# Patient Record
Sex: Female | Born: 1960
Health system: Southern US, Community
[De-identification: ages and names within clinical notes are randomized; demographics above are authoritative.]

## PROBLEM LIST (undated history)

## (undated) DIAGNOSIS — R739 Hyperglycemia, unspecified: Secondary | ICD-10-CM

## (undated) DIAGNOSIS — T7840XA Allergy, unspecified, initial encounter: Secondary | ICD-10-CM

## (undated) DIAGNOSIS — G4730 Sleep apnea, unspecified: Secondary | ICD-10-CM

## (undated) DIAGNOSIS — Z8679 Personal history of other diseases of the circulatory system: Secondary | ICD-10-CM

## (undated) DIAGNOSIS — F419 Anxiety disorder, unspecified: Secondary | ICD-10-CM

## (undated) DIAGNOSIS — I10 Essential (primary) hypertension: Secondary | ICD-10-CM

## (undated) DIAGNOSIS — Z5189 Encounter for other specified aftercare: Secondary | ICD-10-CM

## (undated) DIAGNOSIS — A630 Anogenital (venereal) warts: Secondary | ICD-10-CM

## (undated) DIAGNOSIS — D649 Anemia, unspecified: Secondary | ICD-10-CM

## (undated) HISTORY — DX: Essential (primary) hypertension: I10

## (undated) HISTORY — DX: Hyperglycemia, unspecified: R73.9

## (undated) HISTORY — PX: ABDOMINAL HYSTERECTOMY: SHX81

## (undated) HISTORY — DX: Sleep apnea, unspecified: G47.30

## (undated) HISTORY — DX: Personal history of other diseases of the circulatory system: Z86.79

## (undated) HISTORY — DX: Allergy, unspecified, initial encounter: T78.40XA

## (undated) HISTORY — PX: CARPAL TUNNEL RELEASE: SHX101

## (undated) HISTORY — PX: CHOLECYSTECTOMY: SHX55

## (undated) HISTORY — DX: Anemia, unspecified: D64.9

## (undated) HISTORY — DX: Encounter for other specified aftercare: Z51.89

## (undated) HISTORY — DX: Anogenital (venereal) warts: A63.0

## (undated) HISTORY — PX: COLONOSCOPY: SHX174

## (undated) HISTORY — PX: BREAST EXCISIONAL BIOPSY: SUR124

## (undated) HISTORY — PX: BREAST SURGERY: SHX581

---

## 2001-01-25 ENCOUNTER — Emergency Department (HOSPITAL_COMMUNITY): Admission: EM | Admit: 2001-01-25 | Discharge: 2001-01-25 | Payer: Self-pay | Admitting: Emergency Medicine

## 2002-02-06 ENCOUNTER — Emergency Department (HOSPITAL_COMMUNITY): Admission: EM | Admit: 2002-02-06 | Discharge: 2002-02-06 | Payer: Self-pay | Admitting: Emergency Medicine

## 2006-06-27 ENCOUNTER — Encounter: Payer: Self-pay | Admitting: Internal Medicine

## 2006-06-27 ENCOUNTER — Ambulatory Visit: Payer: Self-pay | Admitting: Internal Medicine

## 2006-07-03 DIAGNOSIS — I10 Essential (primary) hypertension: Secondary | ICD-10-CM | POA: Insufficient documentation

## 2006-07-03 DIAGNOSIS — I1A0 Resistant hypertension: Secondary | ICD-10-CM | POA: Insufficient documentation

## 2006-07-09 ENCOUNTER — Encounter (INDEPENDENT_AMBULATORY_CARE_PROVIDER_SITE_OTHER): Payer: Self-pay | Admitting: *Deleted

## 2006-07-09 LAB — CONVERTED CEMR LAB
Chloride: 104 meq/L (ref 96–112)
MCV: 89.6 fL (ref 78.0–100.0)
Platelets: 344 10*3/uL (ref 150–400)
Potassium: 3.8 meq/L (ref 3.5–5.3)
RDW: 13.8 % (ref 11.5–14.0)
TSH: 1.578 microintl units/mL (ref 0.350–5.50)

## 2006-07-25 ENCOUNTER — Ambulatory Visit: Payer: Self-pay | Admitting: Internal Medicine

## 2006-07-25 LAB — CONVERTED CEMR LAB
Bilirubin Urine: NEGATIVE
Glucose, Urine, Semiquant: NEGATIVE
Nitrite: NEGATIVE
Protein, U semiquant: NEGATIVE
Specific Gravity, Urine: 1.02
Urobilinogen, UA: NEGATIVE
WBC Urine, dipstick: NEGATIVE

## 2006-07-30 ENCOUNTER — Telehealth (INDEPENDENT_AMBULATORY_CARE_PROVIDER_SITE_OTHER): Payer: Self-pay | Admitting: *Deleted

## 2006-08-01 ENCOUNTER — Encounter: Payer: Self-pay | Admitting: Internal Medicine

## 2006-08-01 ENCOUNTER — Encounter (INDEPENDENT_AMBULATORY_CARE_PROVIDER_SITE_OTHER): Payer: Self-pay | Admitting: *Deleted

## 2006-08-08 ENCOUNTER — Ambulatory Visit: Payer: Self-pay | Admitting: Internal Medicine

## 2006-08-20 ENCOUNTER — Ambulatory Visit: Payer: Self-pay | Admitting: Internal Medicine

## 2006-08-22 ENCOUNTER — Encounter (INDEPENDENT_AMBULATORY_CARE_PROVIDER_SITE_OTHER): Payer: Self-pay | Admitting: *Deleted

## 2006-08-22 LAB — CONVERTED CEMR LAB
CO2: 30 meq/L (ref 19–32)
GFR calc Af Amer: 86 mL/min

## 2007-01-09 ENCOUNTER — Encounter: Payer: Self-pay | Admitting: Internal Medicine

## 2007-02-16 ENCOUNTER — Encounter: Payer: Self-pay | Admitting: Internal Medicine

## 2007-03-18 ENCOUNTER — Telehealth (INDEPENDENT_AMBULATORY_CARE_PROVIDER_SITE_OTHER): Payer: Self-pay | Admitting: *Deleted

## 2007-03-18 ENCOUNTER — Encounter (INDEPENDENT_AMBULATORY_CARE_PROVIDER_SITE_OTHER): Payer: Self-pay | Admitting: *Deleted

## 2007-04-15 ENCOUNTER — Ambulatory Visit: Payer: Self-pay | Admitting: Internal Medicine

## 2007-04-15 ENCOUNTER — Encounter: Payer: Self-pay | Admitting: Internal Medicine

## 2007-04-15 ENCOUNTER — Other Ambulatory Visit: Admission: RE | Admit: 2007-04-15 | Discharge: 2007-04-15 | Payer: Self-pay | Admitting: Internal Medicine

## 2007-04-15 DIAGNOSIS — A630 Anogenital (venereal) warts: Secondary | ICD-10-CM

## 2007-04-15 LAB — CONVERTED CEMR LAB
Ketones, urine, test strip: NEGATIVE
Protein, U semiquant: NEGATIVE
pH: 7.5

## 2007-04-16 ENCOUNTER — Encounter: Payer: Self-pay | Admitting: Internal Medicine

## 2007-04-20 ENCOUNTER — Encounter: Payer: Self-pay | Admitting: Internal Medicine

## 2007-04-20 ENCOUNTER — Encounter (INDEPENDENT_AMBULATORY_CARE_PROVIDER_SITE_OTHER): Payer: Self-pay | Admitting: *Deleted

## 2007-04-20 ENCOUNTER — Telehealth (INDEPENDENT_AMBULATORY_CARE_PROVIDER_SITE_OTHER): Payer: Self-pay | Admitting: *Deleted

## 2007-04-22 ENCOUNTER — Encounter (INDEPENDENT_AMBULATORY_CARE_PROVIDER_SITE_OTHER): Payer: Self-pay | Admitting: *Deleted

## 2007-04-28 ENCOUNTER — Encounter: Payer: Self-pay | Admitting: Internal Medicine

## 2007-04-29 ENCOUNTER — Ambulatory Visit: Payer: Self-pay | Admitting: Internal Medicine

## 2007-04-29 ENCOUNTER — Encounter (INDEPENDENT_AMBULATORY_CARE_PROVIDER_SITE_OTHER): Payer: Self-pay | Admitting: *Deleted

## 2007-05-05 ENCOUNTER — Telehealth (INDEPENDENT_AMBULATORY_CARE_PROVIDER_SITE_OTHER): Payer: Self-pay | Admitting: *Deleted

## 2007-05-05 DIAGNOSIS — D649 Anemia, unspecified: Secondary | ICD-10-CM

## 2007-05-05 LAB — CONVERTED CEMR LAB
ALT: 14 units/L (ref 0–35)
AST: 15 units/L (ref 0–37)
Basophils Absolute: 0 10*3/uL (ref 0.0–0.1)
Basophils Relative: 0.3 % (ref 0.0–1.0)
Bilirubin, Direct: 0.1 mg/dL (ref 0.0–0.3)
Creatinine, Ser: 0.7 mg/dL (ref 0.4–1.2)
Eosinophils Absolute: 0.1 10*3/uL (ref 0.0–0.6)
Eosinophils Relative: 1.7 % (ref 0.0–5.0)
GFR calc non Af Amer: 95 mL/min
HDL: 39.4 mg/dL (ref >39.0)
Hemoglobin: 11.1 g/dL — ABNORMAL LOW (ref 12.0–15.0)
MCV: 91.9 fL (ref 78.0–100.0)
Potassium: 3.6 meq/L (ref 3.5–5.1)
RBC: 3.7 M/uL — ABNORMAL LOW (ref 3.87–5.11)
RDW: 12.7 % (ref 11.5–14.6)
Total CHOL/HDL Ratio: 4.4
Total Protein: 6.8 g/dL (ref 6.0–8.3)
WBC: 5.1 10*3/uL (ref 4.5–10.5)

## 2007-05-12 ENCOUNTER — Encounter (INDEPENDENT_AMBULATORY_CARE_PROVIDER_SITE_OTHER): Payer: Self-pay | Admitting: *Deleted

## 2007-05-15 ENCOUNTER — Telehealth (INDEPENDENT_AMBULATORY_CARE_PROVIDER_SITE_OTHER): Payer: Self-pay | Admitting: *Deleted

## 2007-05-27 ENCOUNTER — Ambulatory Visit: Payer: Self-pay | Admitting: Internal Medicine

## 2007-06-02 ENCOUNTER — Ambulatory Visit: Payer: Self-pay | Admitting: Gastroenterology

## 2007-06-12 ENCOUNTER — Telehealth: Payer: Self-pay | Admitting: Gastroenterology

## 2007-06-12 DIAGNOSIS — D649 Anemia, unspecified: Secondary | ICD-10-CM

## 2007-06-12 HISTORY — DX: Anemia, unspecified: D64.9

## 2007-06-18 ENCOUNTER — Ambulatory Visit: Payer: Self-pay | Admitting: Gastroenterology

## 2007-06-18 ENCOUNTER — Encounter: Payer: Self-pay | Admitting: Internal Medicine

## 2007-06-18 LAB — HM COLONOSCOPY

## 2007-06-19 ENCOUNTER — Encounter: Payer: Self-pay | Admitting: Gastroenterology

## 2007-06-22 ENCOUNTER — Telehealth: Payer: Self-pay | Admitting: Gastroenterology

## 2007-07-10 ENCOUNTER — Ambulatory Visit: Payer: Self-pay | Admitting: Internal Medicine

## 2007-07-13 LAB — CONVERTED CEMR LAB
BUN: 9 mg/dL (ref 6–23)
Chloride: 107 meq/L (ref 96–112)
Creatinine, Ser: 0.9 mg/dL (ref 0.40–1.20)
Potassium: 3.9 meq/L (ref 3.5–5.3)

## 2007-07-16 ENCOUNTER — Ambulatory Visit: Payer: Self-pay | Admitting: Internal Medicine

## 2007-07-16 LAB — CONVERTED CEMR LAB
Fecal Occult Blood: NEGATIVE
OCCULT 1: NEGATIVE
OCCULT 3: NEGATIVE
OCCULT 5: NEGATIVE

## 2007-12-03 ENCOUNTER — Telehealth (INDEPENDENT_AMBULATORY_CARE_PROVIDER_SITE_OTHER): Payer: Self-pay | Admitting: *Deleted

## 2007-12-11 ENCOUNTER — Ambulatory Visit: Payer: Self-pay | Admitting: Internal Medicine

## 2007-12-11 ENCOUNTER — Observation Stay (HOSPITAL_COMMUNITY): Admission: EM | Admit: 2007-12-11 | Discharge: 2007-12-12 | Payer: Self-pay | Admitting: Internal Medicine

## 2007-12-25 ENCOUNTER — Encounter: Payer: Self-pay | Admitting: Internal Medicine

## 2007-12-28 ENCOUNTER — Ambulatory Visit: Payer: Self-pay | Admitting: Internal Medicine

## 2007-12-28 DIAGNOSIS — R7309 Other abnormal glucose: Secondary | ICD-10-CM

## 2007-12-29 ENCOUNTER — Encounter (INDEPENDENT_AMBULATORY_CARE_PROVIDER_SITE_OTHER): Payer: Self-pay | Admitting: *Deleted

## 2007-12-31 ENCOUNTER — Telehealth (INDEPENDENT_AMBULATORY_CARE_PROVIDER_SITE_OTHER): Payer: Self-pay | Admitting: *Deleted

## 2007-12-31 LAB — CONVERTED CEMR LAB
CO2: 30 meq/L (ref 19–32)
Calcium: 9.2 mg/dL (ref 8.4–10.5)
Creatinine, Ser: 0.8 mg/dL (ref 0.4–1.2)
Hgb A1c MFr Bld: 5.7 % (ref 4.6–6.0)

## 2008-01-14 ENCOUNTER — Ambulatory Visit: Payer: Self-pay

## 2008-01-14 ENCOUNTER — Encounter: Payer: Self-pay | Admitting: Internal Medicine

## 2008-03-01 ENCOUNTER — Encounter: Payer: Self-pay | Admitting: Internal Medicine

## 2008-03-03 ENCOUNTER — Telehealth (INDEPENDENT_AMBULATORY_CARE_PROVIDER_SITE_OTHER): Payer: Self-pay | Admitting: *Deleted

## 2008-03-14 ENCOUNTER — Encounter (INDEPENDENT_AMBULATORY_CARE_PROVIDER_SITE_OTHER): Payer: Self-pay | Admitting: *Deleted

## 2008-04-26 ENCOUNTER — Ambulatory Visit: Payer: Self-pay | Admitting: Internal Medicine

## 2008-04-26 DIAGNOSIS — J309 Allergic rhinitis, unspecified: Secondary | ICD-10-CM | POA: Insufficient documentation

## 2008-05-27 ENCOUNTER — Ambulatory Visit: Payer: Self-pay | Admitting: Internal Medicine

## 2008-07-04 ENCOUNTER — Ambulatory Visit: Payer: Self-pay

## 2008-08-02 ENCOUNTER — Encounter: Payer: Self-pay | Admitting: Internal Medicine

## 2008-08-02 ENCOUNTER — Encounter (INDEPENDENT_AMBULATORY_CARE_PROVIDER_SITE_OTHER): Payer: Self-pay | Admitting: *Deleted

## 2008-08-02 ENCOUNTER — Ambulatory Visit: Payer: Self-pay | Admitting: Internal Medicine

## 2008-08-02 DIAGNOSIS — M79609 Pain in unspecified limb: Secondary | ICD-10-CM

## 2008-08-02 DIAGNOSIS — R05 Cough: Secondary | ICD-10-CM

## 2008-08-03 ENCOUNTER — Telehealth: Payer: Self-pay | Admitting: Internal Medicine

## 2008-08-08 LAB — CONVERTED CEMR LAB
ALT: 12 units/L (ref 0–35)
Hemoglobin: 11.9 g/dL — ABNORMAL LOW (ref 12.0–15.0)
Hgb A1c MFr Bld: 5.6 % (ref 4.6–6.5)
Lymphocytes Relative: 38.9 % (ref 12.0–46.0)
MCHC: 34.1 g/dL (ref 30.0–36.0)
Monocytes Absolute: 0.4 10*3/uL (ref 0.1–1.0)
Neutro Abs: 2.7 10*3/uL (ref 1.4–7.7)
Neutrophils Relative %: 51.5 % (ref 43.0–77.0)
RDW: 12.8 % (ref 11.5–14.6)
Total Bilirubin: 0.9 mg/dL (ref 0.3–1.2)
Total CHOL/HDL Ratio: 4
Total Protein: 7.3 g/dL (ref 6.0–8.3)
Triglycerides: 50 mg/dL (ref 0.0–149.0)

## 2008-08-09 ENCOUNTER — Ambulatory Visit: Payer: Self-pay | Admitting: Internal Medicine

## 2008-08-12 LAB — CONVERTED CEMR LAB: Folate: 5.9 ng/mL

## 2008-08-17 ENCOUNTER — Ambulatory Visit: Payer: Self-pay | Admitting: Emergency Medicine

## 2008-08-17 DIAGNOSIS — R042 Hemoptysis: Secondary | ICD-10-CM | POA: Insufficient documentation

## 2008-08-19 ENCOUNTER — Ambulatory Visit: Payer: Self-pay | Admitting: Internal Medicine

## 2008-08-22 ENCOUNTER — Telehealth (INDEPENDENT_AMBULATORY_CARE_PROVIDER_SITE_OTHER): Payer: Self-pay | Admitting: *Deleted

## 2008-08-22 LAB — CONVERTED CEMR LAB
BUN: 13 mg/dL (ref 6–23)
Chloride: 104 meq/L (ref 96–112)
Creatinine, Ser: 0.8 mg/dL (ref 0.4–1.2)
GFR calc non Af Amer: 97.77 mL/min (ref >60)

## 2008-09-02 ENCOUNTER — Ambulatory Visit: Admission: RE | Admit: 2008-09-02 | Discharge: 2008-09-02 | Payer: Self-pay | Admitting: Emergency Medicine

## 2008-09-02 ENCOUNTER — Encounter: Payer: Self-pay | Admitting: Emergency Medicine

## 2008-09-02 ENCOUNTER — Ambulatory Visit: Payer: Self-pay | Admitting: Emergency Medicine

## 2008-09-08 ENCOUNTER — Telehealth: Payer: Self-pay | Admitting: Emergency Medicine

## 2008-09-09 ENCOUNTER — Telehealth (INDEPENDENT_AMBULATORY_CARE_PROVIDER_SITE_OTHER): Payer: Self-pay | Admitting: *Deleted

## 2008-09-09 ENCOUNTER — Encounter: Payer: Self-pay | Admitting: Emergency Medicine

## 2009-01-03 ENCOUNTER — Telehealth (INDEPENDENT_AMBULATORY_CARE_PROVIDER_SITE_OTHER): Payer: Self-pay | Admitting: *Deleted

## 2009-01-17 ENCOUNTER — Ambulatory Visit (HOSPITAL_BASED_OUTPATIENT_CLINIC_OR_DEPARTMENT_OTHER): Admission: RE | Admit: 2009-01-17 | Discharge: 2009-01-17 | Payer: Self-pay | Admitting: Internal Medicine

## 2009-01-17 ENCOUNTER — Ambulatory Visit: Payer: Self-pay | Admitting: Family

## 2009-01-17 ENCOUNTER — Ambulatory Visit: Payer: Self-pay | Admitting: Radiology

## 2009-01-17 ENCOUNTER — Telehealth: Payer: Self-pay | Admitting: Family

## 2009-01-17 DIAGNOSIS — R1011 Right upper quadrant pain: Secondary | ICD-10-CM | POA: Insufficient documentation

## 2009-01-17 LAB — CONVERTED CEMR LAB
AST: 14 units/L (ref 0–37)
Albumin: 4.3 g/dL (ref 3.5–5.2)
Alkaline Phosphatase: 64 units/L (ref 39–117)
Bilirubin, Direct: 0.1 mg/dL (ref 0.0–0.3)
Lipase: 21 units/L (ref 0–75)
MCHC: 32.6 g/dL (ref 30.0–36.0)
MCV: 89.3 fL (ref 78.0–100.0)
Platelets: 348 10*3/uL (ref 150–400)
RBC: 4.02 M/uL (ref 3.87–5.11)
RDW: 13.2 % (ref 11.5–15.5)

## 2009-01-20 ENCOUNTER — Telehealth: Payer: Self-pay | Admitting: Family

## 2009-02-17 ENCOUNTER — Encounter (INDEPENDENT_AMBULATORY_CARE_PROVIDER_SITE_OTHER): Payer: Self-pay | Admitting: *Deleted

## 2009-02-17 ENCOUNTER — Ambulatory Visit: Payer: Self-pay | Admitting: Family

## 2009-02-17 LAB — CONVERTED CEMR LAB
Folate: 15.6 ng/mL
Glucose, Bld: 87 mg/dL (ref 70–99)
Iron: 59 ug/dL (ref 42–145)
Potassium: 4.1 meq/L (ref 3.5–5.3)
Saturation Ratios: 20 % (ref 20–55)
Sodium: 142 meq/L (ref 135–145)
TIBC: 302 ug/dL (ref 250–470)
UIBC: 243 ug/dL
Vitamin B-12: 396 pg/mL (ref 211–911)

## 2009-02-20 ENCOUNTER — Encounter: Payer: Self-pay | Admitting: Family

## 2009-03-02 ENCOUNTER — Ambulatory Visit: Payer: Self-pay | Admitting: Family

## 2009-03-02 LAB — CONVERTED CEMR LAB: Fecal Occult Bld: NEGATIVE

## 2009-04-05 ENCOUNTER — Emergency Department (HOSPITAL_COMMUNITY): Admission: EM | Admit: 2009-04-05 | Discharge: 2009-04-05 | Payer: Self-pay | Admitting: Emergency Medicine

## 2009-04-05 ENCOUNTER — Ambulatory Visit: Payer: Self-pay | Admitting: Family

## 2009-04-05 DIAGNOSIS — R0789 Other chest pain: Secondary | ICD-10-CM | POA: Insufficient documentation

## 2009-06-01 ENCOUNTER — Telehealth: Payer: Self-pay | Admitting: Family

## 2009-06-26 ENCOUNTER — Encounter: Payer: Self-pay | Admitting: Family

## 2009-07-26 ENCOUNTER — Ambulatory Visit (HOSPITAL_COMMUNITY): Admission: RE | Admit: 2009-07-26 | Discharge: 2009-07-26 | Payer: Self-pay | Admitting: Surgery

## 2009-08-07 ENCOUNTER — Telehealth: Payer: Self-pay | Admitting: Family

## 2009-08-09 ENCOUNTER — Ambulatory Visit: Payer: Self-pay | Admitting: Family

## 2009-08-09 DIAGNOSIS — Z9089 Acquired absence of other organs: Secondary | ICD-10-CM | POA: Insufficient documentation

## 2009-08-09 DIAGNOSIS — R040 Epistaxis: Secondary | ICD-10-CM | POA: Insufficient documentation

## 2009-08-09 LAB — CONVERTED CEMR LAB
Basophils Absolute: 0 10*3/uL (ref 0.0–0.1)
Hemoglobin: 11.6 g/dL — ABNORMAL LOW (ref 12.0–15.0)
Monocytes Relative: 7 % (ref 3–12)
Neutro Abs: 3.3 10*3/uL (ref 1.7–7.7)
Neutrophils Relative %: 47 % (ref 43–77)
Platelets: 376 10*3/uL (ref 150–400)
RBC: 4 M/uL (ref 3.87–5.11)

## 2009-08-10 ENCOUNTER — Telehealth: Payer: Self-pay | Admitting: Family

## 2010-03-13 NOTE — Letter (Signed)
   [facility] [address] [place], [place]  [address] Phone: [phone]      February 20, 2009   [facility] [address] [place], [place] [address]  RE:  LAB RESULTS  Dear  Ms. Gaspard,  The following is an interpretation of your most recent lab tests.  Please take note of any instructions provided or changes to medications that have resulted from your lab work.  ELECTROLYTES:  Good - no changes needed    DIABETIC STUDIES:  Excellent - no changes needed Blood Glucose: 87   HgbA1C: 5.6    You anemia studies are normal.   Sincerely Yours,    Lemont Fillers FNP

## 2010-03-13 NOTE — Progress Notes (Signed)
Summary: disability form; needs appt  Phone Note Outgoing Call   Summary of Call: Please call patient and let her know thatwe have a disability form for her.  Please have her arrange a follow up visit here in the office this week.   Initial call taken by: Lemont Fillers FNP,  August 07, 2009 8:58 AM  Follow-up for Phone Call        Unable to reach pt. at home. Could not leave message on voicemail.  Mervin Kung CMA  August 07, 2009 9:39 AM   Additional Follow-up for Phone Call Additional follow up Details #1::        Spoke with pt and scheduled f/u with Lovinia Snare for 08/09/09 @ 10:30am.  Mervin Kung CMA  August 08, 2009 1:26 PM

## 2010-03-13 NOTE — Assessment & Plan Note (Signed)
Summary: FOLLOWUP/JS   Vital Signs:  Patient profile:   50 year old female Weight:      243 pounds Pulse rate:   85 / minute BP sitting:   188 / 118  (right arm)  Vitals Entered By: Doristine Devoid (February 17, 2009 2:22 PM) CC: f/u    Primary Care Provider:  Dr. Drue Novel  CC:  f/u .  History of Present Illness: Ms Jennifer [name] is a 50 year old female who presents today for follow up.  Was in the first week of December with c/o RUQ pain.  Abdominal US noted cholilithiasis without ductal dilatation.   Patient reports to me that she is no longer experiencing abdominal pain  Anemia- Patient tells me this is not a new problem.  She is s/p hysterectomy.  Patient had a colonoscopy 2 years ago- diverticulosis. Denies black stools or BRBPR  Allergies: 1)  ! Codeine  Review of Systems       no melena, no BRBPR  Physical Exam  General:  Well-developed,well-nourished,in no acute distress; alert,appropriate and cooperative throughout examination Head:  Normocephalic and atraumatic without obvious abnormalities. No apparent alopecia or balding. Ears:  External ear exam shows no significant lesions or deformities.  Otoscopic examination reveals clear canals, tympanic membranes are intact bilaterally without bulging, retraction, inflammation or discharge. Hearing is grossly normal bilaterally. Neck:  No deformities, masses, or tenderness noted. Lungs:  Normal respiratory effort, chest expands symmetrically. Lungs are clear to auscultation, no crackles or wheezes. Heart:  Normal rate and regular rhythm. S1 and S2 normal without gallop, murmur, click, rub or other extra sounds.   Impression & Recommendations:  Problem # 1:  ABDOMINAL PAIN, RIGHT UPPER QUADRANT (ICD-789.01) Assessment Improved Pain resolved.  Patient instructed to call if symptoms recur.  Would consider HIDA scan at that time.  Problem # 2:  ANEMIA (ICD-285.9) Assessment: Comment Only  will order iFOB, will also order anemia  panel,  S/p hysterectomy  Orders: T-Iron ([id number]) T-Iron Binding Capacity (TIBC) ([address]) TLB-B12 + Folate Pnl ([id number]/OZD)  Problem # 3:  HYPERTENSION (ICD-401.9) Assessment: Deteriorated  Patient did not take meds this am- urged patient to take immediately upon returning home.  Compliance reinforced.  Follow up in 2 weeks Her updated medication list for this problem includes:    Carvedilol 25 Mg Tabs (Carvedilol) .Marland Kitchen... 1 by mouth two times a day    Cozaar 50 Mg Tabs (Losartan potassium) .Marland Kitchen... 1 by mouth once daily    Hydrochlorothiazide 25 Mg Tabs (Hydrochlorothiazide) .Marland Kitchen... 1 by mouth every am  BP today: 188/118 Prior BP: 140/90 (01/17/2009)  Prior 10 Yr Risk Heart Disease: 8 % (07/04/2008)  Labs Reviewed: K+: 3.7 (08/19/2008) Creat: : 0.8 (08/19/2008)   Chol: 193 (08/02/2008)   HDL: 49.20 (08/02/2008)   LDL: 134 (08/02/2008)   TG: 50.0 (08/02/2008)  Orders: T-Basic Metabolic Panel [phone])  Complete Medication List: 1)  Carvedilol 25 Mg Tabs (Carvedilol) .Marland Kitchen.. 1 by mouth two times a day 2)  Cozaar 50 Mg Tabs (Losartan potassium) .Marland Kitchen.. 1 by mouth once daily 3)  Hydrochlorothiazide 25 Mg Tabs (Hydrochlorothiazide) .Marland Kitchen.. 1 by mouth every am 4)  Klor-con M20 20 Meq Cr-tabs (Potassium chloride crys cr) .Marland Kitchen.. 1 by mouth qd 5)  Alavert 10 Mg Tabs (Loratadine) .... As needed 6)  Womens Multivitamin Plus Tabs (Multiple vitamins-minerals) .... One tablet by mouth daily  Patient Instructions: 1)  Please complete the stool cards, 2)  Take your blood pressure meds EVERY DAY! 3)  Please schedule  a follow-up appointment in 2 weeks.

## 2010-03-13 NOTE — Progress Notes (Signed)
Summary: STD form  Phone Note Other Incoming   Summary of Call: Pt brought short term disability form from [employer] for laparoscopic cholecystectomy on 07/26/09. Pt was seen, form completed and faxed to [phone] @ 4:26pm. Form sent to be scanned.  Mervin Kung CMA  August 10, 2009 4:39 PM

## 2010-03-13 NOTE — Letter (Signed)
   [facility] [address] [place], [place] [address] [phone]    March 02, 2009   [facility] [address] [place], [place] [address]  RE:  LAB RESULTS  Dear  Ms. Demauro,  The following is an interpretation of your most recent lab tests.  Please take note of any instructions provided or changes to medications that have resulted from your lab work.  Hemoccult Test for blood in stool:  negative      Sincerely Yours,    Lemont Fillers FNP

## 2010-03-13 NOTE — Assessment & Plan Note (Signed)
Summary: chest tightning, H/A- jr   Vital Signs:  Patient profile:   50 year old female Menstrual status:  hysterectomy Weight:      248.88 pounds BMI:     45.32 Temp:     98.1 degrees F oral Pulse rate:   68 / minute Pulse rhythm:   regular Resp:     16 per minute BP sitting:   140 / 90  (right arm) Cuff size:   large CC: room 4  Rt sided chest pressure Is Patient Diabetic? No Comments Also wants evaluation of top left foot pain for approximately 3 weeks. No injuries that pt is aware of.   Primary Care Provider:  Dr. Drue [name]  CC:  room 4  Rt sided chest pressure.  History of Present Illness: Jennifer [name] is a 50 year old female who presents today with c/o chest pressure since she got out of bed this AM.   Feels like "something is sitting on my chest".  Notes that pain is substernal or slightly right of substernal.  Pain radiates to her back when she moves her right shoulder.  Pain is not made worse by exertion.  Denies shortness of breath.  She took an aleve this am which helped temporarily.  Rates current pain as 5/10.  Of note, she had a neg stress test 5/10.  Notes mild nausea without associated vomitting.  Notes + HA at the top of her head daily x 1 week.  Denies cough or associated fever.    Preventive Screening-Counseling & Management  Alcohol-Tobacco     Smoking Status: quit  Allergies: 1)  ! Codeine  Past History:  Past Medical History: Last updated: 08/02/2008 11-09 CHEST PAIN, neg ECHO  , neg stress test 06-2008 HYPERTENSION   ALLERGIC RHINITIS  HYPERGLYCEMIA, BORDERLINE .Marland KitchenMarland KitchenA1c 5.7 12-2007 h/o CONDYLOMA ACUMINATUM   anemia--  Cscope 5-09 : tics, neg hemocults 6-09  Past Surgical History: Last updated: 08/02/2008 Caesarean section FIBROID TUMOR REMOVED-RIGHT BREAST age 49  Hysterectomy, no oophorectomy   Family History: Last updated: 08/02/2008 CAD - F, uncle, GM, sis HTN - GM, F, sis, 3 bro DM - F, M, MGM, PGM, aunts, sis colon Ca - no breast Ca -  aunt stroke - PGM, MGM, uncle, bro hypothyroid - aunt hyperthyroid - aunt prostate Ca - F glaucoma - GM  Social History: Last updated: 08/17/2008 Pt. is single with 3 children Former Smoker quit aprox 2006.  Pt smoked 1/2ppd x51yrs. Alcohol use-no Drug use-no Regular exercise-no caffeine - 1 by mouth once daily Pt works for [employer].  Risk Factors: Exercise: no (08/02/2008)  Risk Factors: Smoking Status: quit (04/05/2009)  Family History: Reviewed history from 08/02/2008 and no changes required. CAD - F, uncle, GM, sis HTN - GM, F, sis, 3 bro DM - F, M, MGM, PGM, aunts, sis colon Ca - no breast Ca - aunt stroke - PGM, MGM, uncle, bro hypothyroid - aunt hyperthyroid - aunt prostate Ca - F glaucoma - GM  Social History: Reviewed history from 08/17/2008 and no changes required. Pt. is single with 3 children Former Smoker quit aprox 2006.  Pt smoked 1/2ppd x12yrs. Alcohol use-no Drug use-no Regular exercise-no caffeine - 1 by mouth once daily Pt works for [employer].  Physical Exam  General:  Well-developed,well-nourished,in no acute distress; alert,appropriate and cooperative throughout examination Head:  Normocephalic and atraumatic without obvious abnormalities. No apparent alopecia or balding. Eyes:  PERRLA Mouth:  Oral mucosa and oropharynx without lesions or exudates.  Teeth  in good repair. Neck:  No deformities, masses, or tenderness noted. Chest Wall:  + tenderness to palpation- greatest over right breast. Lungs:  Normal respiratory effort, chest expands symmetrically. Lungs are clear to auscultation, no crackles or wheezes. Heart:  Normal rate and regular rhythm. S1 and S2 normal without gallop, murmur, click, rub or other extra sounds. Abdomen:  Bowel sounds positive,abdomen soft and non-tender without masses, organomegaly or hernias noted. No RUQ tenderness   Impression & Recommendations:  Problem # 1:  CHEST PAIN, ATYPICAL  (ICD-786.59) Assessment New Patient with several risk factors for CAD,  (HTN, obesity, age, hyperglycemia).  Reviewed EKG and compared to previous EKG-  Patient appears to have mild ST elevation in leads V3 andV4 which is new.  [facility] exercise stress test May 2010.  I spoke with Dr. Kerry Hough with [facility] and he has requested that patient be sent through the emergency department for further evaluation in ED prior to admission.  I also discussed the case with [place] cardiology at Dr. Benetta Spar request.  Spoke with Dr. Riley Kill and notified him that patient was [facility] ED.  Greater than 25 minutes were spent, greater than 50% of time spent on counselling and coordination of her care.    Complete Medication List: 1)  Carvedilol 25 Mg Tabs (Carvedilol) .Marland Kitchen.. 1 by mouth two times a day 2)  Cozaar 50 Mg Tabs (Losartan potassium) .Marland Kitchen.. 1 by mouth once daily 3)  Hydrochlorothiazide 25 Mg Tabs (Hydrochlorothiazide) .Marland Kitchen.. 1 by mouth every am 4)  Klor-con M20 20 Meq Cr-tabs (Potassium chloride crys cr) .Marland Kitchen.. 1 by mouth qd 5)  Alavert 10 Mg Tabs (Loratadine) .... As needed 6)  Womens Multivitamin Plus Tabs (Multiple vitamins-minerals) .... One tablet by mouth daily 7)  Aleve 220 Mg Tabs (Naproxen sodium) .... As needed  Other Orders: EKG w/ Interpretation (93000)  Patient Instructions: 1)  Please go directly to the [facility] Emergency Department for further evaluation.    Current Allergies (reviewed today): ! CODEINE

## 2010-03-13 NOTE — Letter (Signed)
Summary: [facility]: Immunoassay Fecal Occult Blood (iFOB) Order Psychologist, counselling at [facility]  [address]   [place], [place] [address]   Phone: [phone]  Fax: [phone]      [facility]: Immunoassay Fecal Occult Blood (iFOB) Order Form   February 17, 2009 MRN: [id number]   BRUNETTE LAVALLE 21-Dec-1960   Physicican Name:________Melissa O'Sullivan,FNP______  Diagnosis Code:__________285.9________________      Doristine Devoid

## 2010-03-13 NOTE — Consult Note (Signed)
Summary: [facility]   Imported By: Lanelle Bal 07/18/2009 12:56:00  _____________________________________________________________________  External Attachment:    Type:   Image     Comment:   External Document

## 2010-03-13 NOTE — Assessment & Plan Note (Signed)
Summary: needs disability form completed / tf,cma--Rm 4   Vital Signs:  Patient profile:   50 year old female Menstrual status:  hysterectomy Height:      62.25 inches Weight:      236 pounds BMI:     42.97 Temp:     98.9 degrees F oral Pulse rate:   66 / minute Pulse rhythm:   regular Resp:     16 per minute BP sitting:   140 / 96  (right arm) Cuff size:   large  Vitals Entered By: Mervin Kung CMA (August 09, 2009 10:30 AM)  CC: Room 4   Pt has short term disability papers to be completed. Also had nosebleed last night and this a.m. Is Patient Diabetic? No   Primary Care Provider:  Dr. Drue Novel  CC:  Room 4   Pt has short term disability papers to be completed. Also had nosebleed last night and this a.m..  History of Present Illness: Jennifer [name] is a 50 year old female who presents today for follow up from her laparoscopic cholecystectomy which was perfomed n 6/15.   Patient has appointment on 08/18/09 with surgery and plans to return to work after that time.  She works as in Administrator, arts.  Today she notes 2 episodes of epistaxis in the last 24 hours.  Denies any recent sinus issues.   She ultimately stopped the bleeding with use of an ice pack and pressure.  The first episode of bleeding lasted 45 minutes.   Surgeon's office has a copy of form.    Patient was previously being followed at the [facility] office.  She tells me that she would like to transfer her care to the [facility] office.  Allergies: 1)  ! Codeine  Past History:  Past Medical History: Last updated: 08/02/2008 11-09 CHEST PAIN, neg ECHO  , neg stress test 06-2008 HYPERTENSION   ALLERGIC RHINITIS  HYPERGLYCEMIA, BORDERLINE .Marland KitchenMarland KitchenA1c 5.7 12-2007 h/o CONDYLOMA ACUMINATUM   anemia--  Cscope 5-09 : tics, neg hemocults 6-09  Past Surgical History: Last updated: 08/02/2008 Caesarean section FIBROID TUMOR REMOVED-RIGHT BREAST age 41  Hysterectomy, no oophorectomy   Family History: Last updated:  08/02/2008 CAD - F, uncle, GM, sis HTN - GM, F, sis, 3 bro DM - F, M, MGM, PGM, aunts, sis colon Ca - no breast Ca - aunt stroke - PGM, MGM, uncle, bro hypothyroid - aunt hyperthyroid - aunt prostate Ca - F glaucoma - GM  Social History: Last updated: 08/17/2008 Pt. is single with 3 children Former Smoker quit aprox 2006.  Pt smoked 1/2ppd x3yrs. Alcohol use-no Drug use-no Regular exercise-no caffeine - 1 by mouth once daily Pt works for [employer].  Risk Factors: Exercise: no (08/02/2008)  Risk Factors: Smoking Status: quit (04/05/2009)  Physical Exam  General:  Well-developed,well-nourished,in no acute distress; alert,appropriate and cooperative throughout examination Lungs:  Normal respiratory effort, chest expands symmetrically. Lungs are clear to auscultation, no crackles or wheezes. Heart:  Normal rate and regular rhythm. S1 and S2 normal without gallop, murmur, click, rub or other extra sounds. Abdomen:  Bowel sounds positive,abdomen soft and non-tender without masses, organomegaly or hernias noted.  Surgical incisions well approximated, clean, no drainage or erythema is noted. Extremities:  No clubbing, cyanosis, edema, or deformity noted with normal full range of motion of all joints.     Impression & Recommendations:  Problem # 1:  EPISTAXIS (ICD-784.7) Assessment New Recommended humidfier and nasal saline spray.  Patient instructed to go to  the ED if she develops recurrent epistaxis which does not resolve with pressure.  Check CBC.  Will consider referral to ENT if this becomes a recurrent problem for patient.    Problem # 2:  CHOLECYSTECTOMY, LAPAROSCOPIC, HX OF (ICD-V45.79)  Clinically well post-op.  Short term disability form filled for her employer.  Orders: Form Completion ([address]409)  Complete Medication List: 1)  Carvedilol 25 Mg Tabs (Carvedilol) .Marland Kitchen.. 1 by mouth two times a day 2)  Cozaar 50 Mg Tabs (Losartan potassium) .Marland Kitchen.. 1 by mouth once  daily 3)  Hydrochlorothiazide 25 Mg Tabs (Hydrochlorothiazide) .Marland Kitchen.. 1 by mouth every am 4)  Klor-con M20 20 Meq Cr-tabs (Potassium chloride crys cr) .Marland Kitchen.. 1 by mouth qd 5)  Alavert 10 Mg Tabs (Loratadine) .... As needed 6)  Womens Multivitamin Plus Tabs (Multiple vitamins-minerals) .... One tablet by mouth daily 7)  Aleve 220 Mg Tabs (Naproxen sodium) .... As needed  Other Orders: T-CBC w/Diff [phone])  Patient Instructions: 1)  Go to the ER if you develop recurrent nose bleed that you are unable to stop. 2)  Try adding a humidifier to your bedroom. 3)  Use a nasal saline spray twice daily to both nostrils. 4)  Follow up in 2 months. 5)  We will forward your disability paperwork to your employer.  Prescriptions: KLOR-CON M20 20 MEQ CR-TABS (POTASSIUM CHLORIDE CRYS CR) 1 by mouth qd  #90 x 1   Entered and Authorized by:   Lemont Fillers FNP   Signed by:   Lemont Fillers FNP on 08/09/2009   Method used:   Electronically to        [facility] [address] (retail)       [phone]       [place], [place]  [address]       Ph: [phone]       Fax: [phone]   RxID:   [id number] HYDROCHLOROTHIAZIDE 25 MG  TABS (HYDROCHLOROTHIAZIDE) 1 by mouth every AM  #30 Each x 3   Entered and Authorized by:   Lemont Fillers FNP   Signed by:   Lemont Fillers FNP on 08/09/2009   Method used:   Electronically to        [facility] [address] (retail)       [phone]       [place], [place]  [address]       Ph: [phone]       Fax: [phone]   RxID:   [id number] COZAAR 50 MG TABS (LOSARTAN POTASSIUM) 1 by mouth once daily  #30 x 3   Entered and Authorized by:   Lemont Fillers FNP   Signed by:   Lemont Fillers FNP on 08/09/2009   Method used:   Electronically to        [facility] [address] (retail)       [phone]       [place], [place]  [address]       Ph: [phone]       Fax: [phone]   RxID:   [id number] CARVEDILOL 25 MG TABS (CARVEDILOL) 1 by mouth two times a day  #60 x 3   Entered and Authorized by:   Lemont Fillers FNP   Signed by:   Lemont Fillers FNP on 08/09/2009   Method  used:   Tax adviser to        [facility] (retail)       [phone]       [place], [place]  [address]       Ph: [phone]       Fax: [phone]   RxID:   [id number]   Current Allergies (reviewed today): ! CODEINE

## 2010-03-13 NOTE — Progress Notes (Signed)
  Phone Note Call from Patient   Caller: Patient Details for Reason: SURGERY Summary of Call: PTwould like to schedule her GB   surgery -   [phone]     still having lot of pain on & off Initial call taken by: Darral Dash,  June 01, 2009 11:45 AM  Follow-up for Phone Call        Pt notes that she has had abdominal pain RUQ last night with headache.  Still with some mild abdominal discomfort.  Reports pain is consistent with pain during  previous gall bladder flare-ups.  Pt with known history of cholelithiasis.  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  I instructed patient to go to the ER over the weekend if worsening abdominal pain, nausea, vomitting of fever.  She verbalizes understanding.  Follow-up by: Lemont Fillers FNP,  June 01, 2009 12:10 PM

## 2010-04-27 ENCOUNTER — Encounter: Payer: Self-pay | Admitting: Family

## 2010-04-27 ENCOUNTER — Ambulatory Visit (INDEPENDENT_AMBULATORY_CARE_PROVIDER_SITE_OTHER): Payer: Managed Care, Other (non HMO) | Admitting: Family

## 2010-04-27 DIAGNOSIS — N951 Menopausal and female climacteric states: Secondary | ICD-10-CM

## 2010-04-27 DIAGNOSIS — I10 Essential (primary) hypertension: Secondary | ICD-10-CM

## 2010-04-27 LAB — CONVERTED CEMR LAB
BUN: 16 mg/dL (ref 6–23)
Chloride: 104 meq/L (ref 96–112)
FSH: 71 milliintl units/mL
Glucose, Bld: 111 mg/dL — ABNORMAL HIGH (ref 70–99)
LH: 34.1 milliintl units/mL
Potassium: 3.6 meq/L (ref 3.5–5.3)
Sodium: 140 meq/L (ref 135–145)

## 2010-04-28 LAB — BASIC METABOLIC PANEL
BUN: 16 mg/dL (ref 6–23)
Creat: 0.77 mg/dL (ref 0.40–1.20)
Glucose, Bld: 111 mg/dL — ABNORMAL HIGH (ref 70–99)
Potassium: 3.6 mEq/L (ref 3.5–5.3)

## 2010-04-28 LAB — FOLLICLE STIMULATING HORMONE: FSH: 71 m[IU]/mL

## 2010-04-30 LAB — COMPREHENSIVE METABOLIC PANEL
Albumin: 3.7 g/dL (ref 3.5–5.2)
BUN: 11 mg/dL (ref 6–23)
Chloride: 104 mEq/L (ref 96–112)
Creatinine, Ser: 0.91 mg/dL (ref 0.4–1.2)
Glucose, Bld: 117 mg/dL — ABNORMAL HIGH (ref 70–99)
Sodium: 140 mEq/L (ref 135–145)

## 2010-04-30 LAB — DIFFERENTIAL
Basophils Absolute: 0 10*3/uL (ref 0.0–0.1)
Monocytes Absolute: 0.5 10*3/uL (ref 0.1–1.0)
Monocytes Relative: 9 % (ref 3–12)

## 2010-04-30 LAB — CBC: MCHC: 33.7 g/dL (ref 30.0–36.0)

## 2010-04-30 LAB — SURGICAL PCR SCREEN: MRSA, PCR: NEGATIVE

## 2010-05-01 NOTE — Assessment & Plan Note (Signed)
Summary: f/u of blood pressure--rm 5   Vital Signs:  Patient profile:   50 year old female Menstrual status:  hysterectomy Height:      62.25 inches Weight:      236 pounds BMI:     42.97 Temp:     98.1 degrees F oral Pulse rate:   84 / minute Pulse rhythm:   regular Resp:     16 per minute BP sitting:   162 / 96  (right arm) Cuff size:   large  Vitals Entered By: Mervin Kung CMA Duncan Dull) (April 27, 2010 1:28 PM) CC: Pt here for follow up of blood pressure. , Hypertension Management Is Patient Diabetic? No Pain Assessment Patient in pain? no        Primary Care Provider:  Lemont Fillers FNP  CC:  Pt here for follow up of blood pressure.  and Hypertension Management.  History of Present Illness: Pt is a 50 year old female who presents today for follow up of her blood pressure.  HTN- taking HCTZ, and carvidilol- but ran out of the carvedilol 3 days ago.  Never started the cozaar  Hot flashes- night sweats.  These symptom started getting worse last 1 year.    Hypertension History:      She denies chest pain, palpitations, and peripheral edema.  Further comments include: occasional HA's when she is hungry.  Marland Kitchen        Positive major cardiovascular risk factors include hypertension and family history for ischemic heart disease (males less than 44 years old).  Negative major cardiovascular risk factors include female age less than 67 years old, no history of diabetes or hyperlipidemia, and non-tobacco-user status.        Further assessment for target organ damage reveals no history of ASHD, cardiac end-organ damage (CHF/LVH), stroke/TIA, peripheral vascular disease, renal insufficiency, or hypertensive retinopathy.     Preventive Screening-Counseling & Management  Alcohol-Tobacco     Smoking Status: quit     Year Quit: 2006  Allergies: 1)  ! Codeine  Family History: CAD - F, uncle, GM, sis HTN - GM, F, sis, 3 bro DM - F, M, MGM, PGM, aunts, sis colon Ca -  no breast Ca - aunt- dad's side. was 29 years old when she diedl cousin- died of cervical cancer at age 5. stroke - PGM, MGM, uncle, bro hypothyroid - aunt hyperthyroid - aunt prostate Ca - F glaucoma - GM  Review of Systems       see history of present illness  Physical Exam  General:  Well-developed,well-nourished,in no acute distress; alert,appropriate and cooperative throughout examination Head:  Normocephalic and atraumatic without obvious abnormalities. No apparent alopecia or balding. Lungs:  Normal respiratory effort, chest expands symmetrically. Lungs are clear to auscultation, no crackles or wheezes. Heart:  Normal rate and regular rhythm. S1 and S2 normal without gallop, murmur, click, rub or other extra sounds. Extremities:  No peripheral edema   Impression & Recommendations:  Problem # 1:  HYPERTENSION (ICD-401.9) Assessment Deteriorated Plan to resume carvedilol. She reports that she never started the Cozaar. We can consider resuming this at a later date if necessary. Followup in one month. The following medications were removed from the medication list:    Cozaar 50 Mg Tabs (Losartan potassium) .Marland Kitchen... 1 by mouth once daily Her updated medication list for this problem includes:    Carvedilol 25 Mg Tabs (Carvedilol) .Marland Kitchen... 1 by mouth two times a day    Hydrochlorothiazide 25 Mg  Tabs (Hydrochlorothiazide) .Marland Kitchen... 1 by mouth every am  Orders: TLB-BMP (Basic Metabolic Panel-BMET) (80048-METABOL)  BP today: 162/96 Prior BP: 140/96 (08/09/2009)  10 Yr Risk Heart Disease: 13 % Prior 10 Yr Risk Heart Disease: 8 % (07/04/2008)  Labs Reviewed: K+: 4.1 (02/17/2009) Creat: : 0.84 (02/17/2009)   Chol: 193 (08/02/2008)   HDL: 49.20 (08/02/2008)   LDL: 134 (08/02/2008)   TG: 50.0 (08/02/2008)  Problem # 2:  HOT FLASHES (ICD-627.2) Assessment: New plan to check labs as below. She has a significant family history of early breast cancer. I would be reluctant to treat her with  HRT. Orders: T-FSH [id number]) T-LH [id number]) T-Estradiol [id number])  Complete Medication List: 1)  Carvedilol 25 Mg Tabs (Carvedilol) .Marland Kitchen.. 1 by mouth two times a day 2)  Hydrochlorothiazide 25 Mg Tabs (Hydrochlorothiazide) .Marland Kitchen.. 1 by mouth every am 3)  Klor-con M20 20 Meq Cr-tabs (Potassium chloride crys cr) .Marland Kitchen.. 1 by mouth qd 4)  Alavert 10 Mg Tabs (Loratadine) .... As needed 5)  Womens Multivitamin Plus Tabs (Multiple vitamins-minerals) .... One tablet by mouth daily 6)  Aleve 220 Mg Tabs (Naproxen sodium) .... As needed  Hypertension Assessment/Plan:      The patient's hypertensive risk group is category B: At least one risk factor (excluding diabetes) with no target organ damage.  Her calculated 10 year risk of coronary heart disease is 13 %.  Today's blood pressure is 162/96.  Her blood pressure goal is < 140/90.  Patient Instructions: 1)  Please complete your lab work on the first floor today. 2)  Please schedule a follow up appointment in 1 month for a complete physical.  Come fasting to this appointment.  Prescriptions: KLOR-CON M20 20 MEQ CR-TABS (POTASSIUM CHLORIDE CRYS CR) 1 by mouth qd  #30 x 1   Entered and Authorized by:   Lemont Fillers FNP   Signed by:   Lemont Fillers FNP on 04/27/2010   Method used:   Electronically to        [facility]* (retail)       [phone]       [place], [place]  [address]       Ph: [phone]       Fax: [phone]   RxID:   [id number] CARVEDILOL 25 MG TABS (CARVEDILOL) 1 by mouth two times a day  #60 Each x 0   Entered and Authorized by:   Lemont Fillers FNP   Signed by:   Lemont Fillers FNP on 04/27/2010   Method used:   Electronically to        [facility] (retail)       [phone]       [place], [place]  [address]       Ph: [phone]       Fax: [phone]   RxID:   [id number] HYDROCHLOROTHIAZIDE 25 MG  TABS  (HYDROCHLOROTHIAZIDE) 1 by mouth every AM  #30 Each x 3   Entered and Authorized by:   Lemont Fillers FNP   Signed by:   Lemont Fillers FNP on 04/27/2010   Method used:   Electronically to        [facility] (retail)       [phone]       [place], [place]  [address]  Ph: [phone]       Fax: [phone]   RxID:   [id number]    Orders Added: 1)  TLB-BMP (Basic Metabolic Panel-BMET) [80048-METABOL] 2)  T-FSH [62952-84132] 3)  T-LH [83002-23680] 4)  T-Estradiol [82670-82425] 5)  Est. Patient Level III [[address]]    Current Allergies (reviewed today): ! CODEINE

## 2010-05-02 LAB — POCT CARDIAC MARKERS: CKMB, poc: <1 ng/mL — ABNORMAL LOW (ref 1.0–8.0)

## 2010-05-02 LAB — POCT I-STAT, CHEM 8
BUN: 12 mg/dL (ref 6–23)
Creatinine, Ser: 0.8 mg/dL (ref 0.4–1.2)
Glucose, Bld: 118 mg/dL — ABNORMAL HIGH (ref 70–99)
Hemoglobin: 12.6 g/dL (ref 12.0–15.0)
TCO2: 29 mmol/L (ref 0–100)

## 2010-05-06 LAB — ESTRADIOL, FREE

## 2010-05-20 ENCOUNTER — Telehealth: Payer: Self-pay | Admitting: Family

## 2010-06-01 ENCOUNTER — Encounter: Payer: Self-pay | Admitting: Family

## 2010-06-04 ENCOUNTER — Encounter: Payer: Self-pay | Admitting: Family

## 2010-06-04 ENCOUNTER — Ambulatory Visit (INDEPENDENT_AMBULATORY_CARE_PROVIDER_SITE_OTHER): Payer: Managed Care, Other (non HMO) | Admitting: Family

## 2010-06-04 ENCOUNTER — Ambulatory Visit (HOSPITAL_BASED_OUTPATIENT_CLINIC_OR_DEPARTMENT_OTHER)
Admission: RE | Admit: 2010-06-04 | Discharge: 2010-06-04 | Disposition: A | Discharge status: Home / Self Care | Payer: Managed Care, Other (non HMO) | Source: Ambulatory Visit | Attending: Family | Admitting: Family

## 2010-06-04 DIAGNOSIS — R232 Flushing: Secondary | ICD-10-CM

## 2010-06-04 DIAGNOSIS — M25539 Pain in unspecified wrist: Secondary | ICD-10-CM

## 2010-06-04 DIAGNOSIS — Z1231 Encounter for screening mammogram for malignant neoplasm of breast: Secondary | ICD-10-CM | POA: Insufficient documentation

## 2010-06-04 DIAGNOSIS — M2550 Pain in unspecified joint: Secondary | ICD-10-CM | POA: Insufficient documentation

## 2010-06-04 DIAGNOSIS — Z0000 Encounter for general adult medical examination without abnormal findings: Secondary | ICD-10-CM

## 2010-06-04 DIAGNOSIS — N951 Menopausal and female climacteric states: Secondary | ICD-10-CM

## 2010-06-04 DIAGNOSIS — M25579 Pain in unspecified ankle and joints of unspecified foot: Secondary | ICD-10-CM

## 2010-06-04 LAB — CBC WITH DIFFERENTIAL/PLATELET
Basophils Absolute: 0 10*3/uL (ref 0.0–0.1)
Basophils Relative: 0 % (ref 0–1)
HCT: 37.5 % (ref 36.0–46.0)
Lymphocytes Relative: 37 % (ref 12–46)
MCHC: 32.8 g/dL (ref 30.0–36.0)
Monocytes Absolute: 0.4 10*3/uL (ref 0.1–1.0)
Neutro Abs: 3.2 10*3/uL (ref 1.7–7.7)
Neutrophils Relative %: 54 % (ref 43–77)
Platelets: 325 10*3/uL (ref 150–400)
RDW: 12.9 % (ref 11.5–15.5)
WBC: 5.9 10*3/uL (ref 4.0–10.5)

## 2010-06-04 LAB — SEDIMENTATION RATE: Sed Rate: 21 mm/hr (ref 0–22)

## 2010-06-04 MED ORDER — CITALOPRAM HYDROBROMIDE 10 MG PO TABS: 10.0000 mg | ORAL_TABLET | Freq: Every day | ORAL | Status: DC

## 2010-06-04 MED ORDER — POTASSIUM CHLORIDE CRYS ER 20 MEQ PO TBCR: 20.0000 meq | EXTENDED_RELEASE_TABLET | Freq: Every day | ORAL | Status: DC

## 2010-06-04 MED ORDER — CARVEDILOL 25 MG PO TABS: 25.0000 mg | ORAL_TABLET | Freq: Two times a day (BID) | ORAL | Status: DC

## 2010-06-04 MED ORDER — HYDROCHLOROTHIAZIDE 25 MG PO TABS: 25.0000 mg | ORAL_TABLET | ORAL | Status: DC

## 2010-06-04 NOTE — Progress Notes (Signed)
Subjective:    Patient ID: Jennifer [name], female    DOB: [birth year], 50 y.o.   MRN: [id number]  HPI  Preventive-  Exercise- she reports that she dances on Sundays.  Last tetanus 2008.  Last colonoscopy was in 2009.  Last mammogram 2009. She is s/p partial hysterectomy.  Hot Flashes-She continues to have hot flashes.  These can occur at any time of the day.  Joint swelling/pain- notes that she had bilateral wrist and ankle pain/swelling which has resolved. She denies hx of gout.    Review of Systems  Constitutional: Negative for fever, chills, fatigue and unexpected weight change.  HENT: Negative for hearing loss.   Eyes: Negative for visual disturbance.  Respiratory: Negative for cough and chest tightness.   Cardiovascular: Negative for chest pain, palpitations and leg swelling.  Gastrointestinal: Negative for vomiting, diarrhea, constipation and blood in stool.  Genitourinary: Positive for hematuria. Negative for dysuria and frequency.  Musculoskeletal: Positive for back pain and joint swelling.       She reports that she hx of of bilateral wrist and ankle pain and swelling which has resolved.    Skin: Negative for rash.  Neurological: Negative for weakness and numbness.  Hematological: Negative for adenopathy.  Psychiatric/Behavioral:       Denies depression, + anxiety at times- i.e. Traffic, certain people.   Past Medical History  Diagnosis Date  . Chest pain 12/2007    negative ECHO, neg stress test 06/2008  . Hypertension   . Allergy     allergic rhinitis  . Hyperglycemia     borderline. A1c 5.7  12/2007  . Condyloma acuminatum     history of  . Anemia 06/2007    C-scope 06/2007: tics, neg hemocults 07/2007    History   Social History  . Marital Status: Single    Spouse Name: N/A    Number of Children: 3  . Years of Education: N/A   Occupational History  .  [employer]   Social History Main Topics  . Smoking status: Former Smoker -- 0.5 packs/day for  27 years    Types: Cigarettes    Quit date: 02/12/2004  . Smokeless tobacco: Never Used  . Alcohol Use: No  . Drug Use: No  . Sexually Active: Not on file   Other Topics Concern  . Not on file   Social History Narrative   Regular exercise  NoCaffeine--    Past Surgical History  Procedure Date  . Cesarean section   . Abdominal hysterectomy     no oophorectomy  . Breast surgery age 60    Fibroid tumor removed right breast    Family History  Problem Relation Age of Onset  . Diabetes Mother   . Coronary artery disease Father   . Kidney disease Father   . Diabetes Father   . Cancer Father     prostate  . Coronary artery disease Sister   . Kidney disease Sister   . Diabetes Sister   . Kidney disease Brother   . Stroke Brother   . Cancer Paternal Aunt     breast  . Diabetes Maternal Grandmother   . Stroke Maternal Grandmother   . Diabetes Paternal Grandmother   . Stroke Paternal Grandfather   . Coronary artery disease Other     grandmother, uncle  . Kidney disease Other     grandmother  . Stroke Other     uncle  . Thyroid disease Other     aunt--hyperthyroid;  aunt--hypothyroid  . Glaucoma Other     grandmother  . Diabetes Other     aunts  . Cancer Cousin     cervical    Allergies  Allergen Reactions  . Codeine     REACTION: nausea    Current Outpatient Prescriptions on File Prior to Visit  Medication Sig Dispense Refill  . loratadine (ALAVERT) 10 MG tablet Take 10 mg by mouth as needed.        . Multiple Vitamins-Minerals (WOMENS MULTIVITAMIN PLUS) TABS Take 1 tablet by mouth daily.        . naproxen sodium (ANAPROX) 220 MG tablet Take 220 mg by mouth as needed.        Marland Kitchen DISCONTD: carvedilol (COREG) 25 MG tablet Take 25 mg by mouth 2 (two) times daily with a meal.        . DISCONTD: hydrochlorothiazide 25 MG tablet Take 25 mg by mouth every morning.        Marland Kitchen DISCONTD: potassium chloride (KLOR-CON) 20 MEQ packet Take 20 mEq by mouth daily.           BP 120/80  Pulse 72  Temp(Src) 97.8 F (36.6 C) (Oral)  Resp 16  Ht 5' 2.24" (1.581 m)  Wt 237 lb 0.6 oz (107.521 kg)  BMI 43.02 kg/m2       Objective:   Physical Exam  Constitutional: She is oriented to person, place, and time. She appears well-developed.       Pleasant, overweight AA female.  HENT:  Head: Normocephalic and atraumatic.  Eyes: Pupils are equal, round, and reactive to light.  Neck: Normal range of motion. Neck supple. No tracheal deviation present. No thyromegaly present.  Cardiovascular: Normal rate and regular rhythm.  Exam reveals no friction rub.   No murmur heard. Pulmonary/Chest: Effort normal and breath sounds normal.  Abdominal: Soft. Bowel sounds are normal.  Genitourinary: Vagina normal. No breast swelling, tenderness, discharge or bleeding.       Uterus surgically absent.  No adnexal fullness is noted.   Musculoskeletal: Normal range of motion. She exhibits no edema and no tenderness.  Lymphadenopathy:    She has no cervical adenopathy.  Neurological: She is alert and oriented to person, place, and time. She has normal reflexes. No cranial nerve deficit. She exhibits normal muscle tone. Coordination normal.  Skin: Skin is warm.  Psychiatric: She has a normal mood and affect. Her behavior is normal. Thought content normal.          Assessment & Plan:

## 2010-06-04 NOTE — Patient Instructions (Signed)
Please complete your lab work on the first floor. Work hard on diet, exercise and weight loss. Try to exercise at least 20 minutes a day.

## 2010-06-04 NOTE — Assessment & Plan Note (Signed)
Lab work last visit consistent with menopause.  Pt has family hx of early breast CA- will avoid HRT.  Will plan to treat her with low dose citalopram to see if this helps control her symptoms.  Pt was instructed to call if she develops worsening depression while on citalopram.

## 2010-06-04 NOTE — Assessment & Plan Note (Signed)
Pt with episode of joint pain/swelling.  Will check for RA and gout.

## 2010-06-04 NOTE — Assessment & Plan Note (Signed)
Pt counseled on diet, exercise and weight loss.  Immunizations reviewed and up to date.  Pt referred for mammogram.  Colo up to date.

## 2010-06-05 LAB — RHEUMATOID FACTOR: Rhuematoid fact SerPl-aCnc: <10 IU/mL (ref <=14)

## 2010-06-05 LAB — TSH: TSH: 1.803 u[IU]/mL (ref 0.350–4.500)

## 2010-06-06 ENCOUNTER — Encounter: Payer: Self-pay | Admitting: Family

## 2010-06-06 LAB — BASIC METABOLIC PANEL
Calcium: 9.9 mg/dL (ref 8.4–10.5)
Glucose, Bld: 102 mg/dL — ABNORMAL HIGH (ref 70–99)
Potassium: 4.2 mEq/L (ref 3.5–5.3)
Sodium: 140 mEq/L (ref 135–145)

## 2010-06-06 LAB — LIPID PANEL
HDL: 49 mg/dL (ref >39)
LDL Cholesterol: 139 mg/dL — ABNORMAL HIGH (ref 0–99)
Total CHOL/HDL Ratio: 4.1 Ratio
VLDL: 12 mg/dL (ref 0–40)

## 2010-06-06 LAB — HEPATIC FUNCTION PANEL
AST: 17 U/L (ref 0–37)
Albumin: 4.3 g/dL (ref 3.5–5.2)
Alkaline Phosphatase: 65 U/L (ref 39–117)
Indirect Bilirubin: 0.4 mg/dL (ref 0.0–0.9)
Total Bilirubin: 0.5 mg/dL (ref 0.3–1.2)

## 2010-06-26 NOTE — Letter (Signed)
June 02, 2007    Willow Ora, MD  [phone] [place], [place] [address]   RE:  TENNIE, GRUSSING  MRN:  [id number]  /  DOB:  08/27/1960   Dear Dr. Drue Novel:   Upon your kind referral, I had the pleasure of evaluating your patient  and I am pleased to offer my findings.  I saw Jennifer [name] in the office  today.  Enclosed is a copy of my progress note that details my findings  and recommendations.   Thank you for the opportunity to participate in your patient's care.    Sincerely,      Barbette Hair. Arlyce Dice, MD,FACG  Electronically Signed    RDK/MedQ  DD: 06/02/2007  DT: 06/02/2007  Job #: [id number]
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
## 2010-06-26 NOTE — Assessment & Plan Note (Signed)
[facility]                         GASTROENTEROLOGY OFFICE NOTE   Jennifer [name], Jennifer [name]                    MRN:          [id number]  DATE:06/02/2007                            DOB:          01/18/1961    REFERRING PHYSICIAN:  Willow Ora, MD   REASON FOR CONSULTATION:  Anemia.   Jennifer [name] is a 50 year old African American female referred through  the courtesy of Dr. Drue Novel for evaluation.  Routine testing demonstrated a  mild anemia.  On April 29, 2007, hemoglobin was 11.1 and hematocrit 34.  Jennifer [name] only GI compliant is occasional stool incontinence  characterized by noting some stool when she wipes herself after  urinating.  She is unaware of having passed stool.  Bowels are erratic  with alternating constipation and diarrhea.  There is no history of  melena or hematochezia.  She is on no gastric irritants including  nonsteroidals.   PAST MEDICAL HISTORY:  1. Pertinent for hypertension.  2. She is status post hysterectomy and breast surgery.   FAMILY HISTORY:  Positive for mother and sister with diabetes.   MEDICATIONS:  Benazepril, metoprolol and HCTZ.   SHE IS ALLERGIC TO CODEINE.  She neither smokes or drinks.  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   REVIEW OF SYSTEMS:  Is pertinent for abdominal cramps and new headaches,  otherwise negative.   PHYSICAL EXAMINATION:  Pulse 72, blood pressure 134/74, weight 221.  HEENT: EOMI.  PERRLA.  Sclerae are anicteric.  Conjunctivae are pink.  NECK:  Supple without thyromegaly, adenopathy or carotid bruits.  CHEST:  Clear to auscultation and percussion without adventitious  sounds.  CARDIAC:  Regular rhythm; normal S1 S2.  There are no murmurs, gallops  or rubs.  ABDOMEN:  Bowel sounds are normoactive.  Abdomen is soft, nontender and  nondistended.  There are no abdominal masses, tenderness, splenic  enlargement or hepatomegaly.  EXTREMITIES:  Full range of motion.  No  cyanosis, clubbing or edema.  RECTAL:  She has slightly decreased rectal tone, stool is brown and  Hemoccult negative, there are no rectal masses.  SKELETAL:  There are no deformities.  NEUROLOGIC:  She is nonfocal.   IMPRESSION:  1. Normochromic, normocytic anemia.  This may be an anemia of chronic      disease which is mild.  There is no evidence at this time for GI      blood loss.  2. Fecal incontinence.  I suspect this is related to mild irritable      bowel syndrome.   RECOMMENDATIONS:  1. Colonoscopy.  2. Fiber supplementation.     Barbette Hair. Arlyce Dice, MD,FACG  Electronically Signed    RDK/MedQ  DD: 06/02/2007  DT: 06/02/2007  Job #: [id number]   cc:   Willow Ora, MD

## 2010-06-26 NOTE — Op Note (Signed)
Jennifer [name], Jennifer [name]             ACCOUNT NO.:  [id number]   MEDICAL RECORD NO.:  [id number]          PATIENT TYPE:  AMB   LOCATION:  CARD                         FACILITY[facility]   PHYSICIAN:  Leslye Peer, MD    DATE OF BIRTH:  04/12/1960   DATE OF PROCEDURE:  09/02/2008  DATE OF DISCHARGE:                               OPERATIVE REPORT   PROCEDURE:  Fiberoptic bronchoscopy.   OPERATOR:  Leslye Peer, M.D.   INDICATIONS:  Cough and hemoptysis.   CONSENT:  Informed consent was obtained from the patient, and a signed  copy is on her hospital chart.   MEDICATIONS GIVEN:  1. Fentanyl 75 mcg IV in divided doses.  2. Percent 3 mg IV in divided doses.  3. Lidocaine 1% for approximately 10 mL total to the bronchial mucosa.   PROCEDURAL DETAILS:  After informed consent was obtained and an  appropriate time-out was performed, conscious sedation was initiated as  outlined above.  The fiberoptic bronchoscope was introduced through the  left naris without difficulty.  There was cobblestoning of the posterior  pharynx with identification of a distinct, lobulated, slightly vascular  mass just anterior to and right of the epiglottis.  There was a  similarly raised but less lobulated abnormality to the left.  Glottic  biopsies were performed of both areas.  The glottic brushings were also  performed and sent for cytology.  There was no bleeding prior to biopsy.  The trachea was then intubated, and a standard airway exam was  performed.  The trachea, main carina, and bilateral mainstem bronchi  were all within normal limits.  The airway exam was normal on the right  with a clear right upper lobe, right middle lobe, and right lower lobe  airways.  Similarly, the left side exam was normal with a clear left  upper lobe lingular and left lower lobe airways.  There were no  endobronchial lesions or abnormal secretions noted.  No source of  bleeding was identified.  The patient tolerated the  procedure well.  There was no significant bleeding after the biopsies.  She returned to  the recovery room in good condition.   SAMPLES:  1. Glottic biopsies for pathology.  2. Glottic brushings for cytology.  3. Washings for cytology.   PLAN:  I will follow-up the pathology and cytology results with a Ms.  Cathlean Cower when they become available.  We will plan further evaluation and  therapy depending on those results.  She will almost certainly require  to ENT for laryngoscopy and possibly more biopsies, particularly if  these samples are nondiagnostic.      Leslye Peer, MD  Electronically Signed     RSB/MEDQ  D:  09/02/2008  T:  09/03/2008  Job:  [id number]

## 2010-06-26 NOTE — Letter (Signed)
June 02, 2007    Ms. Jennifer [name]   [name], [name]  MRN:  [id number]  /  DOB:  1960-08-02   Dear Jennifer [name]:   It is my pleasure to have treated you recently as a new patient in my  office.  I appreciate your confidence and the opportunity to participate  in your care.   Since I do have a busy inpatient endoscopy schedule and office schedule,  my office hours vary weekly.  I am, however, available for emergency  calls every day through my office.  If I cannot promptly meet an urgent  office appointment, another one of our gastroenterologists will be able  to assist you.   My well-trained staff are prepared to help you at all times.  For  emergencies after office hours, a physician from our gastroenterology  section is always available through my 24-hour answering service.   While you are under my care, I encourage discussion of your questions  and concerns, and I will be happy to return your calls as soon as I am  available.   Once again, I welcome you as a new patient and I look forward to a happy  and healthy relationship.    Sincerely,      Barbette Hair. Arlyce Dice, MD,FACG  Electronically Signed   RDK/MedQ  DD: 06/02/2007  DT: 06/02/2007  Job #: [id number]

## 2010-06-27 ENCOUNTER — Encounter: Payer: Self-pay | Admitting: Family

## 2010-06-29 ENCOUNTER — Encounter: Payer: Self-pay | Admitting: Family

## 2010-06-29 ENCOUNTER — Ambulatory Visit (INDEPENDENT_AMBULATORY_CARE_PROVIDER_SITE_OTHER): Payer: Managed Care, Other (non HMO) | Admitting: Family

## 2010-06-29 DIAGNOSIS — N61 Mastitis without abscess: Secondary | ICD-10-CM

## 2010-06-29 MED ORDER — CEPHALEXIN 500 MG PO CAPS: 500.0000 mg | ORAL_CAPSULE | Freq: Four times a day (QID) | ORAL | Status: AC

## 2010-06-29 NOTE — Patient Instructions (Signed)
Please call if you develop fever, increased pain, swelling, redness of the breast, or if you symptoms are not improved in 48-72 hours.  Follow up in 1 week.

## 2010-06-29 NOTE — Assessment & Plan Note (Signed)
We'll plan to treat with Keflex. And close monitoring. She is instructed to call if increased pain, swelling, fever or redness. Otherwise she is to followup in one week.

## 2010-06-29 NOTE — Progress Notes (Signed)
Subjective:    Patient ID: Jennifer [name], female    DOB: [birth year], 50 y.o.   MRN: [id number]  HPI 3 day history of "knot" on right breast.  "Hard and sore." Denies history of fever or drainage.  Previous history of breast cysts.     Review of Systems See HPI  Past Medical History  Diagnosis Date  . Chest pain 12/2007    negative ECHO, neg stress test 06/2008  . Hypertension   . Allergy     allergic rhinitis  . Hyperglycemia     borderline. A1c 5.7  12/2007  . Condyloma acuminatum     history of  . Anemia 06/2007    C-scope 06/2007: tics, neg hemocults 07/2007    History   Social History  . Marital Status: Single    Spouse Name: N/A    Number of Children: 3  . Years of Education: N/A   Occupational History  .  [employer]   Social History Main Topics  . Smoking status: Former Smoker -- 0.5 packs/day for 27 years    Types: Cigarettes    Quit date: 02/12/2004  . Smokeless tobacco: Never Used  . Alcohol Use: No  . Drug Use: No  . Sexually Active: Not on file   Other Topics Concern  . Not on file   Social History Narrative   Regular exercise  NoCaffeine--    Past Surgical History  Procedure Date  . Cesarean section   . Abdominal hysterectomy     no oophorectomy  . Breast surgery age 37    Fibroid tumor removed right breast    Family History  Problem Relation Age of Onset  . Diabetes Mother   . Coronary artery disease Father   . Kidney disease Father   . Diabetes Father   . Cancer Father     prostate  . Coronary artery disease Sister   . Kidney disease Sister   . Diabetes Sister   . Kidney disease Brother   . Stroke Brother   . Cancer Paternal Aunt     breast  . Diabetes Maternal Grandmother   . Stroke Maternal Grandmother   . Diabetes Paternal Grandmother   . Stroke Paternal Grandfather   . Coronary artery disease Other     grandmother, uncle  . Kidney disease Other     grandmother  . Stroke Other     uncle  . Thyroid disease  Other     aunt--hyperthyroid; aunt--hypothyroid  . Glaucoma Other     grandmother  . Diabetes Other     aunts  . Cancer Cousin     cervical    Allergies  Allergen Reactions  . Codeine     REACTION: nausea    Current Outpatient Prescriptions on File Prior to Visit  Medication Sig Dispense Refill  . carvedilol (COREG) 25 MG tablet Take 1 tablet (25 mg total) by mouth 2 (two) times daily with a meal.  60 tablet  3  . citalopram (CELEXA) 10 MG tablet Take 1 tablet (10 mg total) by mouth daily.  30 tablet  3  . hydrochlorothiazide 25 MG tablet Take 1 tablet (25 mg total) by mouth every morning.  30 tablet  3  . loratadine (ALAVERT) 10 MG tablet Take 10 mg by mouth as needed.        . Multiple Vitamins-Minerals (WOMENS MULTIVITAMIN PLUS) TABS Take 1 tablet by mouth daily.        . naproxen sodium (ANAPROX) 220  MG tablet Take 220 mg by mouth as needed.        . potassium chloride SA (K-DUR,KLOR-CON) 20 MEQ tablet Take 1 tablet (20 mEq total) by mouth daily.  30 tablet  2    BP 124/94  Pulse 78  Temp(Src) 98.2 F (36.8 C) (Oral)  Resp 18  Ht 5\' 2"  (1.575 m)  Wt 236 lb 1.9 oz (107.103 kg)  BMI 43.19 kg/m2        Objective:   Physical Exam General: Awake, alert, and in no acute distress Psychiatric: Calm and pleasant Breast: Right breast is noted to firm tender area of induration at approximately 9:00. Mild associated erythema is noted. Which is approximately equal to a golf ball.       Assessment & Plan:

## 2010-07-06 ENCOUNTER — Ambulatory Visit (INDEPENDENT_AMBULATORY_CARE_PROVIDER_SITE_OTHER): Payer: Managed Care, Other (non HMO) | Admitting: Family

## 2010-07-06 ENCOUNTER — Encounter: Payer: Self-pay | Admitting: Family

## 2010-07-06 DIAGNOSIS — N6310 Unspecified lump in the right breast, unspecified quadrant: Secondary | ICD-10-CM

## 2010-07-06 DIAGNOSIS — R232 Flushing: Secondary | ICD-10-CM

## 2010-07-06 DIAGNOSIS — N63 Unspecified lump in unspecified breast: Secondary | ICD-10-CM

## 2010-07-06 DIAGNOSIS — N61 Mastitis without abscess: Secondary | ICD-10-CM

## 2010-07-06 MED ORDER — CITALOPRAM HYDROBROMIDE 10 MG PO TABS: 10.0000 mg | ORAL_TABLET | Freq: Every day | ORAL | Status: DC

## 2010-07-06 MED ORDER — HYDROCHLOROTHIAZIDE 25 MG PO TABS: 25.0000 mg | ORAL_TABLET | ORAL | Status: DC

## 2010-07-06 NOTE — Assessment & Plan Note (Signed)
Clinically improved-  I do continue to feel a palpable mass in the right breast.  Will refer for diagnostic mammogram.  Pt to complete the keflex.

## 2010-07-06 NOTE — Patient Instructions (Signed)
Complete your keflex. You will be contacted about your diagnostic mammogram. Follow up in 2 weeks.  Take your BP med prior to your visit.

## 2010-07-06 NOTE — Progress Notes (Signed)
Subjective:    Patient ID: Jennifer [name], female    DOB: 17-Sep-1960, 50 y.o.   MRN: [id number]  HPI  Ms.  Jennifer [name] is a 50 yr old female who presents today for follow up of her right breast cellulitis.  She continues keflex.  Denies fever or drainage of galactorrea.  Area is less tender and softer.    Review of Systems See HPI  Past Medical History  Diagnosis Date  . Chest pain 12/2007    negative ECHO, neg stress test 06/2008  . Hypertension   . Allergy     allergic rhinitis  . Hyperglycemia     borderline. A1c 5.7  12/2007  . Condyloma acuminatum     history of  . Anemia 06/2007    C-scope 06/2007: tics, neg hemocults 07/2007    History   Social History  . Marital Status: Single    Spouse Name: N/A    Number of Children: 3  . Years of Education: N/A   Occupational History  .  [employer]   Social History Main Topics  . Smoking status: Former Smoker -- 0.5 packs/day for 27 years    Types: Cigarettes    Quit date: 02/12/2004  . Smokeless tobacco: Never Used  . Alcohol Use: No  . Drug Use: No  . Sexually Active: Not on file   Other Topics Concern  . Not on file   Social History Narrative   Regular exercise  NoCaffeine--    Past Surgical History  Procedure Date  . Cesarean section   . Abdominal hysterectomy     no oophorectomy  . Breast surgery age 52    Fibroid tumor removed right breast    Family History  Problem Relation Age of Onset  . Diabetes Mother   . Coronary artery disease Father   . Kidney disease Father   . Diabetes Father   . Cancer Father     prostate  . Coronary artery disease Sister   . Kidney disease Sister   . Diabetes Sister   . Kidney disease Brother   . Stroke Brother   . Cancer Paternal Aunt     breast  . Diabetes Maternal Grandmother   . Stroke Maternal Grandmother   . Diabetes Paternal Grandmother   . Stroke Paternal Grandfather   . Coronary artery disease Other     grandmother, uncle  . Kidney disease Other      grandmother  . Stroke Other     uncle  . Thyroid disease Other     aunt--hyperthyroid; aunt--hypothyroid  . Glaucoma Other     grandmother  . Diabetes Other     aunts  . Cancer Cousin     cervical    Allergies  Allergen Reactions  . Codeine     REACTION: nausea    Current Outpatient Prescriptions on File Prior to Visit  Medication Sig Dispense Refill  . carvedilol (COREG) 25 MG tablet Take 1 tablet (25 mg total) by mouth 2 (two) times daily with a meal.  60 tablet  3  . cephALEXin (KEFLEX) 500 MG capsule Take 1 capsule (500 mg total) by mouth 4 (four) times daily.  40 capsule  0  . loratadine (ALAVERT) 10 MG tablet Take 10 mg by mouth as needed.        . Multiple Vitamins-Minerals (WOMENS MULTIVITAMIN PLUS) TABS Take 1 tablet by mouth daily.        . naproxen sodium (ANAPROX) 220 MG tablet Take 220  mg by mouth as needed.        . potassium chloride SA (K-DUR,KLOR-CON) 20 MEQ tablet Take 1 tablet (20 mEq total) by mouth daily.  30 tablet  2  . DISCONTD: citalopram (CELEXA) 10 MG tablet Take 1 tablet (10 mg total) by mouth daily.  30 tablet  3  . DISCONTD: hydrochlorothiazide 25 MG tablet Take 1 tablet (25 mg total) by mouth every morning.  30 tablet  3    BP 146/90  Pulse 72  Temp(Src) 98.2 F (36.8 C) (Oral)  Resp 16  Ht 5\' 2"  (1.575 m)  Wt 236 lb 1.3 oz (107.085 kg)  BMI 43.18 kg/m2       Objective:   Physical Exam Gen:  Awake, alert and in NAD Breast:  R breast with firm mass at 9oclock. Non tender, no erythema. Psych: A and O x 3, calm and pleasant.        Assessment & Plan:

## 2010-07-11 ENCOUNTER — Encounter: Payer: Self-pay | Admitting: Family

## 2010-07-19 ENCOUNTER — Encounter: Payer: Self-pay | Admitting: Family

## 2010-07-20 ENCOUNTER — Ambulatory Visit (INDEPENDENT_AMBULATORY_CARE_PROVIDER_SITE_OTHER): Payer: Managed Care, Other (non HMO) | Admitting: Family

## 2010-07-20 ENCOUNTER — Encounter: Payer: Self-pay | Admitting: Family

## 2010-07-20 ENCOUNTER — Other Ambulatory Visit: Payer: Self-pay | Admitting: Family

## 2010-07-20 DIAGNOSIS — N951 Menopausal and female climacteric states: Secondary | ICD-10-CM

## 2010-07-20 DIAGNOSIS — N61 Mastitis without abscess: Secondary | ICD-10-CM

## 2010-07-20 DIAGNOSIS — N6310 Unspecified lump in the right breast, unspecified quadrant: Secondary | ICD-10-CM

## 2010-07-20 NOTE — Patient Instructions (Signed)
Cut your citalopram in half. Complete your mammogram on 6/14 as scheduled. Follow up in 1 month.

## 2010-07-20 NOTE — Assessment & Plan Note (Signed)
Hot flashes are resolved, however the patient is bothered by the sleepiness on citalopram. I have instructed her to cut her citalopram in half down to 5 mg by mouth daily. Follow up in one month

## 2010-07-20 NOTE — Progress Notes (Signed)
Subjective:    Patient ID: Jennifer [name], female    DOB: 09-09-1960, 50 y.o.   MRN: [id number]  HPI  Jennifer [name] is a 50 year old female who presents today for followup.  Hot flashes-  Feels sleepy on citalopram. She reports that her hot flashes have nearly resolved. She did not initially feel tired on the citalopram.  Cellulitis right breast-she completed antibiotics approximately one week ago. Since that time she notes that her breast is no longer tender, the area of inflammation has decreased in size. She has not yet completed her breast imaging.    Review of Systems  See history of present illness  Past Medical History  Diagnosis Date  . Chest pain 12/2007    negative ECHO, neg stress test 06/2008  . Hypertension   . Allergy     allergic rhinitis  . Hyperglycemia     borderline. A1c 5.7  12/2007  . Condyloma acuminatum     history of  . Anemia 06/2007    C-scope 06/2007: tics, neg hemocults 07/2007    History   Social History  . Marital Status: Single    Spouse Name: N/A    Number of Children: 3  . Years of Education: N/A   Occupational History  .  [employer]   Social History Main Topics  . Smoking status: Former Smoker -- 0.5 packs/day for 27 years    Types: Cigarettes    Quit date: 02/12/2004  . Smokeless tobacco: Never Used  . Alcohol Use: No  . Drug Use: No  . Sexually Active: Not on file   Other Topics Concern  . Not on file   Social History Narrative   Regular exercise  NoCaffeine-- 1 daily    Past Surgical History  Procedure Date  . Cesarean section   . Abdominal hysterectomy     no oophorectomy  . Breast surgery age 62    Fibroid tumor removed right breast    Family History  Problem Relation Age of Onset  . Diabetes Mother   . Coronary artery disease Father   . Kidney disease Father   . Diabetes Father   . Cancer Father     prostate  . Hypertension Father   . Coronary artery disease Sister   . Kidney disease Sister   .  Diabetes Sister   . Kidney disease Brother   . Stroke Brother   . Cancer Paternal Aunt     breast  . Diabetes Maternal Grandmother   . Stroke Maternal Grandmother   . Diabetes Paternal Grandmother   . Stroke Paternal Grandmother   . Coronary artery disease Other     grandmother, uncle  . Kidney disease Other     grandmother  . Stroke Other     uncle  . Thyroid disease Other     aunt--hyperthyroid; aunt--hypothyroid  . Glaucoma Other     grandmother  . Hypertension Other     GM, F, sis, 3 bro  . Diabetes Other     aunts  . Cancer Cousin     cervical  . Colon cancer Neg Hx     Allergies  Allergen Reactions  . Codeine     REACTION: nausea    Current Outpatient Prescriptions on File Prior to Visit  Medication Sig Dispense Refill  . carvedilol (COREG) 25 MG tablet Take 1 tablet (25 mg total) by mouth 2 (two) times daily with a meal.  60 tablet  3  . hydrochlorothiazide 25 MG tablet  Take 1 tablet (25 mg total) by mouth every morning.  30 tablet  3  . loratadine (ALAVERT) 10 MG tablet Take 10 mg by mouth as needed.        . Multiple Vitamins-Minerals (WOMENS MULTIVITAMIN PLUS) TABS Take 1 tablet by mouth daily.        . naproxen sodium (ANAPROX) 220 MG tablet Take 220 mg by mouth as needed.        . potassium chloride SA (K-DUR,KLOR-CON) 20 MEQ tablet Take 1 tablet (20 mEq total) by mouth daily.  30 tablet  2  . DISCONTD: citalopram (CELEXA) 10 MG tablet Take 1 tablet (10 mg total) by mouth daily.  30 tablet  3    BP 120/90  Pulse 90  Temp(Src) 97.8 F (36.6 C) (Oral)  Resp 16  Ht 5\' 2"  (1.575 m)  Wt 236 lb 0.6 oz (107.067 kg)  BMI 43.17 kg/m2        Objective:   Physical Exam  Constitutional: She appears well-developed and well-nourished.  Cardiovascular: Normal rate and regular rhythm.   Pulmonary/Chest: Effort normal and breath sounds normal.  Genitourinary:       Right breast is again noted to have mobile area of induration at approximately 11:00. It is no  longer tender and it is smaller in size. There is no associated erythema          Assessment & Plan:

## 2010-07-20 NOTE — Assessment & Plan Note (Signed)
She is scheduled at the breast center for a diagnostic mammogram on June 11, I instructed her to keep this appointment. I suspect that the evaluation will be benign

## 2010-07-26 ENCOUNTER — Ambulatory Visit
Admission: RE | Admit: 2010-07-26 | Discharge: 2010-07-26 | Disposition: A | Discharge status: Home / Self Care | Payer: Managed Care, Other (non HMO) | Source: Ambulatory Visit | Attending: Family | Admitting: Family

## 2010-07-26 ENCOUNTER — Other Ambulatory Visit: Payer: Self-pay | Admitting: Family

## 2010-07-26 ENCOUNTER — Other Ambulatory Visit: Payer: Self-pay | Admitting: Diagnostic Radiology

## 2010-07-26 DIAGNOSIS — N6310 Unspecified lump in the right breast, unspecified quadrant: Secondary | ICD-10-CM

## 2010-07-31 ENCOUNTER — Telehealth: Payer: Self-pay | Admitting: Family

## 2010-07-31 MED ORDER — CARVEDILOL 25 MG PO TABS: 25.0000 mg | ORAL_TABLET | Freq: Two times a day (BID) | ORAL | Status: DC

## 2010-07-31 MED ORDER — HYDROCHLOROTHIAZIDE 25 MG PO TABS: 25.0000 mg | ORAL_TABLET | ORAL | Status: DC

## 2010-07-31 MED ORDER — POTASSIUM CHLORIDE CRYS ER 20 MEQ PO TBCR: 20.0000 meq | EXTENDED_RELEASE_TABLET | Freq: Every day | ORAL | Status: DC

## 2010-07-31 NOTE — Telephone Encounter (Signed)
Refill- hydrochlorothiazide tab 25mg . 90 day supply. Refills 4  Refill- carvedilol tabs. 25mg . 90 day supply. Refills 4  Refill- citalopram hbr tabs 10mg . 90 day supply. Refills 4  Refill- pot chlor er tabs . 90 day supply. Refills 4

## 2010-07-31 NOTE — Telephone Encounter (Signed)
Per 07/20/10 office note, Citalopram was decreased to 1/2 tablet daily and pt is to f/u in 1 month. Verified with pt change of directions and advised her that once she has her f/u it can be decided if she will continue med and additional refills can be discussed at that time.  Carvedilol, HCTZ and potassium will be sent to [facility] for 90 day supply. Pt voices understanding.

## 2010-08-31 ENCOUNTER — Ambulatory Visit (INDEPENDENT_AMBULATORY_CARE_PROVIDER_SITE_OTHER): Payer: Managed Care, Other (non HMO) | Admitting: Family

## 2010-08-31 ENCOUNTER — Encounter: Payer: Self-pay | Admitting: Family

## 2010-08-31 DIAGNOSIS — N951 Menopausal and female climacteric states: Secondary | ICD-10-CM

## 2010-08-31 DIAGNOSIS — M940 Chondrocostal junction syndrome [Tietze]: Secondary | ICD-10-CM

## 2010-08-31 DIAGNOSIS — R079 Chest pain, unspecified: Secondary | ICD-10-CM

## 2010-08-31 MED ORDER — GABAPENTIN 100 MG PO CAPS: 100.0000 mg | ORAL_CAPSULE | Freq: Three times a day (TID) | ORAL | Status: DC

## 2010-08-31 NOTE — Progress Notes (Signed)
Subjective:    Patient ID: Jennifer [name], female    DOB: [birth year], 50 y.o.   MRN: [id number]  HPI  Jennifer [name] is a 50 yr old female who presents today for follow up.  1.  Hot flashes- Pt notes that her hot flashes are still improved, however the drowsiness is unchanged.  She feels that she is too sleepy to continue the citalopram.   2. Chest discomfort- Pain is located mid chest and right chest.  Discomfort "comes and goes." Discomfort can happen at rest.  No alleviating factors. Notes + anterior chest wall soreness. She has not tried any OTC meds.  Denies associated nausea, or left arm pain or shortness of breath.  She does report some LE edema.  She reports that this started after she received the mail order meds.  Had neg stress test 07/04/08.  Review of Systems See HPI  Past Medical History  Diagnosis Date  . Chest pain 12/2007    negative ECHO, neg stress test 06/2008  . Hypertension   . Allergy     allergic rhinitis  . Hyperglycemia     borderline. A1c 5.7  12/2007  . Condyloma acuminatum     history of  . Anemia 06/2007    C-scope 06/2007: tics, neg hemocults 07/2007    History   Social History  . Marital Status: Single    Spouse Name: N/A    Number of Children: 3  . Years of Education: N/A   Occupational History  .  [employer]   Social History Main Topics  . Smoking status: Former Smoker -- 0.5 packs/day for 27 years    Types: Cigarettes    Quit date: 02/12/2004  . Smokeless tobacco: Never Used  . Alcohol Use: No  . Drug Use: No  . Sexually Active: Not on file   Other Topics Concern  . Not on file   Social History Narrative   Regular exercise  NoCaffeine-- 1 daily    Past Surgical History  Procedure Date  . Cesarean section   . Abdominal hysterectomy     no oophorectomy  . Breast surgery age 56    Fibroid tumor removed right breast    Family History  Problem Relation Age of Onset  . Diabetes Mother   . Coronary artery disease Father    . Kidney disease Father   . Diabetes Father   . Cancer Father     prostate  . Hypertension Father   . Coronary artery disease Sister   . Kidney disease Sister   . Diabetes Sister   . Kidney disease Brother   . Stroke Brother   . Cancer Paternal Aunt     breast  . Diabetes Maternal Grandmother   . Stroke Maternal Grandmother   . Diabetes Paternal Grandmother   . Stroke Paternal Grandmother   . Coronary artery disease Other     grandmother, uncle  . Kidney disease Other     grandmother  . Stroke Other     uncle  . Thyroid disease Other     aunt--hyperthyroid; aunt--hypothyroid  . Glaucoma Other     grandmother  . Hypertension Other     GM, F, sis, 3 bro  . Diabetes Other     aunts  . Cancer Cousin     cervical  . Colon cancer Neg Hx     Allergies  Allergen Reactions  . Codeine     REACTION: nausea    Current Outpatient Prescriptions on  File Prior to Visit  Medication Sig Dispense Refill  . carvedilol (COREG) 25 MG tablet Take 1 tablet (25 mg total) by mouth 2 (two) times daily with a meal.  180 tablet  1  . citalopram (CELEXA) 10 MG tablet Take 10 mg by mouth daily. 1/2 tablet once daily        . hydrochlorothiazide 25 MG tablet Take 1 tablet (25 mg total) by mouth every morning.  90 tablet  1  . loratadine (ALAVERT) 10 MG tablet Take 10 mg by mouth as needed.        . Multiple Vitamins-Minerals (WOMENS MULTIVITAMIN PLUS) TABS Take 1 tablet by mouth daily.        . naproxen sodium (ANAPROX) 220 MG tablet Take 220 mg by mouth as needed.        . potassium chloride SA (K-DUR,KLOR-CON) 20 MEQ tablet Take 1 tablet (20 mEq total) by mouth daily.  90 tablet  1    BP 118/90  Pulse 72  Temp(Src) 97.8 F (36.6 C) (Oral)  Resp 16  Ht 5\' 2"  (1.575 m)  Wt 237 lb (107.502 kg)  BMI 43.35 kg/m2        Objective:   Physical Exam  Constitutional: She appears well-developed and well-nourished.  HENT:  Head: Normocephalic.  Cardiovascular: Normal rate and regular  rhythm.   Pulmonary/Chest: Effort normal and breath sounds normal.  Musculoskeletal:       + anterior chest wall tenderness to palpation. 1+ bilateral lower extremity edema.   Skin: Skin is warm and dry.          Assessment & Plan:

## 2010-08-31 NOTE — Assessment & Plan Note (Signed)
Patient with atypical chest pain symptoms. She has recovered usable anterior chest wall tenderness to palpation. EKG performed today was compared to EKG from 2011 and is essentially unchanged. She had a negative stress test in May 2010. Recommended short-term use of Aleve for the next one to 2 weeks as needed. She is instructed to go the ER she just felt worsening chest pain and she verbalizes understanding.

## 2010-08-31 NOTE — Patient Instructions (Addendum)
Citalopram 10mg - take one half tablet every other day for 1 week, then every 3rd day for 1 week then stop. You may use aleve 220mg  twice daily for the next 1-2 weeks for chest tenderness.  Go to ER if you develop worsening chest pain.  Follow up in 2 months, sooner if problems or concerns.

## 2010-08-31 NOTE — Assessment & Plan Note (Signed)
Patient is intolerant to citalopram due to somnolence. We'll have her taper off of citalopram and start gabapentin. I recommended that she start gabapentin 1-2 times daily and titrate upward to 3 times daily as tolerated.

## 2010-11-09 ENCOUNTER — Encounter: Payer: Self-pay | Admitting: Family

## 2010-11-09 ENCOUNTER — Ambulatory Visit (INDEPENDENT_AMBULATORY_CARE_PROVIDER_SITE_OTHER): Payer: Managed Care, Other (non HMO) | Admitting: Family

## 2010-11-09 DIAGNOSIS — R0789 Other chest pain: Secondary | ICD-10-CM

## 2010-11-09 DIAGNOSIS — I10 Essential (primary) hypertension: Secondary | ICD-10-CM

## 2010-11-09 DIAGNOSIS — R739 Hyperglycemia, unspecified: Secondary | ICD-10-CM

## 2010-11-09 DIAGNOSIS — N951 Menopausal and female climacteric states: Secondary | ICD-10-CM

## 2010-11-09 DIAGNOSIS — R7309 Other abnormal glucose: Secondary | ICD-10-CM

## 2010-11-09 LAB — BASIC METABOLIC PANEL
BUN: 12 mg/dL (ref 6–23)
Calcium: 8.9 mg/dL (ref 8.4–10.5)
Glucose, Bld: 101 mg/dL — ABNORMAL HIGH (ref 70–99)
Potassium: 4.4 mEq/L (ref 3.5–5.3)
Sodium: 138 mEq/L (ref 135–145)

## 2010-11-09 LAB — HEMOGLOBIN A1C: Hgb A1c MFr Bld: 5.7 % — ABNORMAL HIGH (ref <5.7)

## 2010-11-09 MED ORDER — HYDROCHLOROTHIAZIDE 25 MG PO TABS: 25.0000 mg | ORAL_TABLET | ORAL | Status: DC

## 2010-11-09 NOTE — Assessment & Plan Note (Signed)
Last visit her BP was better than today. Will have pt resume her HCTZ and will check BMET today.

## 2010-11-09 NOTE — Assessment & Plan Note (Signed)
This is improved, she is tolerating gabapentin, continue same.

## 2010-11-09 NOTE — Patient Instructions (Signed)
Please complete your blood work on the first floor.  Follow up in 3 months.  

## 2010-11-09 NOTE — Assessment & Plan Note (Signed)
Her last several blood sugars have been slightly above 100, will check A1C to further evaluation.

## 2010-11-09 NOTE — Progress Notes (Signed)
Subjective:    Patient ID: Jennifer [name], female    DOB: Jun 28, 1960, 50 y.o.   MRN: [id number]  HPI  Hot flashes- have resolved,  Using gabapentin tid.  HTN-  She reports that she ran out of HCTZ 2 days ago.  Notes some improvement in the lower extremity swelling.  Denies sob or headaches.  Chest tenderness- this is resolved.    Eye twitch- this has been going on for a few weeks- intermittently.  Denies blurred vision, or double vision.      Review of Systems See HPI  Past Medical History  Diagnosis Date  . Chest pain 12/2007    negative ECHO, neg stress test 06/2008  . Hypertension   . Allergy     allergic rhinitis  . Hyperglycemia     borderline. A1c 5.7  12/2007  . Condyloma acuminatum     history of  . Anemia 06/2007    C-scope 06/2007: tics, neg hemocults 07/2007    History   Social History  . Marital Status: Single    Spouse Name: N/A    Number of Children: 3  . Years of Education: N/A   Occupational History  .  [employer]   Social History Main Topics  . Smoking status: Former Smoker -- 0.5 packs/day for 27 years    Types: Cigarettes    Quit date: 02/12/2004  . Smokeless tobacco: Never Used  . Alcohol Use: No  . Drug Use: No  . Sexually Active: Not on file   Other Topics Concern  . Not on file   Social History Narrative   Regular exercise  NoCaffeine-- 1 daily    Past Surgical History  Procedure Date  . Cesarean section   . Abdominal hysterectomy     no oophorectomy  . Breast surgery age 31    Fibroid tumor removed right breast    Family History  Problem Relation Age of Onset  . Diabetes Mother   . Coronary artery disease Father   . Kidney disease Father   . Diabetes Father   . Cancer Father     prostate  . Hypertension Father   . Coronary artery disease Sister   . Kidney disease Sister   . Diabetes Sister   . Kidney disease Brother   . Stroke Brother   . Cancer Paternal Aunt     breast  . Diabetes Maternal Grandmother    . Stroke Maternal Grandmother   . Diabetes Paternal Grandmother   . Stroke Paternal Grandmother   . Coronary artery disease Other     grandmother, uncle  . Kidney disease Other     grandmother  . Stroke Other     uncle  . Thyroid disease Other     aunt--hyperthyroid; aunt--hypothyroid  . Glaucoma Other     grandmother  . Hypertension Other     GM, F, sis, 3 bro  . Diabetes Other     aunts  . Cancer Cousin     cervical  . Colon cancer Neg Hx     Allergies  Allergen Reactions  . Codeine     REACTION: nausea    Current Outpatient Prescriptions on File Prior to Visit  Medication Sig Dispense Refill  . carvedilol (COREG) 25 MG tablet Take 1 tablet (25 mg total) by mouth 2 (two) times daily with a meal.  180 tablet  1  . gabapentin (NEURONTIN) 100 MG capsule Take 1 capsule (100 mg total) by mouth 3 (three) times daily.  90 capsule  2  . loratadine (ALAVERT) 10 MG tablet Take 10 mg by mouth as needed.        . Multiple Vitamins-Minerals (WOMENS MULTIVITAMIN PLUS) TABS Take 1 tablet by mouth daily.        . naproxen sodium (ANAPROX) 220 MG tablet Take 220 mg by mouth as needed.        . potassium chloride SA (K-DUR,KLOR-CON) 20 MEQ tablet Take 1 tablet (20 mEq total) by mouth daily.  90 tablet  1    BP 132/98  Pulse 66  Temp(Src) 98.7 F (37.1 C) (Oral)  Resp 16  Ht 5\' 2"  (1.575 m)  Wt 242 lb (109.77 kg)  BMI 44.26 kg/m2       Objective:   Physical Exam  Constitutional: She appears well-developed and well-nourished.  HENT:  Head: Normocephalic and atraumatic.  Eyes:       No visible twitching of eyelid is noted.  Cardiovascular: Normal rate and regular rhythm.   No murmur heard. Pulmonary/Chest: Effort normal and breath sounds normal. No respiratory distress. She has no wheezes. She has no rales. She exhibits no tenderness.  Musculoskeletal:       Trace bilateral LE edema  Psychiatric: She has a normal mood and affect. Her behavior is normal. Judgment and  thought content normal.          Assessment & Plan:   BP Readings from Last 3 Encounters:  11/09/10 132/98  08/31/10 118/90  07/20/10 120/90

## 2010-11-09 NOTE — Assessment & Plan Note (Signed)
This is resolved and was likely musculoskeletal.

## 2010-11-13 ENCOUNTER — Encounter: Payer: Self-pay | Admitting: Family

## 2010-11-13 LAB — GLUCOSE, CAPILLARY: Glucose-Capillary: 213 — ABNORMAL HIGH

## 2010-11-13 LAB — PROTIME-INR
INR: 1
Prothrombin Time: 13.8

## 2010-11-13 LAB — TSH: TSH: 1.532

## 2010-11-13 LAB — CBC
MCHC: 33.1
MCV: 92.1
Platelets: 315
RBC: 4.07
RDW: 13.6

## 2010-11-13 LAB — CARDIAC PANEL(CRET KIN+CKTOT+MB+TROPI)
CK, MB: 1.6
CK, MB: 2.2
Total CK: 215 — ABNORMAL HIGH
Total CK: 243 — ABNORMAL HIGH
Troponin I: 0.01
Troponin I: 0.01

## 2010-11-13 LAB — BASIC METABOLIC PANEL
CO2: 25
Calcium: 9
Creatinine, Ser: 0.97
GFR calc Af Amer: >60
Sodium: 138

## 2011-02-01 ENCOUNTER — Telehealth: Payer: Self-pay | Admitting: Family

## 2011-02-01 MED ORDER — HYDROCHLOROTHIAZIDE 25 MG PO TABS: 25.0000 mg | ORAL_TABLET | ORAL | Status: DC

## 2011-02-01 MED ORDER — CARVEDILOL 25 MG PO TABS: 25.0000 mg | ORAL_TABLET | Freq: Two times a day (BID) | ORAL | Status: DC

## 2011-02-01 NOTE — Telephone Encounter (Signed)
Patient is being laid off for 3 months  She would like you to call in 3 months of her blood pressure meds so she can get them while she still has insurance.  hydriochlorizide 25mg  carvedilol 25mg   To [facility] in [place]

## 2011-02-11 ENCOUNTER — Telehealth: Payer: Self-pay | Admitting: Family

## 2011-02-11 DIAGNOSIS — R928 Other abnormal and inconclusive findings on diagnostic imaging of breast: Secondary | ICD-10-CM

## 2011-02-11 NOTE — Telephone Encounter (Signed)
Pls call pt and let her know that she is due for her follow up mammogram. I have ordered.

## 2011-02-11 NOTE — Telephone Encounter (Signed)
Attempted to reach pt and received message that phone number is not a valid number. Contact letter mailed to pt.

## 2011-06-29 ENCOUNTER — Other Ambulatory Visit: Payer: Self-pay | Admitting: Family

## 2011-07-01 NOTE — Telephone Encounter (Signed)
Carvedilol #60 x no refill sent to pharmacy with note that appointment is needed for further refills.

## 2011-07-10 ENCOUNTER — Other Ambulatory Visit: Payer: Self-pay | Admitting: Family

## 2011-07-10 NOTE — Telephone Encounter (Signed)
Carvedilol last sent to pharmacy on 06/29/11 #60 x no refills as pt needs office visit for further refills. Pt is past due for follow up.  Note was sent to pharmacy to have pt contact our office. Please call pt to arrange f/u.

## 2011-07-11 NOTE — Telephone Encounter (Signed)
Left detailed message informing patient that a 30 day supply of carvedilolwith no refills has been sent to pharmacy and patient needs to call back to schedule an appointment

## 2011-08-02 ENCOUNTER — Ambulatory Visit (INDEPENDENT_AMBULATORY_CARE_PROVIDER_SITE_OTHER): Payer: Managed Care, Other (non HMO) | Admitting: Family

## 2011-08-02 ENCOUNTER — Encounter: Payer: Self-pay | Admitting: Family

## 2011-08-02 VITALS — BP 130/90 | HR 69 | Temp 98.1°F | Resp 18 | Ht 62.0 in | Wt 244.1 lb

## 2011-08-02 DIAGNOSIS — I10 Essential (primary) hypertension: Secondary | ICD-10-CM

## 2011-08-02 MED ORDER — FUROSEMIDE 20 MG PO TABS: 20.0000 mg | ORAL_TABLET | Freq: Every day | ORAL | Status: DC

## 2011-08-02 NOTE — Patient Instructions (Addendum)
Please follow up in 2 weeks

## 2011-08-02 NOTE — Assessment & Plan Note (Signed)
BP is improved, but she is bothered by the persistent LE edema.  Plan d/c hctz, start furosemide, continue Kdur, follow up in 2 weeks for bp check and bmet.

## 2011-08-02 NOTE — Progress Notes (Signed)
  Subjective:    Patient ID: Jennifer [name], female    DOB: 16-Jul-1960, 51 y.o.   MRN: [id number]  HPI  Jennifer [name] presents today for follow up of her HTN-  She reports that she is tolerating the hctz.  She reports that she has some left ankle swelling which is "about the same." Notes that the swelling bothers her though- "it's tight."    Review of Systems See HPI    Objective:   Physical Exam  Constitutional: She appears well-developed and well-nourished.  Cardiovascular: Normal rate and regular rhythm.   No murmur heard. Pulmonary/Chest: Effort normal and breath sounds normal. No respiratory distress. She has no wheezes. She has no rales. She exhibits no tenderness.  Musculoskeletal:       2+ bilateral LE edema          Assessment & Plan:   BP Readings from Last 3 Encounters:  08/02/11 130/90  11/09/10 132/98  08/31/10 118/90

## 2011-08-16 ENCOUNTER — Ambulatory Visit (INDEPENDENT_AMBULATORY_CARE_PROVIDER_SITE_OTHER): Payer: Managed Care, Other (non HMO) | Admitting: Family

## 2011-08-16 ENCOUNTER — Encounter: Payer: Self-pay | Admitting: Family

## 2011-08-16 VITALS — BP 150/110 | HR 71 | Temp 98.1°F | Resp 16 | Ht 62.0 in | Wt 241.1 lb

## 2011-08-16 DIAGNOSIS — I10 Essential (primary) hypertension: Secondary | ICD-10-CM

## 2011-08-16 LAB — BASIC METABOLIC PANEL
BUN: 11 mg/dL (ref 6–23)
CO2: 27 mEq/L (ref 19–32)
Calcium: 9.5 mg/dL (ref 8.4–10.5)
Chloride: 103 mEq/L (ref 96–112)
Creat: 0.79 mg/dL (ref 0.50–1.10)
Glucose, Bld: 93 mg/dL (ref 70–99)

## 2011-08-16 MED ORDER — CARVEDILOL 25 MG PO TABS: 25.0000 mg | ORAL_TABLET | Freq: Two times a day (BID) | ORAL | Status: DC

## 2011-08-16 MED ORDER — FUROSEMIDE 20 MG PO TABS: 20.0000 mg | ORAL_TABLET | Freq: Every day | ORAL | Status: DC

## 2011-08-16 NOTE — Patient Instructions (Addendum)
Please complete lab work prior to leaving.  Please schedule a follow up appointment in 2 months.

## 2011-08-16 NOTE — Progress Notes (Signed)
Subjective:    Patient ID: Jennifer [name], female    DOB: 08-23-1960, 51 y.o.   MRN: [id number]  HPI  Ms.  Osentoski presents today for follow up of her HTN and LE edema.  Last visit HCTZ was discontinued and lasix was started in its's place.  Today BP is up- pt tells me that she ran out of her coreg 2 days ago.  Reports improvement in LE edema since she started furosemide.   Review of Systems    see HPI  Past Medical History  Diagnosis Date  . Chest pain 12/2007    negative ECHO, neg stress test 06/2008  . Hypertension   . Allergy     allergic rhinitis  . Hyperglycemia     borderline. A1c 5.7  12/2007  . Condyloma acuminatum     history of  . Anemia 06/2007    C-scope 06/2007: tics, neg hemocults 07/2007    History   Social History  . Marital Status: Single    Spouse Name: N/A    Number of Children: 3  . Years of Education: N/A   Occupational History  .  [employer]   Social History Main Topics  . Smoking status: Former Smoker -- 0.5 packs/day for 27 years    Types: Cigarettes    Quit date: 02/12/2004  . Smokeless tobacco: Never Used  . Alcohol Use: No  . Drug Use: No  . Sexually Active: Not on file   Other Topics Concern  . Not on file   Social History Narrative   Regular exercise  NoCaffeine-- 1 daily    Past Surgical History  Procedure Date  . Cesarean section   . Abdominal hysterectomy     no oophorectomy  . Breast surgery age 64    Fibroid tumor removed right breast    Family History  Problem Relation Age of Onset  . Diabetes Mother   . Coronary artery disease Father   . Kidney disease Father   . Diabetes Father   . Cancer Father     prostate  . Hypertension Father   . Coronary artery disease Sister   . Kidney disease Sister   . Diabetes Sister   . Kidney disease Brother   . Stroke Brother   . Cancer Paternal Aunt     breast  . Diabetes Maternal Grandmother   . Stroke Maternal Grandmother   . Diabetes Paternal Grandmother   .  Stroke Paternal Grandmother   . Coronary artery disease Other     grandmother, uncle  . Kidney disease Other     grandmother  . Stroke Other     uncle  . Thyroid disease Other     aunt--hyperthyroid; aunt--hypothyroid  . Glaucoma Other     grandmother  . Hypertension Other     GM, F, sis, 3 bro  . Diabetes Other     aunts  . Cancer Cousin     cervical  . Colon cancer Neg Hx     Allergies  Allergen Reactions  . Codeine     REACTION: nausea    Current Outpatient Prescriptions on File Prior to Visit  Medication Sig Dispense Refill  . Multiple Vitamins-Minerals (WOMENS MULTIVITAMIN PLUS) TABS Take 1 tablet by mouth daily.        . naproxen sodium (ANAPROX) 220 MG tablet Take 220 mg by mouth as needed.        . potassium chloride SA (K-DUR,KLOR-CON) 20 MEQ tablet Take 1 tablet (20 mEq  total) by mouth daily.  90 tablet  1  . DISCONTD: carvedilol (COREG) 25 MG tablet TAKE ONE TABLET BY MOUTH TWICE DAILY WITH MEALS  60 tablet  0  . DISCONTD: furosemide (LASIX) 20 MG tablet Take 1 tablet (20 mg total) by mouth daily.  30 tablet  0  . gabapentin (NEURONTIN) 100 MG capsule Take 1 capsule (100 mg total) by mouth 3 (three) times daily.  90 capsule  2  . loratadine (ALAVERT) 10 MG tablet Take 10 mg by mouth as needed.          BP 150/110  Pulse 71  Temp 98.1 F (36.7 C) (Oral)  Resp 16  Ht 5\' 2"  (1.575 m)  Wt 241 lb 1.3 oz (109.353 kg)  BMI 44.09 kg/m2  SpO2 99%    Objective:   Physical Exam  Constitutional: She appears well-developed and well-nourished. No distress.  HENT:  Head: Normocephalic and atraumatic.  Mouth/Throat: No oropharyngeal exudate.  Cardiovascular: Normal rate and regular rhythm.   No murmur heard. Pulmonary/Chest: Effort normal and breath sounds normal. No respiratory distress. She has no wheezes. She has no rales. She exhibits no tenderness.  Musculoskeletal: She exhibits no edema.  Skin: Skin is warm and dry. No rash noted. No erythema. No pallor.    Psychiatric: She has a normal mood and affect. Her behavior is normal. Judgment and thought content normal.          Assessment & Plan:

## 2011-08-16 NOTE — Assessment & Plan Note (Addendum)
BP is deteriorated due to pt running out of coreg.  Resume coreg.  Edema is resolved with change from hctz to furosemide.  Obtain bmet.

## 2011-08-19 ENCOUNTER — Encounter: Payer: Self-pay | Admitting: Family

## 2011-09-27 ENCOUNTER — Ambulatory Visit (HOSPITAL_BASED_OUTPATIENT_CLINIC_OR_DEPARTMENT_OTHER)
Admission: RE | Admit: 2011-09-27 | Discharge: 2011-09-27 | Disposition: A | Discharge status: Home / Self Care | Payer: Managed Care, Other (non HMO) | Source: Ambulatory Visit | Attending: Family | Admitting: Family

## 2011-09-27 ENCOUNTER — Ambulatory Visit (INDEPENDENT_AMBULATORY_CARE_PROVIDER_SITE_OTHER): Payer: Managed Care, Other (non HMO) | Admitting: Family

## 2011-09-27 ENCOUNTER — Encounter: Payer: Self-pay | Admitting: Family

## 2011-09-27 VITALS — BP 150/98 | HR 76 | Temp 98.5°F | Resp 16 | Ht 62.0 in | Wt 243.1 lb

## 2011-09-27 DIAGNOSIS — M25519 Pain in unspecified shoulder: Secondary | ICD-10-CM

## 2011-09-27 DIAGNOSIS — M542 Cervicalgia: Secondary | ICD-10-CM | POA: Insufficient documentation

## 2011-09-27 DIAGNOSIS — M5030 Other cervical disc degeneration, unspecified cervical region: Secondary | ICD-10-CM | POA: Insufficient documentation

## 2011-09-27 DIAGNOSIS — I10 Essential (primary) hypertension: Secondary | ICD-10-CM

## 2011-09-27 DIAGNOSIS — M47812 Spondylosis without myelopathy or radiculopathy, cervical region: Secondary | ICD-10-CM | POA: Insufficient documentation

## 2011-09-27 DIAGNOSIS — M25512 Pain in left shoulder: Secondary | ICD-10-CM

## 2011-09-27 MED ORDER — CYCLOBENZAPRINE HCL 5 MG PO TABS: 5.0000 mg | ORAL_TABLET | Freq: Every evening | ORAL | Status: AC | PRN

## 2011-09-27 MED ORDER — LISINOPRIL 10 MG PO TABS: 10.0000 mg | ORAL_TABLET | Freq: Every day | ORAL | Status: DC

## 2011-09-27 MED ORDER — MELOXICAM 15 MG PO TABS: 15.0000 mg | ORAL_TABLET | Freq: Every day | ORAL | Status: DC

## 2011-09-27 NOTE — Assessment & Plan Note (Signed)
Obtain X-ray of left shoulder.  Mobic/flexeril PRN.

## 2011-09-27 NOTE — Progress Notes (Signed)
Subjective:    Patient ID: Jennifer [name], female    DOB: Mar 30, 1960, 51 y.o.   MRN: [id number]  HPI  Ms.  [name] is a 51 yr old female who presents today with chief complaint of shoulder pain.    Left shoulder pain- worse with movement, no improvement with ibuprofen.  + pain with movement.   She also reports neck pain.  Reports that her neck "locked up" on her the other day which scared her.    HTN- notes increased stress at home.  Married 3 months ago.  She is regretting her decision.  Review of Systems See HPI  Past Medical History  Diagnosis Date  . Chest pain 12/2007    negative ECHO, neg stress test 06/2008  . Hypertension   . Allergy     allergic rhinitis  . Hyperglycemia     borderline. A1c 5.7  12/2007  . Condyloma acuminatum     history of  . Anemia 06/2007    C-scope 06/2007: tics, neg hemocults 07/2007    History   Social History  . Marital Status: Married    Spouse Name: N/A    Number of Children: 3  . Years of Education: N/A   Occupational History  .  [employer]   Social History Main Topics  . Smoking status: Former Smoker -- 0.5 packs/day for 27 years    Types: Cigarettes    Quit date: 02/12/2004  . Smokeless tobacco: Never Used  . Alcohol Use: No  . Drug Use: No  . Sexually Active: Not on file   Other Topics Concern  . Not on file   Social History Narrative   Regular exercise  NoCaffeine-- 1 daily    Past Surgical History  Procedure Date  . Cesarean section   . Abdominal hysterectomy     no oophorectomy  . Breast surgery age 55    Fibroid tumor removed right breast    Family History  Problem Relation Age of Onset  . Diabetes Mother   . Coronary artery disease Father   . Kidney disease Father   . Diabetes Father   . Cancer Father     prostate  . Hypertension Father   . Coronary artery disease Sister   . Kidney disease Sister   . Diabetes Sister   . Kidney disease Brother   . Stroke Brother   . Cancer Paternal Aunt       breast  . Diabetes Maternal Grandmother   . Stroke Maternal Grandmother   . Diabetes Paternal Grandmother   . Stroke Paternal Grandmother   . Coronary artery disease Other     grandmother, uncle  . Kidney disease Other     grandmother  . Stroke Other     uncle  . Thyroid disease Other     aunt--hyperthyroid; aunt--hypothyroid  . Glaucoma Other     grandmother  . Hypertension Other     GM, F, sis, 3 bro  . Diabetes Other     aunts  . Cancer Cousin     cervical  . Colon cancer Neg Hx     Allergies  Allergen Reactions  . Codeine     REACTION: nausea    Current Outpatient Prescriptions on File Prior to Visit  Medication Sig Dispense Refill  . carvedilol (COREG) 25 MG tablet Take 1 tablet (25 mg total) by mouth 2 (two) times daily with a meal.  60 tablet  5  . furosemide (LASIX) 20 MG tablet Take  1 tablet (20 mg total) by mouth daily.  30 tablet  2  . loratadine (ALAVERT) 10 MG tablet Take 10 mg by mouth as needed.        . Multiple Vitamins-Minerals (WOMENS MULTIVITAMIN PLUS) TABS Take 1 tablet by mouth daily.        . potassium chloride SA (K-DUR,KLOR-CON) 20 MEQ tablet Take 1 tablet (20 mEq total) by mouth daily.  90 tablet  1  . gabapentin (NEURONTIN) 100 MG capsule Take 1 capsule (100 mg total) by mouth 3 (three) times daily.  90 capsule  2  . lisinopril (PRINIVIL,ZESTRIL) 10 MG tablet Take 1 tablet (10 mg total) by mouth daily.  30 tablet  0    BP 150/98  Pulse 76  Temp 98.5 F (36.9 C) (Oral)  Resp 16  Ht 5\' 2"  (1.575 m)  Wt 243 lb 1.3 oz (110.26 kg)  BMI 44.46 kg/m2  SpO2 98%       Objective:   Physical Exam  Constitutional: She appears well-developed and well-nourished. No distress.  Cardiovascular: Normal rate and regular rhythm.   No murmur heard. Pulmonary/Chest: Effort normal and breath sounds normal. No respiratory distress. She has no wheezes. She has no rales. She exhibits no tenderness.  Musculoskeletal:       Full ROM of left shoulder.   Shoulder strength is normal.   Neurological:       Bilateral upper extrem strength is 5/5.          Assessment & Plan:

## 2011-09-27 NOTE — Assessment & Plan Note (Signed)
Will obtain x-ray of c-spine.  Rx with mobic and prn flexeril.

## 2011-09-27 NOTE — Patient Instructions (Addendum)
Please follow up in 1 week.  Complete your x-rays on the first floor.

## 2011-09-27 NOTE — Assessment & Plan Note (Signed)
Deteriorated.  Add lisinopril.  Pt is instructed on rare SE of angioedema and to go to the ED if this occurs.  We also discussed potential for ACE associated cough.

## 2011-09-30 ENCOUNTER — Telehealth: Payer: Self-pay | Admitting: Family

## 2011-09-30 DIAGNOSIS — M25512 Pain in left shoulder: Secondary | ICD-10-CM

## 2011-09-30 DIAGNOSIS — M5090 Cervical disc disorder, unspecified, unspecified cervical region: Secondary | ICD-10-CM

## 2011-09-30 NOTE — Telephone Encounter (Addendum)
Attempted to reach pt to discuss imaging, no answer. Left message.

## 2011-10-02 MED ORDER — METHYLPREDNISOLONE (PAK) 4 MG PO TABS: ORAL_TABLET | ORAL | Status: AC

## 2011-10-02 NOTE — Telephone Encounter (Signed)
Called pt, discussed radiology results.  Plan referral to ortho for shoulder. MRI C spine to further evaluate. She is still having pain radiating down left arm, NSAIDS only helping some, will rx with medrol pak.  Pt verbalizes understanding.

## 2011-10-04 ENCOUNTER — Ambulatory Visit: Payer: Managed Care, Other (non HMO) | Admitting: Family

## 2011-10-08 ENCOUNTER — Telehealth: Payer: Self-pay | Admitting: *Deleted

## 2011-10-08 NOTE — Telephone Encounter (Signed)
Received denial of request for MRI Cervical Spine. Letter forwarded to Provider for review. Please advise.

## 2011-10-09 NOTE — Telephone Encounter (Signed)
Notified pt and she voices understanding. 

## 2011-10-09 NOTE — Telephone Encounter (Signed)
Left message on cell# to return my call. 

## 2011-10-09 NOTE — Telephone Encounter (Signed)
Pls let pt know that her insurance has denied approval for the mri of her neck. They are requesting that we wait 6 weeks and if pain is still present at that time, then we can re-order.  In the meantime, she should let me know if she develops numbness/weakness in her arms or if her pain worsens.  Otherwise, keep follow up apt in sept.

## 2011-10-17 ENCOUNTER — Telehealth: Payer: Self-pay | Admitting: Family

## 2011-10-17 NOTE — Telephone Encounter (Signed)
Called to [facility] for authorization status on MR Lumbar Spine   Request denied  ,

## 2011-10-25 ENCOUNTER — Ambulatory Visit (INDEPENDENT_AMBULATORY_CARE_PROVIDER_SITE_OTHER): Payer: Managed Care, Other (non HMO) | Admitting: Family

## 2011-10-25 ENCOUNTER — Encounter: Payer: Self-pay | Admitting: Family

## 2011-10-25 ENCOUNTER — Ambulatory Visit: Payer: Managed Care, Other (non HMO) | Admitting: Family

## 2011-10-25 VITALS — BP 142/100 | HR 80 | Temp 98.3°F | Resp 16 | Ht 62.0 in | Wt 239.1 lb

## 2011-10-25 DIAGNOSIS — H6690 Otitis media, unspecified, unspecified ear: Secondary | ICD-10-CM

## 2011-10-25 DIAGNOSIS — M25512 Pain in left shoulder: Secondary | ICD-10-CM

## 2011-10-25 DIAGNOSIS — H6691 Otitis media, unspecified, right ear: Secondary | ICD-10-CM

## 2011-10-25 DIAGNOSIS — N951 Menopausal and female climacteric states: Secondary | ICD-10-CM

## 2011-10-25 DIAGNOSIS — M25519 Pain in unspecified shoulder: Secondary | ICD-10-CM

## 2011-10-25 DIAGNOSIS — I10 Essential (primary) hypertension: Secondary | ICD-10-CM

## 2011-10-25 LAB — BASIC METABOLIC PANEL
CO2: 30 mEq/L (ref 19–32)
Calcium: 9.3 mg/dL (ref 8.4–10.5)
Chloride: 103 mEq/L (ref 96–112)
Creat: 0.82 mg/dL (ref 0.50–1.10)
Glucose, Bld: 95 mg/dL (ref 70–99)

## 2011-10-25 MED ORDER — AMOXICILLIN-POT CLAVULANATE 875-125 MG PO TABS: 1.0000 | ORAL_TABLET | Freq: Two times a day (BID) | ORAL | Status: AC

## 2011-10-25 MED ORDER — LISINOPRIL 10 MG PO TABS: 10.0000 mg | ORAL_TABLET | Freq: Every day | ORAL | Status: DC

## 2011-10-25 MED ORDER — GABAPENTIN 100 MG PO CAPS: 100.0000 mg | ORAL_CAPSULE | Freq: Three times a day (TID) | ORAL | Status: DC

## 2011-10-25 NOTE — Progress Notes (Signed)
Subjective:    Patient ID: Jennifer [name], female    DOB: 1960/08/22, 51 y.o.   MRN: [id number]  HPI  Jennifer [name] is a 51 yr old female who presents today for follow up.  Hot flashes- intolerant to citalopram due to drowsiness. She was taking neurontin which was helping, but she ran out 3 weeks ago and since that time hot flashes have returned.    Nasal congestion- clear nasal congestion x 2 days. Took alka selzer plus last night.  Felt achey on Tues night.  HTN-On coreg and lisinopril. Reports compliance with both meds.   Shoulder pain- pt was referred to orthopedics.  Reports that her phone was off for 3 weeks.  Reports that she received call yesterday re: apt this AM with Dr. Cleophas Dunker which was the first she had heard about apt.  She did not keep this appointment.   Review of Systems    see HPI  Past Medical History  Diagnosis Date  . Chest pain 12/2007    negative ECHO, neg stress test 06/2008  . Hypertension   . Allergy     allergic rhinitis  . Hyperglycemia     borderline. A1c 5.7  12/2007  . Condyloma acuminatum     history of  . Anemia 06/2007    C-scope 06/2007: tics, neg hemocults 07/2007    History   Social History  . Marital Status: Married    Spouse Name: N/A    Number of Children: 3  . Years of Education: N/A   Occupational History  .  [employer]   Social History Main Topics  . Smoking status: Former Smoker -- 0.5 packs/day for 27 years    Types: Cigarettes    Quit date: 02/12/2004  . Smokeless tobacco: Never Used  . Alcohol Use: No  . Drug Use: No  . Sexually Active: Not on file   Other Topics Concern  . Not on file   Social History Narrative   Regular exercise  NoCaffeine-- 1 daily    Past Surgical History  Procedure Date  . Cesarean section   . Abdominal hysterectomy     no oophorectomy  . Breast surgery age 53    Fibroid tumor removed right breast    Family History  Problem Relation Age of Onset  . Diabetes Mother   .  Coronary artery disease Father   . Kidney disease Father   . Diabetes Father   . Cancer Father     prostate  . Hypertension Father   . Coronary artery disease Sister   . Kidney disease Sister   . Diabetes Sister   . Kidney disease Brother   . Stroke Brother   . Cancer Paternal Aunt     breast  . Diabetes Maternal Grandmother   . Stroke Maternal Grandmother   . Diabetes Paternal Grandmother   . Stroke Paternal Grandmother   . Coronary artery disease Other     grandmother, uncle  . Kidney disease Other     grandmother  . Stroke Other     uncle  . Thyroid disease Other     aunt--hyperthyroid; aunt--hypothyroid  . Glaucoma Other     grandmother  . Hypertension Other     GM, F, sis, 3 bro  . Diabetes Other     aunts  . Cancer Cousin     cervical  . Colon cancer Neg Hx     Allergies  Allergen Reactions  . Codeine  REACTION: nausea    Current Outpatient Prescriptions on File Prior to Visit  Medication Sig Dispense Refill  . carvedilol (COREG) 25 MG tablet Take 1 tablet (25 mg total) by mouth 2 (two) times daily with a meal.  60 tablet  5  . fexofenadine (ALLEGRA) 60 MG tablet Take 60 mg by mouth daily as needed.      . furosemide (LASIX) 20 MG tablet Take 1 tablet (20 mg total) by mouth daily.  30 tablet  2  . meloxicam (MOBIC) 15 MG tablet Take 1 tablet (15 mg total) by mouth daily.  10 tablet  0  . Multiple Vitamins-Minerals (WOMENS MULTIVITAMIN PLUS) TABS Take 1 tablet by mouth daily.        . potassium chloride SA (K-DUR,KLOR-CON) 20 MEQ tablet Take 1 tablet (20 mEq total) by mouth daily.  90 tablet  1  . DISCONTD: gabapentin (NEURONTIN) 100 MG capsule Take 1 capsule (100 mg total) by mouth 3 (three) times daily.  90 capsule  2  . DISCONTD: lisinopril (PRINIVIL,ZESTRIL) 10 MG tablet Take 1 tablet (10 mg total) by mouth daily.  30 tablet  0  . loratadine (ALAVERT) 10 MG tablet Take 10 mg by mouth as needed.          BP 142/100  Pulse 80  Temp 98.3 F (36.8 C)  (Oral)  Resp 16  Ht 5\' 2"  (1.575 m)  Wt 239 lb 1.3 oz (108.446 kg)  BMI 43.73 kg/m2  SpO2 98%    Objective:   Physical Exam  Constitutional: She appears well-developed and well-nourished. No distress.  HENT:  Head: Atraumatic.  Right Ear: Tympanic membrane is erythematous. Tympanic membrane is not bulging.  Left Ear: Tympanic membrane and ear canal normal.  Cardiovascular: Normal rate and regular rhythm.   No murmur heard. Pulmonary/Chest: Effort normal and breath sounds normal. No respiratory distress. She has no wheezes. She has no rales. She exhibits no tenderness.  Musculoskeletal: She exhibits no edema.  Lymphadenopathy:    She has no cervical adenopathy.  Psychiatric: She has a normal mood and affect. Her behavior is normal. Thought content normal.          Assessment & Plan:

## 2011-10-25 NOTE — Assessment & Plan Note (Signed)
Will rx with augmentin.  

## 2011-10-25 NOTE — Assessment & Plan Note (Signed)
Resume gabapentin

## 2011-10-25 NOTE — Assessment & Plan Note (Signed)
Recommended that pt call Dr. Hoy Register office to reschedule her appointment.

## 2011-10-25 NOTE — Patient Instructions (Addendum)
Please call Dr. Hoy Register office to reschedule your appointment- [phone] Avoid decongestants containing sudafed. You may use claritin or coricidin HBP. Follow up in 2 months.

## 2011-10-25 NOTE — Assessment & Plan Note (Signed)
BP Readings from Last 3 Encounters:  10/25/11 142/100  09/27/11 150/98  08/16/11 150/110   BP is better today but still high.  She did take a product containing a decongestant.  Continue current meds.  If remains increased next visit plan increase lisinopril.  Obtain bmet today.

## 2011-10-28 ENCOUNTER — Encounter: Payer: Self-pay | Admitting: Family

## 2011-11-25 ENCOUNTER — Other Ambulatory Visit: Payer: Self-pay | Admitting: Orthopaedic Surgery

## 2011-11-25 DIAGNOSIS — M25512 Pain in left shoulder: Secondary | ICD-10-CM

## 2011-11-28 ENCOUNTER — Ambulatory Visit
Admission: RE | Admit: 2011-11-28 | Discharge: 2011-11-28 | Disposition: A | Discharge status: Home / Self Care | Payer: Managed Care, Other (non HMO) | Source: Ambulatory Visit | Attending: Orthopaedic Surgery | Admitting: Orthopaedic Surgery

## 2011-11-28 DIAGNOSIS — M25512 Pain in left shoulder: Secondary | ICD-10-CM

## 2011-12-17 ENCOUNTER — Emergency Department (HOSPITAL_COMMUNITY)
Admission: EM | Admit: 2011-12-17 | Discharge: 2011-12-17 | Disposition: A | Discharge status: Home / Self Care | Payer: Managed Care, Other (non HMO) | Attending: Emergency Medicine | Admitting: Emergency Medicine

## 2011-12-17 ENCOUNTER — Emergency Department (HOSPITAL_COMMUNITY): Payer: Managed Care, Other (non HMO)

## 2011-12-17 ENCOUNTER — Encounter (HOSPITAL_COMMUNITY): Payer: Self-pay

## 2011-12-17 DIAGNOSIS — F411 Generalized anxiety disorder: Secondary | ICD-10-CM | POA: Insufficient documentation

## 2011-12-17 DIAGNOSIS — I10 Essential (primary) hypertension: Secondary | ICD-10-CM | POA: Insufficient documentation

## 2011-12-17 DIAGNOSIS — R079 Chest pain, unspecified: Secondary | ICD-10-CM

## 2011-12-17 DIAGNOSIS — Z862 Personal history of diseases of the blood and blood-forming organs and certain disorders involving the immune mechanism: Secondary | ICD-10-CM | POA: Insufficient documentation

## 2011-12-17 DIAGNOSIS — A630 Anogenital (venereal) warts: Secondary | ICD-10-CM | POA: Insufficient documentation

## 2011-12-17 DIAGNOSIS — R0789 Other chest pain: Secondary | ICD-10-CM | POA: Insufficient documentation

## 2011-12-17 DIAGNOSIS — Z79899 Other long term (current) drug therapy: Secondary | ICD-10-CM | POA: Insufficient documentation

## 2011-12-17 DIAGNOSIS — Z87891 Personal history of nicotine dependence: Secondary | ICD-10-CM | POA: Insufficient documentation

## 2011-12-17 DIAGNOSIS — R071 Chest pain on breathing: Secondary | ICD-10-CM | POA: Insufficient documentation

## 2011-12-17 DIAGNOSIS — Z8639 Personal history of other endocrine, nutritional and metabolic disease: Secondary | ICD-10-CM | POA: Insufficient documentation

## 2011-12-17 HISTORY — DX: Anxiety disorder, unspecified: F41.9

## 2011-12-17 LAB — BASIC METABOLIC PANEL
BUN: 9 mg/dL (ref 6–23)
CO2: 30 mEq/L (ref 19–32)
Chloride: 100 mEq/L (ref 96–112)
Creatinine, Ser: 0.83 mg/dL (ref 0.50–1.10)
Glucose, Bld: 118 mg/dL — ABNORMAL HIGH (ref 70–99)

## 2011-12-17 LAB — CBC WITH DIFFERENTIAL/PLATELET
HCT: 35.9 % — ABNORMAL LOW (ref 36.0–46.0)
Hemoglobin: 11.8 g/dL — ABNORMAL LOW (ref 12.0–15.0)
Lymphocytes Relative: 38 % (ref 12–46)
MCHC: 32.9 g/dL (ref 30.0–36.0)
Monocytes Absolute: 0.4 10*3/uL (ref 0.1–1.0)
Monocytes Relative: 8 % (ref 3–12)
Neutro Abs: 2.5 10*3/uL (ref 1.7–7.7)

## 2011-12-17 MED ORDER — CARVEDILOL 25 MG PO TABS: 25.0000 mg | ORAL_TABLET | Freq: Two times a day (BID) | ORAL | Status: DC

## 2011-12-17 MED ORDER — LISINOPRIL 10 MG PO TABS: 10.0000 mg | ORAL_TABLET | Freq: Every day | ORAL | Status: DC

## 2011-12-17 MED ORDER — ATORVASTATIN CALCIUM 20 MG PO TABS: 10.0000 mg | ORAL_TABLET | Freq: Every day | ORAL | Status: DC

## 2011-12-17 MED ORDER — FUROSEMIDE 20 MG PO TABS: 20.0000 mg | ORAL_TABLET | Freq: Every day | ORAL | Status: DC

## 2011-12-17 MED ORDER — SODIUM CHLORIDE 0.9 % IV SOLN
Freq: Once | INTRAVENOUS | Status: AC
  Administered 2011-12-17: 10:00:00 via INTRAVENOUS

## 2011-12-17 MED ORDER — ASPIRIN 81 MG PO CHEW: 81.0000 mg | CHEWABLE_TABLET | Freq: Every day | ORAL | Status: DC

## 2011-12-17 NOTE — ED Provider Notes (Signed)
2:05 PM Filed Vitals:   12/17/11 1100 12/17/11 1200 12/17/11 1228 12/17/11 1300  BP: 176/97 162/93 162/93 160/80  Pulse: 65 67 64 66  Temp:      TempSrc:      Resp:   20   SpO2: 98% 99% 97% 97%   assumed care of the patient in CDU from Dr. Bernette Mayers.  Patient presents with chief complaint of chest tightness and pressure as well as pain that radiates to the right neck.  Negative cardiac workup.  Patient BMI is too high for this coronary angiography patient is here awaiting a cardiology consult  Patient seen and d/c by cardiology before Provider could assess. [place] recommends lifestyle mod.  Rx refills.  Arthor Captain, PA-C 12/17/11 1610

## 2011-12-17 NOTE — ED Notes (Signed)
Pt undressed, in gown, on monitor, continuous pulse oximetry and blood pressure cuff; EKG performed 

## 2011-12-17 NOTE — ED Notes (Signed)
EKG completed

## 2011-12-17 NOTE — ED Notes (Signed)
Pt was at work and started having chest pain, states she felt a tightness in her chest. No V/ or shob.

## 2011-12-17 NOTE — ED Provider Notes (Signed)
History     CSN: [id number]  Arrival date & time 12/17/11  4098   First MD Initiated Contact with Patient 12/17/11 [phone]      Chief Complaint  Patient presents with  . Chest Pain    (Consider location/radiation/quality/duration/timing/severity/associated sxs/prior treatment) HPI Pt with history of HTN but no known CAD reports onset of waxing and waning midsternal chest pressure radiating to R neck, no associated diaphoresis, nausea or SOB. Symptoms resolved after ASA and NTG enroute. She had a neg stress test over three years ago.   Past Medical History  Diagnosis Date  . Chest pain 12/2007    negative ECHO, neg stress test 06/2008  . Hypertension   . Allergy     allergic rhinitis  . Hyperglycemia     borderline. A1c 5.7  12/2007  . Condyloma acuminatum     history of  . Anemia 06/2007    C-scope 06/2007: tics, neg hemocults 07/2007  . Anxiety     Past Surgical History  Procedure Date  . Cesarean section   . Abdominal hysterectomy     no oophorectomy  . Breast surgery age 13    Fibroid tumor removed right breast  . Cholecystectomy     Family History  Problem Relation Age of Onset  . Diabetes Mother   . Coronary artery disease Father   . Kidney disease Father   . Diabetes Father   . Cancer Father     prostate  . Hypertension Father   . Coronary artery disease Sister   . Kidney disease Sister   . Diabetes Sister   . Kidney disease Brother   . Stroke Brother   . Cancer Paternal Aunt     breast  . Diabetes Maternal Grandmother   . Stroke Maternal Grandmother   . Diabetes Paternal Grandmother   . Stroke Paternal Grandmother   . Coronary artery disease Other     grandmother, uncle  . Kidney disease Other     grandmother  . Stroke Other     uncle  . Thyroid disease Other     aunt--hyperthyroid; aunt--hypothyroid  . Glaucoma Other     grandmother  . Hypertension Other     GM, F, sis, 3 bro  . Diabetes Other     aunts  . Cancer Cousin     cervical  .  Colon cancer Neg Hx     History  Substance Use Topics  . Smoking status: Former Smoker -- 0.5 packs/day for 27 years    Types: Cigarettes    Quit date: 02/12/2004  . Smokeless tobacco: Never Used  . Alcohol Use: No    OB History    Grav Para Term Preterm Abortions TAB SAB Ect Mult Living                  Review of Systems All other systems reviewed and are negative except as noted in HPI.   Allergies  Codeine  Home Medications   Current Outpatient Rx  Name  Route  Sig  Dispense  Refill  . CARVEDILOL 25 MG PO TABS   Oral   Take 25 mg by mouth 2 (two) times daily with a meal.         . LISINOPRIL 10 MG PO TABS   Oral   Take 10 mg by mouth daily.         . WOMENS MULTIVITAMIN PLUS PO TABS   Oral   Take 1 tablet by mouth daily.          Marland Kitchen  POTASSIUM CHLORIDE CRYS ER 20 MEQ PO TBCR   Oral   Take 20 mEq by mouth daily.         . FUROSEMIDE 20 MG PO TABS   Oral   Take 20 mg by mouth daily.           BP 154/90  Temp 98.4 F (36.9 C) (Oral)  Resp 20  SpO2 98%  Physical Exam  Nursing note and vitals reviewed. Constitutional: She is oriented to person, place, and time. She appears well-developed and well-nourished.  HENT:  Head: Normocephalic and atraumatic.  Eyes: EOM are normal. Pupils are equal, round, and reactive to light.  Neck: Normal range of motion. Neck supple.  Cardiovascular: Normal rate, normal heart sounds and intact distal pulses.   Pulmonary/Chest: Effort normal and breath sounds normal.  Abdominal: Bowel sounds are normal. She exhibits no distension. There is no tenderness.  Musculoskeletal: Normal range of motion. She exhibits no edema and no tenderness.  Neurological: She is alert and oriented to person, place, and time. She has normal strength. No cranial nerve deficit or sensory deficit.  Skin: Skin is warm and dry. No rash noted.  Psychiatric: She has a normal mood and affect.    ED Course  Procedures (including critical  care time)  Labs Reviewed  CBC WITH DIFFERENTIAL - Abnormal; Notable for the following:    Hemoglobin 11.8 (*)     HCT 35.9 (*)     All other components within normal limits  BASIC METABOLIC PANEL - Abnormal; Notable for the following:    Glucose, Bld 118 (*)     GFR calc non Af Amer 79 (*)     All other components within normal limits  TROPONIN I  POCT I-STAT TROPONIN I   Dg Chest 2 View  12/17/2011  *RADIOLOGY REPORT*  Clinical Data: Chest pain.  CHEST - 2 VIEW  Comparison: April 05, 2009.  Findings: Cardiomediastinal silhouette appears normal.  No acute pulmonary disease is noted.  Bony thorax is intact.  IMPRESSION: No acute cardiopulmonary abnormality seen.   Original Report Authenticated By: Lupita Raider.,  M.D.      No diagnosis found.    MDM   Date: 12/17/2011  Rate: 75  Rhythm: normal sinus rhythm  QRS Axis: normal  Intervals: normal  ST/T Wave abnormalities: normal  Conduction Disutrbances: none  Narrative Interpretation: unremarkable  Pt with neg Trop. Her BMI is too high for CT Coronary and she is currently taking B-blocker so cannot have CDU CP protocol stress test. Discussed with [facility] who will evaluate in the ED.   Pt seen by Corinda Gubler who recommends lifestyle and risk factor modification with outpatient PCP followup. No further ED eval needed. Rx for refill of meds written    Charles B. Bernette Mayers, MD 12/17/11 1422

## 2011-12-17 NOTE — Consult Note (Signed)
CARDIOLOGY CONSULT NOTE  Patient ID: Jennifer [name], MRN: [id number], DOB/AGE: [birth year] 51 y.o. Admit date: 12/17/2011   Date of Consult: 12/17/2011 Primary Physician: Lemont Fillers., NP Primary Cardiologist: None  Chief Complaint: chest pain Reason for Consult: chest pain eval at ED  HPI:  Patient is 51 year-old woman with PMH of prior smoker, HTN, anxiety and atypical chest pain with negative cardiac workup and strong family history of CAD( Sister had MI at age of 81) presented to the ED with sudden onset of waxing and waning midsternal chest pressure radiating to R neck and right hand. Patient reports that she was resting at her work when her chest pressure happened. No associated diaphoresis, SOB, nausea/vomiting, and heart burn. No aggravating or alleviating factors. Denies long distance travel or any s/s DVT ( father and brother has had blood clots problem).    EMS was called and she was noted to be hypertensive at 180/120 per patient. She received Aspirin and Nitroglycerin x1 and was brought to the ED for further evaluation.  She reports that her symptoms were better after Nitroglycerin and did not went away completely until 2.5 hours later.   Of note, she reports difficulty in controlling her BP for past 2-3 months with BP ranging at 140-160/100's. She only took her coreg this morning, ran out of Lasix for > 2 weeks.  Notably, she reports similar chest pressure/tightness with no radiating pain in 2010 when she had negative cardiac workup including negative stress test.   Past Medical History  Diagnosis Date  . Chest pain 12/2007    negative ECHO, neg stress test 06/2008  . Hypertension   . Allergy     allergic rhinitis  . Hyperglycemia     borderline. A1c 5.7  12/2007  . Condyloma acuminatum     history of  . Anemia 06/2007    C-scope 06/2007: tics, neg hemocults 07/2007  . Anxiety       Most Recent Cardiac Studies: See PMI   Surgical History:  Past Surgical  History  Procedure Date  . Cesarean section   . Abdominal hysterectomy     no oophorectomy  . Breast surgery age 38    Fibroid tumor removed right breast  . Cholecystectomy      Home Meds: Prior to Admission medications   Medication Sig Start Date End Date Taking? Authorizing Provider  carvedilol (COREG) 25 MG tablet Take 25 mg by mouth 2 (two) times daily with a meal. 08/16/11  Yes Sandford Craze, NP  lisinopril (PRINIVIL,ZESTRIL) 10 MG tablet Take 10 mg by mouth daily. 10/25/11 10/24/12 Yes Sandford Craze, NP  Multiple Vitamins-Minerals (WOMENS MULTIVITAMIN PLUS) TABS Take 1 tablet by mouth daily.    Yes Historical Provider, MD  potassium chloride SA (K-DUR,KLOR-CON) 20 MEQ tablet Take 20 mEq by mouth daily. 07/31/10  Yes Sandford Craze, NP  furosemide (LASIX) 20 MG tablet Take 20 mg by mouth daily. 08/16/11 08/15/12  Sandford Craze, NP    Inpatient Medications:    Allergies:  Allergies  Allergen Reactions  . Codeine     REACTION: nausea    History   Social History  . Marital Status: Married    Spouse Name: N/A    Number of Children: 3  . Years of Education: N/A   Occupational History  .  [employer]   Social History Main Topics  . Smoking status: Former Smoker -- 0.5 packs/day for 27 years    Types: Cigarettes    Quit date:  02/12/2004  . Smokeless tobacco: Never Used  . Alcohol Use: No  . Drug Use: No  . Sexually Active: Not on file   Other Topics Concern  . Not on file   Social History Narrative   Regular exercise  NoCaffeine-- 1 daily     Family History  Problem Relation Age of Onset  . Diabetes Mother   . Coronary artery disease Father   . Kidney disease Father   . Diabetes Father   . Cancer Father     prostate  . Hypertension Father   . Coronary artery disease Sister   . Kidney disease Sister   . Diabetes Sister   . Kidney disease Brother   . Stroke Brother   . Cancer Paternal Aunt     breast  . Diabetes Maternal Grandmother     . Stroke Maternal Grandmother   . Diabetes Paternal Grandmother   . Stroke Paternal Grandmother   . Coronary artery disease Other     grandmother, uncle  . Kidney disease Other     grandmother  . Stroke Other     uncle  . Thyroid disease Other     aunt--hyperthyroid; aunt--hypothyroid  . Glaucoma Other     grandmother  . Hypertension Other     GM, F, sis, 3 bro  . Diabetes Other     aunts  . Cancer Cousin     cervical  . Colon cancer Neg Hx      Review of Systems: General: negative for chills, fever, night sweats or weight changes.  Cardiovascular: positive for chest pressure, Negative for edema, orthopnea, palpitations, paroxysmal nocturnal dyspnea, shortness of breath or dyspnea on exertion Dermatological: negative for rash Respiratory: negative for cough or wheezing Urologic: negative for hematuria Abdominal: negative for nausea, vomiting, diarrhea, bright red blood per rectum, melena, or hematemesis Neurologic: negative for visual changes, syncope, or dizziness All other systems reviewed and are otherwise negative except as noted above.  Labs:  [facility] 12/17/11 0851  CKTOTAL --  CKMB --  TROPONINI <0.30   Lab Results  Component Value Date   WBC 4.8 12/17/2011   HGB 11.8* 12/17/2011   HCT 35.9* 12/17/2011   MCV 88.0 12/17/2011   PLT 300 12/17/2011    Lab 12/17/11 0851  NA 138  K 3.7  CL 100  CO2 30  BUN 9  CREATININE 0.83  CALCIUM 9.5  PROT --  BILITOT --  ALKPHOS --  ALT --  AST --  GLUCOSE 118*   Lab Results  Component Value Date   CHOL 200 06/04/2010   HDL 49 06/04/2010   LDLCALC 139* 06/04/2010   TRIG 61 06/04/2010   No results found for this basename: DDIMER    Radiology/Studies:  Dg Chest 2 View  12/17/2011  *RADIOLOGY REPORT*  Clinical Data: Chest pain.  CHEST - 2 VIEW  Comparison: April 05, 2009.  Findings: Cardiomediastinal silhouette appears normal.  No acute pulmonary disease is noted.  Bony thorax is intact.  IMPRESSION: No acute  cardiopulmonary abnormality seen.   Original Report Authenticated By: Lupita Raider.,  M.D.    Mr Shoulder Left Wo Contrast  11/29/2011  *RADIOLOGY REPORT*  Clinical Data:  Left shoulder pain for 3-4 months.  MRI OF THE LEFT SHOULDER WITHOUT CONTRAST  Technique:  Multiplanar, multisequence MR imaging of the left shoulder was performed.  No intravenous contrast was administered.  Comparison:  Plain films 09/27/2011.  FINDINGS: Rotator cuff:  Heterogeneously increased T2 signal in the supraspinatus,  infraspinatus and subscapularis tendons is consistent with tendinopathy.  No rotator cuff tear is identified. Muscles:  There is focal atrophy of the teres minor.  Musculature is otherwise normal in appearance. Biceps long head:  Intact.  Acromioclavicular Joint:  Mild degenerative change is present. Glenohumeral Joint:  The patient has advanced glenohumeral osteoarthritis.  Extensive subchondral cyst formation and edema are seen in the glenoid bone.  The inferior glenohumeral ligament is thickened and irregular.  No joint effusion is identified.  Labrum:  Extensive intrasubstance increased signal is seen in the superior labrum consistent with tear and there appears to be a bucket-handle fragment present.  The inferior labrum also demonstrates extensive degenerative tearing. Bones:  Os acromiale is noted.  IMPRESSION:  1.  Dominant finding is advanced glenohumeral osteoarthritis. Associated tearing of the superior and inferior labrum is identified. 2.  Atrophy of the teres minor raises the possibility of quadrilateral space syndrome.  No lesion compressing the axillary nerve is identified. 3.  Rotator cuff tendinopathy without tear.   Original Report Authenticated By: Bernadene Bell. D'ALESSIO, M.D.     EKG: NSR. No TWI noted.   Physical Exam: Blood pressure 166/97, pulse 57, temperature 98.4 F (36.9 C), temperature source Oral, resp. rate 19, SpO2 99.00%. General: Morbid obesity, Well developed, well nourished, in  no acute distress. Head: Normocephalic, atraumatic, sclera non-icteric, no xanthomas, nares are without discharge.  Neck: Negative for carotid bruits. JVD not elevated. Lungs: Clear bilaterally to auscultation without wheezes, rales, or rhonchi. Breathing is unlabored. Heart: RRR with S1 S2. No murmurs, rubs, or gallops appreciated. Abdomen: Soft, non-tender, non-distended with normoactive bowel sounds. No hepatomegaly. No rebound/guarding. No obvious abdominal masses. Msk:  Strength and tone appear normal for age. Extremities: No clubbing or cyanosis. No edema.  Distal pedal pulses are 2+ and equal bilaterally. Neuro: Alert and oriented X 3. No facial asymmetry. No focal deficit. Moves all extremities spontaneously. Psych:  Responds to questions appropriately with a normal affect.     Assessment and Plan:   1. Chest pain rule out.    The patient is a 51 year old woman who presented with intermittent chest pressure which lasted for ~ 2.5 hours. Which is atypical for cardiac chest pain.  She received aspirin and nitroglycerin with some relief. Her assessment was unremarkable. First set troponin was negative, and no ischemic findings noted on EKG at ED. [facility] cardiology is called for eval of her chest pain.   The differential diagnosis include musculoskeletal pain/GERD/pericarditis/aortic dissection/PNA/PE.  For aortic dissection>>> the clinical manifestations is not consistent with aortic dissection. Her chest pain lasted for ~ 2.5 hours. She is hemodynamically stable with good HH.  Her chest x-ray is normal. The aortic dissection is less likely. For pericarditis>>> no precipitating factors. Her chest pain is not positional. No Rub noted on exam. EKG is unremarkable.  For pneumonia>>> no respiratory symptoms, lung examination unremarkable, no leukocytosis.  Normal chest x-ray . PNA less likely.  For PE>>> no tachycardia or tachypnea, denies long-distance travel, no s/s of DVT, Geneva score 0 = low  probability.   Plan:  - Patient is asymptomatic during the interview. Will repeat Troponin again. - she is on BB, ACEI and Diuretics as an outpent. I think that she can benefit from adding ASA 81 and statin ( Lipitor 20). - aggressively risk factor reduction including HTN and HLD constrol, exercise. weight loss and prevention of DM. I discussed with her about bariatric surgery for her and recs for her to discuss with her  PCP. - she can be discharged from ED with close follow up with her PCP in one week. Even though she has multiple risk factors for CAD, the stress test will probably has low yield for her. Should she develop more chest pain in the future or has exertional Chest pain,  Myoview or cardiac cath could be considered.   2. HTN 3. HLD 4. Morbid obesity   Signed, LI, NA PGY-2 12/17/2011, 10:56 AM   I have taken a history, reviewed medications, allergies, PMH, SH, FH, and reviewed ROS and examined the patient.  I agree with the assessment and plan. I had a long discussion with patient regarding her cardiac risk factors. Weight loss, 3 hours of walking each week, BP control and lipid management with statin and LDL goal of <100.  Jennifer [name] C. Daleen Squibb, MD, [facility] Pager:  [phone]

## 2011-12-17 NOTE — ED Notes (Signed)
MD at bedside. 

## 2011-12-18 ENCOUNTER — Telehealth: Payer: Self-pay | Admitting: Family

## 2011-12-18 NOTE — Telephone Encounter (Signed)
Pls call pt to arrange a 1 week ED follow up visit.

## 2011-12-18 NOTE — Telephone Encounter (Signed)
Left message for patient to return my call.

## 2011-12-19 NOTE — Telephone Encounter (Signed)
Left message for patient to return my call.

## 2011-12-19 NOTE — ED Provider Notes (Signed)
Medical screening examination/treatment/procedure(s) were conducted as a shared visit with non-physician practitioner(s) and myself.  I personally evaluated the patient during the encounter   Charles B. Bernette Mayers, MD 12/19/11 1013

## 2011-12-19 NOTE — Telephone Encounter (Signed)
PATIENT CALLED BACK SCHEDULED ED FOLLOW UP/[facility]

## 2011-12-20 ENCOUNTER — Ambulatory Visit (INDEPENDENT_AMBULATORY_CARE_PROVIDER_SITE_OTHER): Payer: Managed Care, Other (non HMO) | Admitting: Family

## 2011-12-20 ENCOUNTER — Encounter: Payer: Self-pay | Admitting: Family

## 2011-12-20 VITALS — BP 154/96 | HR 60 | Temp 98.4°F | Resp 16 | Wt 246.1 lb

## 2011-12-20 DIAGNOSIS — I10 Essential (primary) hypertension: Secondary | ICD-10-CM

## 2011-12-20 DIAGNOSIS — E669 Obesity, unspecified: Secondary | ICD-10-CM

## 2011-12-20 DIAGNOSIS — R0789 Other chest pain: Secondary | ICD-10-CM

## 2011-12-20 MED ORDER — LISINOPRIL 20 MG PO TABS: 20.0000 mg | ORAL_TABLET | Freq: Every day | ORAL | Status: DC

## 2011-12-20 MED ORDER — FUROSEMIDE 20 MG PO TABS: 20.0000 mg | ORAL_TABLET | Freq: Every day | ORAL | Status: DC

## 2011-12-20 NOTE — Assessment & Plan Note (Signed)
Resolved.  Refer for stress test.

## 2011-12-20 NOTE — Assessment & Plan Note (Signed)
Deteriorated. Increase lisinopril from 10mg  to 20 mg.  Resume furosemide. Plan bmet next visit.

## 2011-12-20 NOTE — Assessment & Plan Note (Addendum)
We discussed healthy diet, exercise and weight loss.  She reports that weight watchers is covered through her insurance.  I recommended that she start attending meetings.  Can start exercising after she completes stress test if normal. Will refer to nutrition. We discussed risks associated with bariatric surgery and I encouraged her work hard through diet and exercise before she considers something invasive.  30 minutes spent with pt today.  >50% of this time was spent counseling pt on diet, exercise and weight loss.

## 2011-12-20 NOTE — Patient Instructions (Addendum)
You will be contact about your referral for nutrition and stress test.  Please let us know if you have not heard back within 1 week about your referral. Please follow up in 2 weeks.

## 2011-12-20 NOTE — Progress Notes (Signed)
Subjective:    Patient ID: Jennifer [name], female    DOB: [birth year], 51 y.o.   MRN: [id number]  HPI  Pt seen in ED on 11/5 with chest pain.  Cardiac w/u was negative. Cardiology was consulted and she was recommended for out pt follow up. Since returning home she denies any further chest pain.   HTN-  Pt is maintained on coreg, lisinopril, furosemide (ran out). BP is up today.    Obesity- she is inquiring about lap band surgery. She reports that she drinks sodas and  is not exercising.     Review of Systems See HPI  Past Medical History  Diagnosis Date  . Chest pain 12/2007    negative ECHO, neg stress test 06/2008  . Hypertension   . Allergy     allergic rhinitis  . Hyperglycemia     borderline. A1c 5.7  12/2007  . Condyloma acuminatum     history of  . Anemia 06/2007    C-scope 06/2007: tics, neg hemocults 07/2007  . Anxiety     History   Social History  . Marital Status: Married    Spouse Name: N/A    Number of Children: 3  . Years of Education: N/A   Occupational History  .  [employer]   Social History Main Topics  . Smoking status: Former Smoker -- 0.5 packs/day for 27 years    Types: Cigarettes    Quit date: 02/12/2004  . Smokeless tobacco: Never Used  . Alcohol Use: No  . Drug Use: No  . Sexually Active: Not on file   Other Topics Concern  . Not on file   Social History Narrative   Regular exercise  NoCaffeine-- 1 daily    Past Surgical History  Procedure Date  . Cesarean section   . Abdominal hysterectomy     no oophorectomy  . Breast surgery age 55    Fibroid tumor removed right breast  . Cholecystectomy     Family History  Problem Relation Age of Onset  . Diabetes Mother   . Coronary artery disease Father   . Kidney disease Father   . Diabetes Father   . Cancer Father     prostate  . Hypertension Father   . Coronary artery disease Sister   . Kidney disease Sister   . Diabetes Sister   . Kidney disease Brother   . Stroke  Brother   . Cancer Paternal Aunt     breast  . Diabetes Maternal Grandmother   . Stroke Maternal Grandmother   . Diabetes Paternal Grandmother   . Stroke Paternal Grandmother   . Coronary artery disease Other     grandmother, uncle  . Kidney disease Other     grandmother  . Stroke Other     uncle  . Thyroid disease Other     aunt--hyperthyroid; aunt--hypothyroid  . Glaucoma Other     grandmother  . Hypertension Other     GM, F, sis, 3 bro  . Diabetes Other     aunts  . Cancer Cousin     cervical  . Colon cancer Neg Hx     Allergies  Allergen Reactions  . Codeine     REACTION: nausea    Current Outpatient Prescriptions on File Prior to Visit  Medication Sig Dispense Refill  . aspirin 81 MG chewable tablet Chew 1 tablet (81 mg total) by mouth daily.  30 tablet  0  . carvedilol (COREG) 25 MG tablet Take  1 tablet (25 mg total) by mouth 2 (two) times daily with a meal.  30 tablet  0  . Multiple Vitamins-Minerals (WOMENS MULTIVITAMIN PLUS) TABS Take 1 tablet by mouth daily.       . potassium chloride SA (K-DUR,KLOR-CON) 20 MEQ tablet Take 20 mEq by mouth daily.      . [DISCONTINUED] furosemide (LASIX) 20 MG tablet Take 1 tablet (20 mg total) by mouth daily.  30 tablet  0  . [DISCONTINUED] lisinopril (PRINIVIL,ZESTRIL) 10 MG tablet Take 1 tablet (10 mg total) by mouth daily.  30 tablet  0  . atorvastatin (LIPITOR) 20 MG tablet Take 0.5 tablets (10 mg total) by mouth daily.  30 tablet  0    BP 154/96  Pulse 60  Temp 98.4 F (36.9 C) (Oral)  Resp 16  Wt 246 lb 1.3 oz (111.621 kg)  SpO2 98%       Objective:   Physical Exam  Constitutional: She is oriented to person, place, and time. She appears well-developed and well-nourished. No distress.  Cardiovascular: Normal rate and regular rhythm.   No murmur heard. Pulmonary/Chest: Effort normal and breath sounds normal. No respiratory distress. She has no wheezes. She has no rales. She exhibits no tenderness.    Neurological: She is alert and oriented to person, place, and time.  Psychiatric: Her behavior is normal. Judgment and thought content normal.       Pt became tearful upon discussion of her obesity.           Assessment & Plan:

## 2011-12-27 ENCOUNTER — Ambulatory Visit: Payer: Managed Care, Other (non HMO) | Admitting: Family

## 2011-12-30 ENCOUNTER — Ambulatory Visit (HOSPITAL_COMMUNITY): Payer: Managed Care, Other (non HMO) | Attending: Family | Admitting: Radiology

## 2011-12-30 VITALS — BP 131/91 | Ht 62.0 in | Wt 242.0 lb

## 2011-12-30 DIAGNOSIS — E669 Obesity, unspecified: Secondary | ICD-10-CM | POA: Insufficient documentation

## 2011-12-30 DIAGNOSIS — R079 Chest pain, unspecified: Secondary | ICD-10-CM | POA: Insufficient documentation

## 2011-12-30 DIAGNOSIS — Z87891 Personal history of nicotine dependence: Secondary | ICD-10-CM | POA: Insufficient documentation

## 2011-12-30 DIAGNOSIS — R0789 Other chest pain: Secondary | ICD-10-CM

## 2011-12-30 DIAGNOSIS — R0602 Shortness of breath: Secondary | ICD-10-CM

## 2011-12-30 DIAGNOSIS — I10 Essential (primary) hypertension: Secondary | ICD-10-CM | POA: Insufficient documentation

## 2011-12-30 MED ORDER — TECHNETIUM TC 99M SESTAMIBI GENERIC - CARDIOLITE
30.0000 | Freq: Once | INTRAVENOUS | Status: AC | PRN
Start: 2011-12-30 — End: 2011-12-30
  Administered 2011-12-30: 30 via INTRAVENOUS

## 2011-12-30 MED ORDER — REGADENOSON 0.4 MG/5ML IV SOLN
0.4000 mg | Freq: Once | INTRAVENOUS | Status: AC
  Administered 2011-12-30: 0.4 mg via INTRAVENOUS

## 2011-12-30 NOTE — Progress Notes (Signed)
[facility] SITE 3 NUCLEAR MED [id number] [place] [address] [phone]  Cardiology Nuclear Med Study  Jennifer [name] is a 51 y.o. female     MRN : [id number]     DOB: 28-Mar-1960  Procedure Date: 12/30/2011  Nuclear Med Background Indication for Stress Test:  Evaluation for Ischemia History:  09' ECHO EF:60-65%;0' GXT ; 12/17/11 ER with CP - enz Cardiac Risk Factors: Family History - CAD, History of Smoking, Hypertension, Lipids and Obesity  Symptoms:  Chest Pressure.  (last date of chest discomfort 12/17/11)   Nuclear Pre-Procedure Caffeine/Decaff Intake:  None NPO After: 6:00pm   Lungs:  clear O2 Sat: 94% on room air. IV 0.9% NS with Angio Cath:  22g  IV Site: R Hand  IV Started by:  Jennifer [name], CNMT  Chest Size (in):  40 Cup Size: D  Height: 5\' 2"  (1.575 m)  Weight:  242 lb (109.77 kg)  BMI:  Body mass index is 44.26 kg/(m^2). Tech Comments:  Took all meds this am     Nuclear Med Study 1 or 2 day study: 2 day  Stress Test Type:  Treadmill/Lexiscan  Reading MD: Jennifer Rough, MD  Order Authorizing Provider:  Evonnie Dawes, NP/ Jennifer Perches, MD  Resting Radionuclide: Technetium 59m Sestamibi  Resting Radionuclide Dose: 33.0 mCi  12/31/11  Stress Radionuclide:  Technetium 4m Sestamibi  Stress Radionuclide Dose: 33.0 mCi  12/30/11          Stress Protocol Rest HR: 71 Stress HR: 117  Rest BP: 131/91 Stress BP: 165/97  Exercise Time (min): 2:00 METS: n/a   Predicted Max HR: 167 bpm % Max HR: 70.06 bpm Rate Pressure Product: 62952   Dose of Adenosine (mg):  n/a Dose of Lexiscan: 0.4 mg  Dose of Atropine (mg): n/a Dose of Dobutamine: n/a mcg/kg/min (at max HR)  Stress Test Technologist: Jennifer [name], EMT-P  Nuclear Technologist:  Jennifer [name], CNMT     Rest Procedure:  Myocardial perfusion imaging was performed at rest 45 minutes following the intravenous administration of Technetium 75m Sestamibi. Rest ECG: NSR - Normal  EKG  Stress Procedure:  The patient received IV Lexiscan 0.4 mg over 15-seconds with concurrent low level exercise and then Technetium 53m Sestamibi was injected at 30-seconds while the patient continued walking one more minute. There were no significant changes, +sob, and headache with Lexiscan. Quantitative spect images were obtained after a 45-minute delay. Stress ECG: No significant change from baseline ECG  QPS Raw Data Images:  Normal; no motion artifact; normal heart/lung ratio. Stress Images:  Normal homogeneous uptake in all areas of the myocardium. Rest Images:  Normal homogeneous uptake in all areas of the myocardium. Subtraction (SDS):  There is no evidence of scar or ischemia. Transient Ischemic Dilatation (Normal <1.22):  1.04 Lung/Heart Ratio (Normal <0.45):  0.38  Quantitative Gated Spect Images QGS EDV:  80 ml QGS ESV:  22 ml  Impression Exercise Capacity:  Lexiscan with no exercise. BP Response:  Normal blood pressure response. Clinical Symptoms:  Shortness of breath.  ECG Impression:  No significant ST segment change suggestive of ischemia. Comparison with Prior Nuclear Study: No images to compare  Overall Impression:  Normal stress nuclear study.  LV Ejection Fraction: 73%.  LV Wall Motion:  NL LV Function; NL Wall Motion  Jennifer [name] 12/31/2011

## 2011-12-31 ENCOUNTER — Ambulatory Visit (HOSPITAL_COMMUNITY): Payer: Managed Care, Other (non HMO) | Attending: Cardiology

## 2011-12-31 DIAGNOSIS — R0989 Other specified symptoms and signs involving the circulatory and respiratory systems: Secondary | ICD-10-CM

## 2011-12-31 MED ORDER — TECHNETIUM TC 99M SESTAMIBI GENERIC - CARDIOLITE
33.0000 | Freq: Once | INTRAVENOUS | Status: AC | PRN
  Administered 2011-12-31: 33 via INTRAVENOUS

## 2012-01-03 ENCOUNTER — Encounter: Payer: Self-pay | Admitting: Family

## 2012-01-03 ENCOUNTER — Other Ambulatory Visit: Payer: Self-pay | Admitting: Family

## 2012-01-03 ENCOUNTER — Ambulatory Visit (INDEPENDENT_AMBULATORY_CARE_PROVIDER_SITE_OTHER): Payer: Managed Care, Other (non HMO) | Admitting: Family

## 2012-01-03 VITALS — BP 160/110 | HR 80 | Temp 98.7°F | Resp 14 | Ht 62.0 in | Wt 248.1 lb

## 2012-01-03 DIAGNOSIS — G5600 Carpal tunnel syndrome, unspecified upper limb: Secondary | ICD-10-CM

## 2012-01-03 DIAGNOSIS — I10 Essential (primary) hypertension: Secondary | ICD-10-CM

## 2012-01-03 MED ORDER — OLMESARTAN-AMLODIPINE-HCTZ 20-5-12.5 MG PO TABS: 1.0000 | ORAL_TABLET | Freq: Every day | ORAL | Status: DC

## 2012-01-03 NOTE — Patient Instructions (Addendum)
Please complete your blood work prior to leaving.  Please follow up in 2 weeks.

## 2012-01-03 NOTE — Assessment & Plan Note (Signed)
Recommended cock up wrist splint to right wrist while sleeping and as able during the day.

## 2012-01-03 NOTE — Assessment & Plan Note (Signed)
Deteriorated.  Will d/c lisinopril and start tribenzor. Follow up in 2 weeks.  Obtain bmet. Discussed diet, exercise weight loss.  Low sodium.

## 2012-01-03 NOTE — Progress Notes (Signed)
Subjective:    Patient ID: Jennifer [name], female    DOB: 1960-11-20, 51 y.o.   MRN: [id number]  HPI  Jennifer [name] presents today for follow up of her blood pressure. Last visit her furosemide was resumed and lisinopril was increased from 10 to 20mg .  She had a stress test performed on 11/18 which was normal. She has also resumed her furosemide.    R hand numbness- she reports "tingling" in her right hand. Works in Administrator, arts doing repetitive hand movements.  Review of Systems See HPI  Past Medical History  Diagnosis Date  . Chest pain 12/2007    negative ECHO, neg stress test 06/2008  . Hypertension   . Allergy     allergic rhinitis  . Hyperglycemia     borderline. A1c 5.7  12/2007  . Condyloma acuminatum     history of  . Anemia 06/2007    C-scope 06/2007: tics, neg hemocults 07/2007  . Anxiety     History   Social History  . Marital Status: Married    Spouse Name: N/A    Number of Children: 3  . Years of Education: N/A   Occupational History  .  [employer]   Social History Main Topics  . Smoking status: Former Smoker -- 0.5 packs/day for 27 years    Types: Cigarettes    Quit date: 02/12/2004  . Smokeless tobacco: Never Used  . Alcohol Use: No  . Drug Use: No  . Sexually Active: Not on file   Other Topics Concern  . Not on file   Social History Narrative   Regular exercise  NoCaffeine-- 1 daily    Past Surgical History  Procedure Date  . Cesarean section   . Abdominal hysterectomy     no oophorectomy  . Breast surgery age 57    Fibroid tumor removed right breast  . Cholecystectomy     Family History  Problem Relation Age of Onset  . Diabetes Mother   . Coronary artery disease Father   . Kidney disease Father   . Diabetes Father   . Cancer Father     prostate  . Hypertension Father   . Coronary artery disease Sister   . Kidney disease Sister   . Diabetes Sister   . Kidney disease Brother   . Stroke Brother   . Cancer  Paternal Aunt     breast  . Diabetes Maternal Grandmother   . Stroke Maternal Grandmother   . Diabetes Paternal Grandmother   . Stroke Paternal Grandmother   . Coronary artery disease Other     grandmother, uncle  . Kidney disease Other     grandmother  . Stroke Other     uncle  . Thyroid disease Other     aunt--hyperthyroid; aunt--hypothyroid  . Glaucoma Other     grandmother  . Hypertension Other     GM, F, sis, 3 bro  . Diabetes Other     aunts  . Cancer Cousin     cervical  . Colon cancer Neg Hx     Allergies  Allergen Reactions  . Codeine     REACTION: nausea    Current Outpatient Prescriptions on File Prior to Visit  Medication Sig Dispense Refill  . aspirin 81 MG chewable tablet Chew 1 tablet (81 mg total) by mouth daily.  30 tablet  0  . carvedilol (COREG) 25 MG tablet Take 1 tablet (25 mg total) by mouth 2 (two) times daily with a  meal.  30 tablet  0  . furosemide (LASIX) 20 MG tablet Take 1 tablet (20 mg total) by mouth daily.  30 tablet  3  . lisinopril (PRINIVIL,ZESTRIL) 20 MG tablet Take 1 tablet (20 mg total) by mouth daily.  30 tablet  1  . Multiple Vitamins-Minerals (WOMENS MULTIVITAMIN PLUS) TABS Take 1 tablet by mouth daily.       Marland Kitchen atorvastatin (LIPITOR) 20 MG tablet Take 0.5 tablets (10 mg total) by mouth daily.  30 tablet  0  . potassium chloride SA (K-DUR,KLOR-CON) 20 MEQ tablet Take 20 mEq by mouth daily.        BP 160/110  Pulse 80  Temp 98.7 F (37.1 C) (Oral)  Resp 14  Ht 5\' 2"  (1.575 m)  Wt 248 lb 1.3 oz (112.528 kg)  BMI 45.37 kg/m2  SpO2 99%       Objective:   Physical Exam  Constitutional: She is oriented to person, place, and time. She appears well-developed and well-nourished. No distress.  HENT:  Head: Normocephalic and atraumatic.  Cardiovascular: Normal rate and regular rhythm.   Pulmonary/Chest: Effort normal and breath sounds normal.  Neurological: She is alert and oriented to person, place, and time.       +Phalans  right hand  Skin: Skin is warm and dry.  Psychiatric: She has a normal mood and affect. Her behavior is normal. Judgment and thought content normal.          Assessment & Plan:

## 2012-01-04 LAB — BASIC METABOLIC PANEL
CO2: 28 mEq/L (ref 19–32)
Glucose, Bld: 87 mg/dL (ref 70–99)
Potassium: 4.1 mEq/L (ref 3.5–5.3)
Sodium: 139 mEq/L (ref 135–145)

## 2012-01-17 ENCOUNTER — Ambulatory Visit (INDEPENDENT_AMBULATORY_CARE_PROVIDER_SITE_OTHER): Payer: Managed Care, Other (non HMO) | Admitting: Family

## 2012-01-17 ENCOUNTER — Encounter: Payer: Self-pay | Admitting: Family

## 2012-01-17 ENCOUNTER — Telehealth: Payer: Self-pay | Admitting: *Deleted

## 2012-01-17 VITALS — BP 136/90 | HR 69 | Temp 98.3°F | Resp 16 | Ht 62.0 in | Wt 249.0 lb

## 2012-01-17 DIAGNOSIS — I10 Essential (primary) hypertension: Secondary | ICD-10-CM

## 2012-01-17 DIAGNOSIS — G5600 Carpal tunnel syndrome, unspecified upper limb: Secondary | ICD-10-CM

## 2012-01-17 LAB — BASIC METABOLIC PANEL
CO2: 29 mEq/L (ref 19–32)
Calcium: 9.6 mg/dL (ref 8.4–10.5)
Creat: 0.86 mg/dL (ref 0.50–1.10)
Glucose, Bld: 97 mg/dL (ref 70–99)

## 2012-01-17 MED ORDER — OLMESARTAN-AMLODIPINE-HCTZ 20-5-12.5 MG PO TABS: 1.0000 | ORAL_TABLET | Freq: Every day | ORAL | Status: DC

## 2012-01-17 MED ORDER — POTASSIUM CHLORIDE CRYS ER 20 MEQ PO TBCR: 20.0000 meq | EXTENDED_RELEASE_TABLET | Freq: Every day | ORAL | Status: DC

## 2012-01-17 NOTE — Assessment & Plan Note (Signed)
Deteriorated. Refer to orthopedics.

## 2012-01-17 NOTE — Assessment & Plan Note (Signed)
BP is improved. Continue tribenzor, obtain bmet.

## 2012-01-17 NOTE — Telephone Encounter (Signed)
Received call from pharmacy wanting clarification of pt's medications. They have Lisinopril Rx on file from 12/20/11. Advised them this was discontinued and pt is now on Tribenzor. They would like to know if pt should continue furosemide and potassium?  Please advise.

## 2012-01-17 NOTE — Telephone Encounter (Signed)
Yes, continue both.

## 2012-01-17 NOTE — Progress Notes (Signed)
Subjective:    Patient ID: Jennifer [name], female    DOB: January 14, 1961, 51 y.o.   MRN: [id number]  HPI  HTN- feeling better since starting tribenzor. Denies CP, SOB, or leg swelling  Carpal tunnel- reports that she has been wearing a cock up wrist splint.  Symptoms are worse.   Review of Systems See HPI  Past Medical History  Diagnosis Date  . Chest pain 12/2007    negative ECHO, neg stress test 06/2008  . Hypertension   . Allergy     allergic rhinitis  . Hyperglycemia     borderline. A1c 5.7  12/2007  . Condyloma acuminatum     history of  . Anemia 06/2007    C-scope 06/2007: tics, neg hemocults 07/2007  . Anxiety     History   Social History  . Marital Status: Married    Spouse Name: N/A    Number of Children: 3  . Years of Education: N/A   Occupational History  .  [employer]   Social History Main Topics  . Smoking status: Former Smoker -- 0.5 packs/day for 27 years    Types: Cigarettes    Quit date: 02/12/2004  . Smokeless tobacco: Never Used  . Alcohol Use: No  . Drug Use: No  . Sexually Active: Not on file   Other Topics Concern  . Not on file   Social History Narrative   Regular exercise  NoCaffeine-- 1 daily    Past Surgical History  Procedure Date  . Cesarean section   . Abdominal hysterectomy     no oophorectomy  . Breast surgery age 59    Fibroid tumor removed right breast  . Cholecystectomy     Family History  Problem Relation Age of Onset  . Diabetes Mother   . Coronary artery disease Father   . Kidney disease Father   . Diabetes Father   . Cancer Father     prostate  . Hypertension Father   . Coronary artery disease Sister   . Kidney disease Sister   . Diabetes Sister   . Kidney disease Brother   . Stroke Brother   . Cancer Paternal Aunt     breast  . Diabetes Maternal Grandmother   . Stroke Maternal Grandmother   . Diabetes Paternal Grandmother   . Stroke Paternal Grandmother   . Coronary artery disease Other      grandmother, uncle  . Kidney disease Other     grandmother  . Stroke Other     uncle  . Thyroid disease Other     aunt--hyperthyroid; aunt--hypothyroid  . Glaucoma Other     grandmother  . Hypertension Other     GM, F, sis, 3 bro  . Diabetes Other     aunts  . Cancer Cousin     cervical  . Colon cancer Neg Hx     Allergies  Allergen Reactions  . Codeine     REACTION: nausea    Current Outpatient Prescriptions on File Prior to Visit  Medication Sig Dispense Refill  . aspirin 81 MG chewable tablet Chew 1 tablet (81 mg total) by mouth daily.  30 tablet  0  . carvedilol (COREG) 25 MG tablet Take 1 tablet (25 mg total) by mouth 2 (two) times daily with a meal.  30 tablet  0  . furosemide (LASIX) 20 MG tablet Take 1 tablet (20 mg total) by mouth daily.  30 tablet  3  . Multiple Vitamins-Minerals (WOMENS MULTIVITAMIN  PLUS) TABS Take 1 tablet by mouth daily.       . potassium chloride SA (K-DUR,KLOR-CON) 20 MEQ tablet Take 20 mEq by mouth daily.      . [DISCONTINUED] Olmesartan-Amlodipine-HCTZ (TRIBENZOR) 20-5-12.5 MG TABS Take 1 tablet by mouth daily.  14 tablet  0  . atorvastatin (LIPITOR) 20 MG tablet Take 0.5 tablets (10 mg total) by mouth daily.  30 tablet  0    BP 136/90  Pulse 69  Temp 98.3 F (36.8 C) (Oral)  Resp 16  Ht 5\' 2"  (1.575 m)  Wt 249 lb (112.946 kg)  BMI 45.54 kg/m2  SpO2 98%       Objective:   Physical Exam  Constitutional: She is oriented to person, place, and time. She appears well-developed and well-nourished. No distress.  Cardiovascular: Normal rate and regular rhythm.   No murmur heard. Pulmonary/Chest: Effort normal and breath sounds normal. No respiratory distress. She has no wheezes. She has no rales. She exhibits no tenderness.  Neurological: She is alert and oriented to person, place, and time.  Skin: Skin is warm and dry.  Psychiatric: She has a normal mood and affect. Her behavior is normal. Judgment and thought content normal.           Assessment & Plan:

## 2012-01-17 NOTE — Patient Instructions (Addendum)
Please complete lab work prior to leaving.   Please schedule a follow up appointment in 3 months.  

## 2012-01-17 NOTE — Telephone Encounter (Signed)
Notified Pam at the pharmacy and gave verbal Rx for potassium ; #30 x 2 refills.

## 2012-01-20 ENCOUNTER — Encounter: Payer: Self-pay | Admitting: Family

## 2012-01-31 ENCOUNTER — Encounter: Payer: Managed Care, Other (non HMO) | Attending: Family | Admitting: *Deleted

## 2012-01-31 ENCOUNTER — Encounter: Payer: Self-pay | Admitting: *Deleted

## 2012-01-31 VITALS — Ht 61.5 in | Wt 248.0 lb

## 2012-01-31 DIAGNOSIS — E669 Obesity, unspecified: Secondary | ICD-10-CM | POA: Insufficient documentation

## 2012-01-31 DIAGNOSIS — Z713 Dietary counseling and surveillance: Secondary | ICD-10-CM | POA: Insufficient documentation

## 2012-01-31 NOTE — Patient Instructions (Signed)
Goals:  Put away the scale.  Refrain from weighing self between nutrition appointments Reject diet mentality- there are no good or bad foods Listen to internal hunger cues and honor those cues; don't wait until you're ravenous to eat Choose the food(s) you want Enjoy those foods.  Try to make meal last 20 minutes.  Minimize distractions.  Turn off tv Stop eating when full.  Honor fullness cues too Do these things without any guilt or regret

## 2012-01-31 NOTE — Progress Notes (Signed)
  Medical Nutrition Therapy:  Appt start time: 0930 end time:  1030.   Assessment:  Primary concerns today: obesity.   MEDICATIONS: see list   DIETARY INTAKE:  Usual eating pattern includes 3 meals and 1-2 snacks per day.  Everyday foods include fast food, takeout, etc. .  Avoided foods include doesn't really like much fruits.    24-hr recall:  B ( AM): bacon, egg, cheese sandwich.  tea  Snk ( AM): none  L ( PM): tokyo grill- hibacchi chicken and shrimp with rice and vegetables Snk ( PM): leftover tokyo grill- hibacchi chicken and shrimp with rice and vegetables with coke D ( PM): tokyo grill- hibacchi chicken and shrimp with rice and vegetables Snk ( PM): sometimes something sweet.  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   Nutritional Diagnosis:  Calera-3.3 Overweight/obesity As related to metabolic effects of chronic deiting as well as energy-dense and sodium-dense diet.  As evidenced by BMI>30 and increasing weight gain.    Intervention:  Nutrition counseling provided.  Abiageal has been gaining weight steadily over her adult life.  She has tried various diets and strict exercise routines and shake plans.  Her weight has yo-yoed, but gradually increased. She has lost her ability to notice hunger and fullness as well and often eats for other reasons besides biological hunger.  Encouraged patient to reject traditional diet mentality of "good" vs "bad" foods.  There are no good and bad foods, but rather food is fuel that we needs for our bodies.  When we don't get enough fuel, our bodies suffer the metabolic consequences.  Encouraged patient to eat whatever foods will satisfy them, regardless of their nutritional value.  We will discuss nutritional values of foods at a subsequent appointment.  (specifically sodium). Encouraged patient to honor their body's internal hunger and fullness cues.  Throughout the day, check in mentally and rate hunger.  Try not to eat when ravenous, but instead when slightly hungry.  Then choose food(s) that will be satisfying regardless of nutritional content.  Sit down to enjoy those foods.  Minimize distractions: turn off tv, put away books, work, Programmer, applications.  Make the meal last at least 20 minutes in order to give time to experience and register satiety.  Stop eating when full regardless of how much food is left on the plate.  Get more if still hungry.  The key is to honor fullness so throughout the meal, rate fullness factor and stop when comfortably full, but not stuffed.  Reminded patient that they can have any food they want, whenever they want, and however much they want.  Eventually the novelty will wear out and each food will be equal in terms of its emotional appeal.  This will be a learning process and some days more food will be eaten, some days less.  The key is to honor hunger and fullness without any feelings of guilt.  Pay attention to what the internal cues are, rather than any external factors.  Monitoring/Evaluation:  Dietary intake, exercise, intuitive eating, and body weight in 1 month(s).

## 2012-02-28 ENCOUNTER — Ambulatory Visit: Payer: Self-pay | Admitting: *Deleted

## 2012-04-02 ENCOUNTER — Telehealth: Payer: Self-pay | Admitting: Family

## 2012-04-02 NOTE — Telephone Encounter (Signed)
Patient left message on nurse voicemail asking if the visit that Jennifer [name] saw her for carpel tunnel would be in her records?

## 2012-04-03 NOTE — Telephone Encounter (Signed)
Attempted to reach pt and let her know that previous notes are in EPIC and received message that home/cell# is no longer in service. Attemtped to reach pt at work and was told pt is out on medical leave.

## 2012-04-24 ENCOUNTER — Ambulatory Visit: Payer: Managed Care, Other (non HMO) | Admitting: Family

## 2012-04-24 DIAGNOSIS — Z0289 Encounter for other administrative examinations: Secondary | ICD-10-CM

## 2012-05-04 ENCOUNTER — Other Ambulatory Visit: Payer: Self-pay | Admitting: Family

## 2012-05-04 NOTE — Telephone Encounter (Signed)
30 day supply tribenzor sent to pharmacy. Pt was due for follow up this month, please call pt to r/s missed appt.

## 2012-05-06 NOTE — Telephone Encounter (Signed)
Informed patient of med refill and she scheduled follow up for 06/05/12

## 2012-05-28 ENCOUNTER — Other Ambulatory Visit: Payer: Self-pay | Admitting: Family

## 2012-05-28 DIAGNOSIS — N6310 Unspecified lump in the right breast, unspecified quadrant: Secondary | ICD-10-CM

## 2012-06-05 ENCOUNTER — Encounter: Payer: Self-pay | Admitting: Family

## 2012-06-05 ENCOUNTER — Other Ambulatory Visit: Payer: Self-pay | Admitting: Family

## 2012-06-05 ENCOUNTER — Ambulatory Visit (INDEPENDENT_AMBULATORY_CARE_PROVIDER_SITE_OTHER): Payer: Managed Care, Other (non HMO) | Admitting: Family

## 2012-06-05 VITALS — BP 148/100 | HR 56 | Temp 98.0°F | Resp 16 | Ht 62.0 in | Wt 254.1 lb

## 2012-06-05 DIAGNOSIS — G5600 Carpal tunnel syndrome, unspecified upper limb: Secondary | ICD-10-CM

## 2012-06-05 DIAGNOSIS — I10 Essential (primary) hypertension: Secondary | ICD-10-CM

## 2012-06-05 DIAGNOSIS — G5601 Carpal tunnel syndrome, right upper limb: Secondary | ICD-10-CM

## 2012-06-05 NOTE — Progress Notes (Signed)
Subjective:    Patient ID: Jennifer [name], female    DOB: [birth year], 52 y.o.   MRN: [id number]  HPI  Jennifer [name] is a 52 yr old female who presents today for follow up of her hypertension.  Denies CP/SOB. Occasional ankle edema. Denies edema today.   Carpal tunnel- last visit she was referred to orthopedics. She had a right carpal tunnel release with Dr. Cleophas Dunker 02/27/12.  Reports symptoms are improving.   Review of Systems See HPI  Past Medical History  Diagnosis Date  . Chest pain 12/2007    negative ECHO, neg stress test 06/2008  . Hypertension   . Allergy     allergic rhinitis  . Hyperglycemia     borderline. A1c 5.7  12/2007  . Condyloma acuminatum     history of  . Anemia 06/2007    C-scope 06/2007: tics, neg hemocults 07/2007  . Anxiety     History   Social History  . Marital Status: Married    Spouse Name: N/A    Number of Children: 3  . Years of Education: N/A   Occupational History  .  [employer]   Social History Main Topics  . Smoking status: Former Smoker -- 0.50 packs/day for 27 years    Types: Cigarettes    Quit date: 02/12/2004  . Smokeless tobacco: Never Used  . Alcohol Use: No  . Drug Use: No  . Sexually Active: Not on file   Other Topics Concern  . Not on file   Social History Narrative   Regular exercise  No   Caffeine-- 1 daily    Past Surgical History  Procedure Laterality Date  . Cesarean section    . Abdominal hysterectomy      no oophorectomy  . Breast surgery  age 42    Fibroid tumor removed right breast  . Cholecystectomy      Family History  Problem Relation Age of Onset  . Diabetes Mother   . Coronary artery disease Father   . Kidney disease Father   . Diabetes Father   . Cancer Father     prostate  . Hypertension Father   . Coronary artery disease Sister   . Kidney disease Sister   . Diabetes Sister   . Kidney disease Brother   . Stroke Brother   . Cancer Paternal Aunt     breast  . Diabetes  Maternal Grandmother   . Stroke Maternal Grandmother   . Diabetes Paternal Grandmother   . Stroke Paternal Grandmother   . Coronary artery disease Other     grandmother, uncle  . Kidney disease Other     grandmother  . Stroke Other     uncle  . Thyroid disease Other     aunt--hyperthyroid; aunt--hypothyroid  . Glaucoma Other     grandmother  . Hypertension Other     GM, F, sis, 3 bro  . Diabetes Other     aunts  . Cancer Cousin     cervical  . Colon cancer Neg Hx     Allergies  Allergen Reactions  . Codeine     REACTION: nausea    Current Outpatient Prescriptions on File Prior to Visit  Medication Sig Dispense Refill  . aspirin 81 MG chewable tablet Chew 1 tablet (81 mg total) by mouth daily.  30 tablet  0  . carvedilol (COREG) 25 MG tablet Take 1 tablet (25 mg total) by mouth 2 (two) times daily with a meal.  30 tablet  0  . furosemide (LASIX) 20 MG tablet Take 1 tablet (20 mg total) by mouth daily.  30 tablet  3  . Multiple Vitamins-Minerals (WOMENS MULTIVITAMIN PLUS) TABS Take 1 tablet by mouth daily.       . potassium chloride SA (K-DUR,KLOR-CON) 20 MEQ tablet Take 1 tablet (20 mEq total) by mouth daily.  30 tablet  2  . TRIBENZOR 20-5-12.5 MG TABS TAKE 1 TABLET BY MOUTH ONCE DAILY  30 tablet  0  . atorvastatin (LIPITOR) 20 MG tablet Take 0.5 tablets (10 mg total) by mouth daily.  30 tablet  0   No current facility-administered medications on file prior to visit.    BP 148/100  Pulse 56  Temp(Src) 98 F (36.7 C) (Oral)  Resp 16  Ht 5\' 2"  (1.575 m)  Wt 254 lb 1.3 oz (115.25 kg)  BMI 46.46 kg/m2  SpO2 97%       Objective:   Physical Exam  Constitutional: She is oriented to person, place, and time. She appears well-developed and well-nourished. No distress.  HENT:  Head: Normocephalic and atraumatic.  Cardiovascular: Normal rate and regular rhythm.   No murmur heard. Pulmonary/Chest: Effort normal and breath sounds normal. No respiratory distress. She  has no wheezes. She has no rales. She exhibits no tenderness.  Neurological: She is alert and oriented to person, place, and time.  Skin: Skin is warm and dry.  Psychiatric: She has a normal mood and affect. Her behavior is normal. Judgment and thought content normal.          Assessment & Plan:

## 2012-06-05 NOTE — Patient Instructions (Addendum)
Please check your blood pressure once daily for 1 week an send me readings on mychart. Follow up in 3 months.

## 2012-06-05 NOTE — Assessment & Plan Note (Addendum)
BP Readings from Last 3 Encounters:  06/05/12 148/100  01/17/12 136/90  01/03/12 160/110   Reports readings much better outside of the office.  Reports reading of 138/85 at work.  I have asked her to check bp daily x 1 week and contact me with these readings.

## 2012-06-07 NOTE — Assessment & Plan Note (Signed)
Clinically improved following CTR.

## 2012-06-19 ENCOUNTER — Ambulatory Visit
Admission: RE | Admit: 2012-06-19 | Discharge: 2012-06-19 | Disposition: A | Discharge status: Home / Self Care | Payer: Managed Care, Other (non HMO) | Source: Ambulatory Visit | Attending: Family | Admitting: Family

## 2012-06-19 ENCOUNTER — Other Ambulatory Visit: Payer: Self-pay | Admitting: Family

## 2012-06-19 DIAGNOSIS — N6310 Unspecified lump in the right breast, unspecified quadrant: Secondary | ICD-10-CM

## 2012-08-21 ENCOUNTER — Ambulatory Visit (INDEPENDENT_AMBULATORY_CARE_PROVIDER_SITE_OTHER): Payer: Managed Care, Other (non HMO) | Admitting: Family Medicine

## 2012-08-21 ENCOUNTER — Telehealth: Payer: Self-pay | Admitting: Family

## 2012-08-21 ENCOUNTER — Encounter: Payer: Self-pay | Admitting: Family Medicine

## 2012-08-21 VITALS — BP 137/92 | HR 80 | Temp 97.7°F | Resp 18 | Ht 62.0 in | Wt 256.0 lb

## 2012-08-21 DIAGNOSIS — G43C0 Periodic headache syndromes in child or adult, not intractable: Secondary | ICD-10-CM

## 2012-08-21 DIAGNOSIS — G43809 Other migraine, not intractable, without status migrainosus: Secondary | ICD-10-CM

## 2012-08-21 MED ORDER — TRAMADOL HCL 50 MG PO TABS: ORAL_TABLET | ORAL | Status: DC

## 2012-08-21 NOTE — Telephone Encounter (Signed)
Patient Information:  Caller Name: Ladelle  Phone: [phone]  Patient: Jennifer [name], Jennifer [name]  Gender: Female  DOB: [birth year]  Age: 52 Years  PCP: Sandford Craze (Adults only)  Pregnant: No  Office Follow Up:  Does the office need to follow up with this patient?: Yes  Instructions For The Office: Appts are full. Please advise if any openings   Symptoms  Reason For Call & Symptoms: Pt has been vomiting this am. Pt has vomited x 2 this am. Pt also has a h/a which she has had intermittently x 1 week. Pt has an old hx of having migraines when she was younger and taking bcp.  Reviewed Health History In EMR: Yes  Reviewed Medications In EMR: Yes  Reviewed Allergies In EMR: Yes  Reviewed Surgeries / Procedures: Yes  Date of Onset of Symptoms: 08/14/2012 OB / GYN:  LMP: Unknown  Guideline(s) Used:  Vomiting  Disposition Per Guideline:   See Today in Office  Reason For Disposition Reached:   Patient wants to be seen  Advice Given:  N/A  Patient Will Follow Care Advice:  YES
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
## 2012-08-21 NOTE — Assessment & Plan Note (Signed)
Some migrainous features (intensity and photophobia, one period of nausea+ vomited x 1).  No aura. I think her HA's are stress/anxiety/mood-induced.  She talked some today about her strained relationship with her husband and she did say she was "a tiny bit" depressed and was "close" to the point of wanting to talk to a counselor or psychologist about things.  I recommended she bring up the possibility of couples/marriage counseling with her husband and she said she would. Will treat HAs with tramadol 50-100mg  q 6h prn, #30, no RF.  I think this is a short term process and if HA's persist then will d/c use of tramadol as abortive treatment. Signs/symptoms to call or return for were reviewed and pt expressed understanding.

## 2012-08-21 NOTE — Telephone Encounter (Signed)
Scheduled pt appt with Dr Milinda Cave at 3:30pm today in [facility].

## 2012-09-11 ENCOUNTER — Encounter: Payer: Self-pay | Admitting: Family

## 2012-09-11 ENCOUNTER — Encounter: Payer: Self-pay | Admitting: Gastroenterology

## 2012-09-11 ENCOUNTER — Ambulatory Visit (INDEPENDENT_AMBULATORY_CARE_PROVIDER_SITE_OTHER): Payer: Managed Care, Other (non HMO) | Admitting: Family

## 2012-09-11 VITALS — BP 138/88 | HR 73 | Temp 97.9°F | Resp 18 | Ht 62.0 in | Wt 260.1 lb

## 2012-09-11 DIAGNOSIS — I10 Essential (primary) hypertension: Secondary | ICD-10-CM

## 2012-09-11 DIAGNOSIS — R1013 Epigastric pain: Secondary | ICD-10-CM

## 2012-09-11 DIAGNOSIS — E785 Hyperlipidemia, unspecified: Secondary | ICD-10-CM

## 2012-09-11 LAB — HEPATIC FUNCTION PANEL
Bilirubin, Direct: 0.1 mg/dL (ref 0.0–0.3)
Indirect Bilirubin: 0.5 mg/dL (ref 0.0–0.9)

## 2012-09-11 LAB — BASIC METABOLIC PANEL
BUN: 13 mg/dL (ref 6–23)
Chloride: 103 mEq/L (ref 96–112)
Creat: 0.77 mg/dL (ref 0.50–1.10)
Glucose, Bld: 99 mg/dL (ref 70–99)
Potassium: 3.7 mEq/L (ref 3.5–5.3)

## 2012-09-11 LAB — LIPID PANEL
LDL Cholesterol: 106 mg/dL — ABNORMAL HIGH (ref 0–99)
VLDL: 13 mg/dL (ref 0–40)

## 2012-09-11 MED ORDER — OMEPRAZOLE 40 MG PO CPDR: 40.0000 mg | DELAYED_RELEASE_CAPSULE | Freq: Every day | ORAL | Status: DC

## 2012-09-11 NOTE — Assessment & Plan Note (Signed)
?   Gastritis, ? PUD.  Will obtain LFT, lipase. Add omeprazole. Refer to GI for further evaluation.

## 2012-09-11 NOTE — Patient Instructions (Addendum)
Please complete your lab work prior to leaving.   You will be contact about your referral.  Please let us know if you have not heard back within 1 week about your referral. Call if abdominal pain worsens or if not improved in 1 week.  Follow up with [place] in 2  months.

## 2012-09-11 NOTE — Assessment & Plan Note (Signed)
BP Readings from Last 3 Encounters:  09/11/12 138/88  08/21/12 137/92  06/05/12 148/100   BP is stable on current meds.  Continue same.

## 2012-09-11 NOTE — Progress Notes (Signed)
Subjective:    Patient ID: Jennifer [name], female    DOB: 04-28-1960, 52 y.o.   MRN: [id number]  HPI  Jennifer [name] is a 52 yr old female who presents today for follow up.  1) HTN-  Currently maintained on coreg, tribenzor. She is no longer taking lasix.   2) Hyperlipidemia- not currently on lipitor.  3) abdominal pain- reports post prandial epigastric abdominal pain. She is s/p cholecystectomy. Reports that this started several weeks ago around the time of her Headach (she saw Dr. Marvel Plan).    Review of Systems See HPI  Past Medical History  Diagnosis Date  . Chest pain 12/2007    negative ECHO, neg stress test 06/2008  . Hypertension   . Allergy     allergic rhinitis  . Hyperglycemia     borderline. A1c 5.7  12/2007  . Condyloma acuminatum     history of  . Anemia 06/2007    C-scope 06/2007: tics, neg hemocults 07/2007  . Anxiety     History   Social History  . Marital Status: Married    Spouse Name: N/A    Number of Children: 3  . Years of Education: N/A   Occupational History  .  [employer]   Social History Main Topics  . Smoking status: Former Smoker -- 0.50 packs/day for 27 years    Types: Cigarettes    Quit date: 02/12/2004  . Smokeless tobacco: Never Used  . Alcohol Use: No  . Drug Use: No  . Sexually Active: Not on file   Other Topics Concern  . Not on file   Social History Narrative   Regular exercise  No   Caffeine-- 1 daily    Past Surgical History  Procedure Laterality Date  . Cesarean section    . Abdominal hysterectomy      no oophorectomy  . Breast surgery  age 67    Fibroid tumor removed right breast  . Cholecystectomy      Family History  Problem Relation Age of Onset  . Diabetes Mother   . Coronary artery disease Father   . Kidney disease Father   . Diabetes Father   . Cancer Father     prostate  . Hypertension Father   . Coronary artery disease Sister   . Kidney disease Sister   . Diabetes Sister   . Kidney  disease Brother   . Stroke Brother   . Cancer Paternal Aunt     breast  . Diabetes Maternal Grandmother   . Stroke Maternal Grandmother   . Diabetes Paternal Grandmother   . Stroke Paternal Grandmother   . Coronary artery disease Other     grandmother, uncle  . Kidney disease Other     grandmother  . Stroke Other     uncle  . Thyroid disease Other     aunt--hyperthyroid; aunt--hypothyroid  . Glaucoma Other     grandmother  . Hypertension Other     GM, F, sis, 3 bro  . Diabetes Other     aunts  . Cancer Cousin     cervical  . Colon cancer Neg Hx     Allergies  Allergen Reactions  . Codeine     REACTION: nausea    Current Outpatient Prescriptions on File Prior to Visit  Medication Sig Dispense Refill  . aspirin 81 MG chewable tablet Chew 1 tablet (81 mg total) by mouth daily.  30 tablet  0  . carvedilol (COREG) 25 MG tablet  TAKE 1 TABLET (25 MG TOTAL) BY MOUTH 2 (TWO) TIMES DAILY WITH A MEAL.  60 tablet  4  . Multiple Vitamins-Minerals (WOMENS MULTIVITAMIN PLUS) TABS Take 1 tablet by mouth daily.       . potassium chloride SA (K-DUR,KLOR-CON) 20 MEQ tablet Take 1 tablet (20 mEq total) by mouth daily.  30 tablet  2  . TRIBENZOR 20-5-12.5 MG TABS TAKE 1 TABLET BY MOUTH ONCE DAILY  30 tablet  4  . atorvastatin (LIPITOR) 20 MG tablet Take 0.5 tablets (10 mg total) by mouth daily.  30 tablet  0  . furosemide (LASIX) 20 MG tablet Take 1 tablet (20 mg total) by mouth daily.  30 tablet  3   No current facility-administered medications on file prior to visit.    BP 138/88  Pulse 73  Temp(Src) 97.9 F (36.6 C) (Oral)  Resp 18  Ht 5\' 2"  (1.575 m)  Wt 260 lb 1.3 oz (117.972 kg)  BMI 47.56 kg/m2  SpO2 97%       Objective:   Physical Exam  Constitutional: She is oriented to person, place, and time. She appears well-developed and well-nourished. No distress.  Cardiovascular: Normal rate and regular rhythm.   No murmur heard. Pulmonary/Chest: Effort normal and breath  sounds normal. No respiratory distress. She has no wheezes. She has no rales. She exhibits no tenderness.  Abdominal:  Abdomen is soft. + BS noted.  + epigastric tenderness to palpation without guarding.  Musculoskeletal: She exhibits no edema.  Neurological: She is alert and oriented to person, place, and time.  Psychiatric: She has a normal mood and affect. Her behavior is normal. Judgment and thought content normal.          Assessment & Plan:

## 2012-10-16 ENCOUNTER — Ambulatory Visit (INDEPENDENT_AMBULATORY_CARE_PROVIDER_SITE_OTHER): Payer: Managed Care, Other (non HMO) | Admitting: Gastroenterology

## 2012-10-16 ENCOUNTER — Ambulatory Visit (AMBULATORY_SURGERY_CENTER): Payer: Managed Care, Other (non HMO) | Admitting: Gastroenterology

## 2012-10-16 ENCOUNTER — Encounter: Payer: Self-pay | Admitting: Gastroenterology

## 2012-10-16 VITALS — BP 168/107 | HR 71 | Temp 98.7°F | Resp 22 | Ht 62.0 in | Wt 258.0 lb

## 2012-10-16 VITALS — BP 124/82 | HR 75 | Ht 62.0 in | Wt 258.4 lb

## 2012-10-16 DIAGNOSIS — K219 Gastro-esophageal reflux disease without esophagitis: Secondary | ICD-10-CM

## 2012-10-16 DIAGNOSIS — R1013 Epigastric pain: Secondary | ICD-10-CM

## 2012-10-16 DIAGNOSIS — R1310 Dysphagia, unspecified: Secondary | ICD-10-CM

## 2012-10-16 MED ORDER — SODIUM CHLORIDE 0.9 % IV SOLN: 500.0000 mL | INTRAVENOUS | Status: DC

## 2012-10-16 NOTE — Progress Notes (Signed)
Procedure ends, to recovery, report given and VSS. 

## 2012-10-16 NOTE — Patient Instructions (Addendum)
YOU HAD AN ENDOSCOPIC PROCEDURE TODAY AT THE [facility]: Refer to the procedure report that was given to you for any specific questions about what was found during the examination.  If the procedure report does not answer your questions, please call your gastroenterologist to clarify.  If you requested that your care partner not be given the details of your procedure findings, then the procedure report has been included in a sealed envelope for you to review at your convenience later.  YOU SHOULD EXPECT: Some feelings of bloating in the abdomen. Passage of more gas than usual.  Walking can help get rid of the air that was put into your GI tract during the procedure and reduce the bloating. If you had a lower endoscopy (such as a colonoscopy or flexible sigmoidoscopy) you may notice spotting of blood in your stool or on the toilet paper. If you underwent a bowel prep for your procedure, then you may not have a normal bowel movement for a few days.  DIET: Your first meal following the procedure should be a light meal and then it is ok to progress to your normal diet.  A half-sandwich or bowl of soup is an example of a good first meal.  Heavy or fried foods are harder to digest and may make you feel nauseous or bloated.  Likewise meals heavy in dairy and vegetables can cause extra gas to form and this can also increase the bloating.  Drink plenty of fluids but you should avoid alcoholic beverages for 24 hours.  ACTIVITY: Your care partner should take you home directly after the procedure.  You should plan to take it easy, moving slowly for the rest of the day.  You can resume normal activity the day after the procedure however you should NOT DRIVE or use heavy machinery for 24 hours (because of the sedation medicines used during the test).    SYMPTOMS TO REPORT IMMEDIATELY: A gastroenterologist can be reached at any hour.  During normal business hours, 8:30 AM to 5:00 PM Monday through Friday,  call [phone].  After hours and on weekends, please call the GI answering service at [phone] who will take a message and have the physician on call contact you.   Following upper endoscopy (EGD)  Vomiting of blood or coffee ground material  New chest pain or pain under the shoulder blades  Painful or persistently difficult swallowing  New shortness of breath  Fever of 100F or higher  Black, tarry-looking stools  FOLLOW UP: If any biopsies were taken you will be contacted by phone or by letter within the next 1-3 weeks.  Call your gastroenterologist if you have not heard about the biopsies in 3 weeks.  Our staff will call the home number listed on your records the next business day following your procedure to check on you and address any questions or concerns that you may have at that time regarding the information given to you following your procedure. This is a courtesy call and so if there is no answer at the home number and we have not heard from you through the emergency physician on call, we will assume that you have returned to your regular daily activities without incident.  SIGNATURES/CONFIDENTIALITY: You and/or your care partner have signed paperwork which will be entered into your electronic medical record.  These signatures attest to the fact that that the information above on your After Visit Summary has been reviewed and is understood.  Full responsibility   of the confidentiality of this discharge information lies with you and/or your care-partner.  Recommendations See procedure report 

## 2012-10-16 NOTE — Patient Instructions (Addendum)

## 2012-10-16 NOTE — Op Note (Addendum)
[facility] [address] [place], [address]   ENDOSCOPY PROCEDURE REPORT  PATIENT: Jennifer, [name]  MR#: [id number] BIRTHDATE: Sep 05, 1960 , 51  yrs. old GENDER: Female ENDOSCOPIST: Louis Meckel, MD REFERRED BY:  Sandford Craze, FNP PROCEDURE DATE:  10/16/2012 PROCEDURE:  EGD, diagnostic ASA CLASS:     Class II INDICATIONS:  Dysphagia.   Heartburn. MEDICATIONS: MAC sedation, administered by CRNA, Propofol (Diprivan) 120 mg IV, and Simethicone 0.6cc PO TOPICAL ANESTHETIC:  DESCRIPTION OF PROCEDURE: After the risks benefits and alternatives of the procedure were thoroughly explained, informed consent was obtained.  The Pentax Gastroscope [id number] endoscope was introduced through the mouth and advanced to the third portion of the duodenum. Without limitations.  The instrument was slowly withdrawn as the mucosa was fully examined.      The upper, middle and distal third of the esophagus were carefully inspected and no abnormalities were noted.  The z-line was well seen at the GEJ.  The endoscope was pushed into the fundus which was normal including a retroflexed view.  The antrum, gastric body, first and second part of the duodenum were unremarkable. Retroflexed views revealed no abnormalities.     The scope was then withdrawn from the patient and the procedure completed.  COMPLICATIONS: There were no complications. ENDOSCOPIC IMPRESSION: Normal EGD  RECOMMENDATIONS: Continue omeprazole OV 6 weeks  REPEAT EXAM:  eSigned:  Louis Meckel, MD 10/16/2012 3:47 PM   CC:

## 2012-10-16 NOTE — Assessment & Plan Note (Signed)
Postprandial epigastric pain could be do to ulcer or nonulcer dyspepsia.  She's clearly had a response to omeprazole.  Plan to continue with the same

## 2012-10-16 NOTE — Progress Notes (Signed)
History of Present Illness: 52 year old Afro-American female referred at the request of Dr. Lendell Caprice for evaluation of pyrosis, abdominal pain and dysphagia.  She was suffering from immediate postprandial upper abdominal pain accompanied by nausea and occasional vomiting.  She was also regurgitating gastric contents after going to bed and had the need to vomit.  Pyrosis was severe.  She has mild dysphagia to solids.  Symptoms have improved significantly since starting omeprazole.  Dysphagia remains.  She's on no gastric irritants including nonsteroidals.    Past Medical History  Diagnosis Date  . Chest pain 12/2007    negative ECHO, neg stress test 06/2008  . Hypertension   . Allergy     allergic rhinitis  . Hyperglycemia     borderline. A1c 5.7  12/2007  . Condyloma acuminatum     history of  . Anemia 06/2007    C-scope 06/2007: tics, neg hemocults 07/2007  . Anxiety    Past Surgical History  Procedure Laterality Date  . Cesarean section    . Abdominal hysterectomy      no oophorectomy  . Breast surgery  age 49    Fibroid tumor removed right breast  . Cholecystectomy     family history includes Cancer in her cousin, father, and paternal aunt; Coronary artery disease in her father, other, and sister; Diabetes in her father, maternal grandmother, mother, other, paternal grandmother, and sister; Glaucoma in her other; Hypertension in her father and other; Kidney disease in her brother, father, other, and sister; Stroke in her brother, maternal grandmother, other, and paternal grandmother; Thyroid disease in her other. There is no history of Colon cancer. Current Outpatient Prescriptions  Medication Sig Dispense Refill  . aspirin 81 MG chewable tablet Chew 1 tablet (81 mg total) by mouth daily.  30 tablet  0  . carvedilol (COREG) 25 MG tablet TAKE 1 TABLET (25 MG TOTAL) BY MOUTH 2 (TWO) TIMES DAILY WITH A MEAL.  60 tablet  4  . Multiple Vitamins-Minerals (WOMENS MULTIVITAMIN PLUS) TABS Take  1 tablet by mouth daily.       Marland Kitchen omeprazole (PRILOSEC) 40 MG capsule Take 1 capsule (40 mg total) by mouth daily.  30 capsule  3  . TRIBENZOR 20-5-12.5 MG TABS TAKE 1 TABLET BY MOUTH ONCE DAILY  30 tablet  4   No current facility-administered medications for this visit.   Allergies as of 10/16/2012 - Review Complete 10/16/2012  Allergen Reaction Noted  . Codeine  07/25/2006    reports that she quit smoking about 8 years ago. Her smoking use included Cigarettes. She has a 13.5 pack-year smoking history. She has never used smokeless tobacco. She reports that she does not drink alcohol or use illicit drugs.     Review of Systems: Pertinent positive and negative review of systems were noted in the above HPI section. All other review of systems were otherwise negative.  Vital signs were reviewed in today's medical record Physical Exam: General: Well developed , well nourished, no acute distress Skin: anicteric Head: Normocephalic and atraumatic Eyes:  sclerae anicteric, EOMI Ears: Normal auditory acuity Mouth: No deformity or lesions Neck: Supple, no masses or thyromegaly Lungs: Clear throughout to auscultation Heart: Regular rate and rhythm; no murmurs, rubs or bruits Abdomen: Soft, non tender and non distended. No masses, hepatosplenomegaly or hernias noted. Normal Bowel sounds Rectal:deferred Musculoskeletal: Symmetrical with no gross deformities  Skin: No lesions on visible extremities Pulses:  Normal pulses noted Extremities: No clubbing, cyanosis, edema or deformities noted Neurological:  Alert oriented x 4, grossly nonfocal Cervical Nodes:  No significant cervical adenopathy Inguinal Nodes: No significant inguinal adenopathy Psychological:  Alert and cooperative. Normal mood and affect

## 2012-10-16 NOTE — Assessment & Plan Note (Signed)
Rule out early esophageal stricture  Recommendations #1 upper endoscopy with dilatation as indicated 

## 2012-10-16 NOTE — Progress Notes (Signed)
Patient did not have preoperative order for IV antibiotic SSI prophylaxis. (G8918)  Patient did not experience any of the following events: a burn prior to discharge; a fall within the facility; wrong site/side/patient/procedure/implant event; or a hospital transfer or hospital admission upon discharge from the facility. (G8907)  

## 2012-10-16 NOTE — Assessment & Plan Note (Signed)
Symptoms are well-controlled with omeprazole.  Plan to continue with the same. 

## 2012-10-19 ENCOUNTER — Telehealth: Payer: Self-pay | Admitting: *Deleted

## 2012-10-19 NOTE — Telephone Encounter (Signed)
  Follow up Call-  Call back number 10/16/2012  Post procedure Call Back phone  # [phone] work number do not leave message  Permission to leave phone message No     Patient questions:  Do you have a fever, pain , or abdominal swelling? no Pain Score  0 *  Have you tolerated food without any problems? yes  Have you been able to return to your normal activities? yes  Do you have any questions about your discharge instructions: Diet   no Medications  no Follow up visit  no  Do you have questions or concerns about your Care? no  Actions: * If pain score is 4 or above: No action needed, pain <4.

## 2012-11-19 ENCOUNTER — Other Ambulatory Visit: Payer: Self-pay | Admitting: Family

## 2012-11-19 NOTE — Telephone Encounter (Signed)
Rx request to pharmacy/[facility]  

## 2012-11-25 ENCOUNTER — Ambulatory Visit: Payer: Self-pay | Admitting: Gastroenterology

## 2012-12-17 ENCOUNTER — Other Ambulatory Visit: Payer: Self-pay

## 2013-01-01 ENCOUNTER — Ambulatory Visit (INDEPENDENT_AMBULATORY_CARE_PROVIDER_SITE_OTHER): Payer: Managed Care, Other (non HMO) | Admitting: Family

## 2013-01-01 ENCOUNTER — Encounter: Payer: Self-pay | Admitting: Family

## 2013-01-01 VITALS — BP 148/100 | HR 71 | Temp 98.1°F | Resp 16 | Ht 62.0 in | Wt 254.1 lb

## 2013-01-01 DIAGNOSIS — R7309 Other abnormal glucose: Secondary | ICD-10-CM

## 2013-01-01 DIAGNOSIS — I10 Essential (primary) hypertension: Secondary | ICD-10-CM

## 2013-01-01 DIAGNOSIS — D649 Anemia, unspecified: Secondary | ICD-10-CM

## 2013-01-01 MED ORDER — OLMESARTAN-AMLODIPINE-HCTZ 20-5-12.5 MG PO TABS: ORAL_TABLET | ORAL | Status: DC

## 2013-01-01 MED ORDER — CARVEDILOL 25 MG PO TABS: ORAL_TABLET | ORAL | Status: DC

## 2013-01-01 NOTE — Assessment & Plan Note (Signed)
Plan A1C next visit.  

## 2013-01-01 NOTE — Assessment & Plan Note (Signed)
Plan cbc next visit.

## 2013-01-01 NOTE — Progress Notes (Signed)
Subjective:    Patient ID: Jennifer [name], female    DOB: [birth year], 52 y.o.   MRN: [id number]  HPI  Ms. Hearns is a 52 yr old female who presents today for follow up of multiple medical problems:  1) anemia- Last Hgb 11.8.  Declines blood work today.  2) HTN- pt is written for coreg and tribenzor.  She has been out of her medication x 1 week. Reports bp 138/87-90 at home while on the medications.  She denies CP/SOB or swelling. BP Readings from Last 3 Encounters:  01/01/13 148/100  10/16/12 168/107  10/16/12 124/82   3) Hyperglycemia-  Last glucose normal. Declines blood work today. Strong family hx of DM2.   Review of Systems See HPI  Past Medical History  Diagnosis Date  . Chest pain 12/2007    negative ECHO, neg stress test 06/2008  . Hypertension   . Allergy     allergic rhinitis  . Hyperglycemia     borderline. A1c 5.7  12/2007  . Condyloma acuminatum     history of  . Anemia 06/2007    C-scope 06/2007: tics, neg hemocults 07/2007  . Anxiety     History   Social History  . Marital Status: Married    Spouse Name: N/A    Number of Children: 3  . Years of Education: N/A   Occupational History  .  [employer]   Social History Main Topics  . Smoking status: Former Smoker -- 0.50 packs/day for 27 years    Types: Cigarettes    Quit date: 02/12/2004  . Smokeless tobacco: Never Used  . Alcohol Use: No  . Drug Use: No  . Sexual Activity: Not on file   Other Topics Concern  . Not on file   Social History Narrative   Regular exercise  No   Caffeine-- 1 daily    Past Surgical History  Procedure Laterality Date  . Cesarean section    . Abdominal hysterectomy      no oophorectomy  . Breast surgery  age 30    Fibroid tumor removed right breast  . Cholecystectomy    . Carpal tunnel release      Family History  Problem Relation Age of Onset  . Diabetes Mother   . Coronary artery disease Father   . Kidney disease Father   . Diabetes Father    . Cancer Father     prostate  . Hypertension Father   . Coronary artery disease Sister   . Kidney disease Sister   . Diabetes Sister   . Kidney disease Brother   . Stroke Brother   . Cancer Paternal Aunt     breast  . Diabetes Maternal Grandmother   . Stroke Maternal Grandmother   . Diabetes Paternal Grandmother   . Stroke Paternal Grandmother   . Coronary artery disease Other     grandmother, uncle  . Kidney disease Other     grandmother  . Stroke Other     uncle  . Thyroid disease Other     aunt--hyperthyroid; aunt--hypothyroid  . Glaucoma Other     grandmother  . Hypertension Other     GM, F, sis, 3 bro  . Diabetes Other     aunts  . Cancer Cousin     cervical  . Colon cancer Neg Hx     Allergies  Allergen Reactions  . Codeine     REACTION: nausea    Current Outpatient Prescriptions on File Prior  to Visit  Medication Sig Dispense Refill  . aspirin 81 MG chewable tablet Chew 1 tablet (81 mg total) by mouth daily.  30 tablet  0  . Multiple Vitamins-Minerals (WOMENS MULTIVITAMIN PLUS) TABS Take 1 tablet by mouth daily.       Marland Kitchen omeprazole (PRILOSEC) 40 MG capsule Take 1 capsule (40 mg total) by mouth daily.  30 capsule  3   No current facility-administered medications on file prior to visit.    BP 148/100  Pulse 71  Temp(Src) 98.1 F (36.7 C) (Oral)  Resp 16  Ht 5\' 2"  (1.575 m)  Wt 254 lb 1.3 oz (115.25 kg)  BMI 46.46 kg/m2  SpO2 97%       Objective:   Physical Exam  Constitutional: She is oriented to person, place, and time. She appears well-developed and well-nourished. No distress.  Cardiovascular: Normal rate and regular rhythm.   No murmur heard. Pulmonary/Chest: Effort normal and breath sounds normal. No respiratory distress. She has no wheezes.  Neurological: She is alert and oriented to person, place, and time.  Psychiatric: She has a normal mood and affect. Her behavior is normal. Judgment and thought content normal.           Assessment & Plan:

## 2013-01-01 NOTE — Patient Instructions (Signed)
Please follow up in 3 months. Come fasting to this appointment.

## 2013-01-01 NOTE — Assessment & Plan Note (Signed)
Deteriorated.  Pt instructed to resume meds.

## 2013-01-01 NOTE — Progress Notes (Signed)
Pre visit review using our clinic review tool, if applicable. No additional management support is needed unless otherwise documented below in the visit note. 

## 2013-01-18 ENCOUNTER — Encounter: Payer: Self-pay | Admitting: Family

## 2013-02-23 ENCOUNTER — Encounter: Payer: Self-pay | Admitting: Family

## 2013-02-23 NOTE — Telephone Encounter (Signed)
Please advise 

## 2013-02-23 NOTE — Telephone Encounter (Signed)
See mychart message. Please call pt and schedule visit for 1/16.

## 2013-02-26 ENCOUNTER — Encounter: Payer: Self-pay | Admitting: Family

## 2013-02-26 ENCOUNTER — Ambulatory Visit (INDEPENDENT_AMBULATORY_CARE_PROVIDER_SITE_OTHER): Payer: Managed Care, Other (non HMO) | Admitting: Family

## 2013-02-26 VITALS — BP 118/84 | HR 64 | Temp 98.4°F | Resp 16 | Ht 62.0 in | Wt 252.0 lb

## 2013-02-26 DIAGNOSIS — R5383 Other fatigue: Secondary | ICD-10-CM

## 2013-02-26 DIAGNOSIS — R5381 Other malaise: Secondary | ICD-10-CM | POA: Insufficient documentation

## 2013-02-26 DIAGNOSIS — K219 Gastro-esophageal reflux disease without esophagitis: Secondary | ICD-10-CM

## 2013-02-26 DIAGNOSIS — R739 Hyperglycemia, unspecified: Secondary | ICD-10-CM

## 2013-02-26 DIAGNOSIS — I10 Essential (primary) hypertension: Secondary | ICD-10-CM

## 2013-02-26 DIAGNOSIS — R7309 Other abnormal glucose: Secondary | ICD-10-CM

## 2013-02-26 LAB — CBC WITH DIFFERENTIAL/PLATELET
BASOS ABS: 0 10*3/uL (ref 0.0–0.1)
BASOS PCT: 0 % (ref 0–1)
EOS ABS: 0.1 10*3/uL (ref 0.0–0.7)
Eosinophils Relative: 2 % (ref 0–5)
HCT: 36.3 % (ref 36.0–46.0)
HEMOGLOBIN: 12 g/dL (ref 12.0–15.0)
Lymphocytes Relative: 53 % — ABNORMAL HIGH (ref 12–46)
Lymphs Abs: 2.7 10*3/uL (ref 0.7–4.0)
MCH: 29.3 pg (ref 26.0–34.0)
MCHC: 33.1 g/dL (ref 30.0–36.0)
MCV: 88.8 fL (ref 78.0–100.0)
MONOS PCT: 9 % (ref 3–12)
Monocytes Absolute: 0.5 10*3/uL (ref 0.1–1.0)
NEUTROS PCT: 36 % — AB (ref 43–77)
Neutro Abs: 1.8 10*3/uL (ref 1.7–7.7)
Platelets: 346 10*3/uL (ref 150–400)
RBC: 4.09 MIL/uL (ref 3.87–5.11)
RDW: 14.7 % (ref 11.5–15.5)
WBC: 5.1 10*3/uL (ref 4.0–10.5)

## 2013-02-26 LAB — BASIC METABOLIC PANEL WITH GFR
BUN: 12 mg/dL (ref 6–23)
CALCIUM: 9.3 mg/dL (ref 8.4–10.5)
CO2: 29 mEq/L (ref 19–32)
CREATININE: 0.83 mg/dL (ref 0.50–1.10)
Chloride: 101 mEq/L (ref 96–112)
GFR, EST NON AFRICAN AMERICAN: 80 mL/min
GLUCOSE: 93 mg/dL (ref 70–99)
POTASSIUM: 4 meq/L (ref 3.5–5.3)
Sodium: 137 mEq/L (ref 135–145)

## 2013-02-26 LAB — HEMOGLOBIN A1C: Hgb A1c MFr Bld: <4 % (ref <5.7)

## 2013-02-26 MED ORDER — OLMESARTAN-AMLODIPINE-HCTZ 20-5-12.5 MG PO TABS: ORAL_TABLET | ORAL | Status: DC

## 2013-02-26 MED ORDER — CARVEDILOL 25 MG PO TABS: ORAL_TABLET | ORAL | Status: DC

## 2013-02-26 NOTE — Progress Notes (Signed)
Subjective:    Patient ID: Jennifer [name], female    DOB: 1960-03-18, 53 y.o.   MRN: [id number]  HPI  Jennifer [name] is a 53 yr old female who presents today for follow up of multiple medical problems:  1) HTN- Pt is maintained on tribenzor. She denies CP, SOB, or headache.   BP Readings from Last 3 Encounters:  02/26/13 118/84  01/01/13 148/100  10/16/12 168/107   2)Hyperglycemia-   Lab Results  Component Value Date   HGBA1C 5.7* 11/09/2010   3) Fatigue- pt reports flu like symptoms earlier this month.  Symptoms have resolved but now she continues with fatigue.  4) anemia- declined blood work 11/21 Lab Results  Component Value Date   WBC 4.8 12/17/2011   HGB 11.8* 12/17/2011   HCT 35.9* 12/17/2011   MCV 88.0 12/17/2011   PLT 300 12/17/2011   5) GERD- well controlled off of prilosec.  Review of Systems See HPI  Past Medical History  Diagnosis Date  . Chest pain 12/2007    negative ECHO, neg stress test 06/2008  . Hypertension   . Allergy     allergic rhinitis  . Hyperglycemia     borderline. A1c 5.7  12/2007  . Condyloma acuminatum     history of  . Anemia 06/2007    C-scope 06/2007: tics, neg hemocults 07/2007  . Anxiety     History   Social History  . Marital Status: Married    Spouse Name: N/A    Number of Children: 3  . Years of Education: N/A   Occupational History  .  [employer]   Social History Main Topics  . Smoking status: Former Smoker -- 0.50 packs/day for 27 years    Types: Cigarettes    Quit date: 02/12/2004  . Smokeless tobacco: Never Used  . Alcohol Use: No  . Drug Use: No  . Sexual Activity: Not on file   Other Topics Concern  . Not on file   Social History Narrative   Regular exercise  No   Caffeine-- 1 daily    Past Surgical History  Procedure Laterality Date  . Cesarean section    . Abdominal hysterectomy      no oophorectomy  . Breast surgery  age 1    Fibroid tumor removed right breast  . Cholecystectomy    .  Carpal tunnel release      Family History  Problem Relation Age of Onset  . Diabetes Mother   . Coronary artery disease Father   . Kidney disease Father   . Diabetes Father   . Cancer Father     prostate  . Hypertension Father   . Coronary artery disease Sister   . Kidney disease Sister   . Diabetes Sister   . Kidney disease Brother   . Stroke Brother   . Cancer Paternal Aunt     breast  . Diabetes Maternal Grandmother   . Stroke Maternal Grandmother   . Diabetes Paternal Grandmother   . Stroke Paternal Grandmother   . Coronary artery disease Other     grandmother, uncle  . Kidney disease Other     grandmother  . Stroke Other     uncle  . Thyroid disease Other     aunt--hyperthyroid; aunt--hypothyroid  . Glaucoma Other     grandmother  . Hypertension Other     GM, F, sis, 3 bro  . Diabetes Other     aunts  . Cancer Cousin  cervical  . Colon cancer Neg Hx     Allergies  Allergen Reactions  . Codeine     REACTION: nausea    Current Outpatient Prescriptions on File Prior to Visit  Medication Sig Dispense Refill  . carvedilol (COREG) 25 MG tablet TAKE 1 TABLET (25 MG TOTAL) BY MOUTH 2 (TWO) TIMES DAILY WITH A MEAL.  60 tablet  3  . Multiple Vitamins-Minerals (WOMENS MULTIVITAMIN PLUS) TABS Take 1 tablet by mouth daily.       . Olmesartan-Amlodipine-HCTZ (TRIBENZOR) 20-5-12.5 MG TABS TAKE 1 TABLET BY MOUTH ONCE DAILY  30 tablet  3  . aspirin 81 MG chewable tablet Chew 1 tablet (81 mg total) by mouth daily.  30 tablet  0  . omeprazole (PRILOSEC) 40 MG capsule Take 1 capsule (40 mg total) by mouth daily.  30 capsule  3   No current facility-administered medications on file prior to visit.    BP 118/84  Pulse 64  Temp(Src) 98.4 F (36.9 C) (Oral)  Resp 16  Ht 5\' 2"  (1.575 m)  Wt 252 lb (114.306 kg)  BMI 46.08 kg/m2  SpO2 96%       Objective:   Physical Exam  Constitutional: She is oriented to person, place, and time. She appears well-developed and  well-nourished. No distress.  Cardiovascular: Normal rate and regular rhythm.   No murmur heard. Pulmonary/Chest: Effort normal and breath sounds normal. No respiratory distress. She has no wheezes. She has no rales. She exhibits no tenderness.  Musculoskeletal: She exhibits no edema.  Neurological: She is alert and oriented to person, place, and time.  Psychiatric: She has a normal mood and affect. Her behavior is normal. Judgment and thought content normal.          Assessment & Plan:

## 2013-02-26 NOTE — Assessment & Plan Note (Signed)
Obtain CBC and TSH.

## 2013-02-26 NOTE — Progress Notes (Signed)
Pre visit review using our clinic review tool, if applicable. No additional management support is needed unless otherwise documented below in the visit note. 

## 2013-02-26 NOTE — Patient Instructions (Signed)
Please complete lab work prior to leaving. Follow up in 3 months.  

## 2013-02-26 NOTE — Assessment & Plan Note (Signed)
Stable off PPI, monitor.

## 2013-02-26 NOTE — Assessment & Plan Note (Signed)
BP stable on current meds.  Obtain bmet. 

## 2013-02-26 NOTE — Assessment & Plan Note (Signed)
Clinically stable, obtain A1C.

## 2013-02-27 ENCOUNTER — Telehealth: Payer: Self-pay | Admitting: Family

## 2013-02-27 LAB — TSH: TSH: 2.125 u[IU]/mL (ref 0.350–4.500)

## 2013-02-27 NOTE — Telephone Encounter (Signed)
Relevant patient education assigned to patient using Emmi. ° °

## 2013-02-28 ENCOUNTER — Encounter: Payer: Self-pay | Admitting: Family

## 2013-04-09 ENCOUNTER — Ambulatory Visit: Payer: Self-pay | Admitting: Family

## 2013-07-14 ENCOUNTER — Telehealth: Payer: Self-pay | Admitting: *Deleted

## 2013-07-14 MED ORDER — OLMESARTAN-AMLODIPINE-HCTZ 20-5-12.5 MG PO TABS: ORAL_TABLET | ORAL | Status: DC

## 2013-07-14 NOTE — Telephone Encounter (Signed)
Received call from Chad at [facility] stating pt is currently without ins and tribenzor cost $145 with discount card. Pt unable to afford medication and requests samples. Advised pharmacy that we have samples available. Pt is 3 months past due for follow up and needs to scheduled an appt. Follow up scheduled.

## 2013-08-02 ENCOUNTER — Encounter: Payer: Self-pay | Admitting: Family

## 2013-08-02 ENCOUNTER — Ambulatory Visit (INDEPENDENT_AMBULATORY_CARE_PROVIDER_SITE_OTHER): Payer: Managed Care, Other (non HMO) | Admitting: Family

## 2013-08-02 VITALS — BP 130/86 | HR 72 | Temp 98.0°F | Ht 62.0 in | Wt 253.0 lb

## 2013-08-02 DIAGNOSIS — R5381 Other malaise: Secondary | ICD-10-CM

## 2013-08-02 DIAGNOSIS — R5383 Other fatigue: Secondary | ICD-10-CM

## 2013-08-02 DIAGNOSIS — I10 Essential (primary) hypertension: Secondary | ICD-10-CM

## 2013-08-02 DIAGNOSIS — D649 Anemia, unspecified: Secondary | ICD-10-CM

## 2013-08-02 DIAGNOSIS — R7309 Other abnormal glucose: Secondary | ICD-10-CM

## 2013-08-02 LAB — BASIC METABOLIC PANEL
BUN: 15 mg/dL (ref 6–23)
CALCIUM: 9.2 mg/dL (ref 8.4–10.5)
CO2: 27 mEq/L (ref 19–32)
CREATININE: 0.8 mg/dL (ref 0.50–1.10)
Chloride: 103 mEq/L (ref 96–112)
Glucose, Bld: 114 mg/dL — ABNORMAL HIGH (ref 70–99)
Potassium: 3.9 mEq/L (ref 3.5–5.3)
Sodium: 138 mEq/L (ref 135–145)

## 2013-08-02 MED ORDER — OLMESARTAN-AMLODIPINE-HCTZ 20-5-12.5 MG PO TABS: ORAL_TABLET | ORAL | Status: DC

## 2013-08-02 NOTE — Progress Notes (Signed)
Subjective:    Patient ID: Jennifer [name], female    DOB: 1960/09/23, 53 y.o.   MRN: [id number]  HPI  Jennifer [name] is a 53 yr old female who presents today for follow up.  1) HTN- currently maintained on coreg and tribenzor. She is without insurance.    2) Hyperglycemia-Last A1C was normal. Lab Results  Component Value Date   HGBA1C <4.0 02/26/2013   3) Fatigue- pt reports that she is back at baseline in regards to fatigue.  4) Anemia- follow up blood count last visit was normal.   Review of Systems See HPI  Past Medical History  Diagnosis Date  . Chest pain 12/2007    negative ECHO, neg stress test 06/2008  . Hypertension   . Allergy     allergic rhinitis  . Hyperglycemia     borderline. A1c 5.7  12/2007  . Condyloma acuminatum     history of  . Anemia 06/2007    C-scope 06/2007: tics, neg hemocults 07/2007  . Anxiety     History   Social History  . Marital Status: Married    Spouse Name: N/A    Number of Children: 3  . Years of Education: N/A   Occupational History  .  [employer]   Social History Main Topics  . Smoking status: Former Smoker -- 0.50 packs/day for 27 years    Types: Cigarettes    Quit date: 02/12/2004  . Smokeless tobacco: Never Used  . Alcohol Use: No  . Drug Use: No  . Sexual Activity: Not on file   Other Topics Concern  . Not on file   Social History Narrative   Regular exercise  No   Caffeine-- 1 daily    Past Surgical History  Procedure Laterality Date  . Cesarean section    . Abdominal hysterectomy      no oophorectomy  . Breast surgery  age 57    Fibroid tumor removed right breast  . Cholecystectomy    . Carpal tunnel release      Family History  Problem Relation Age of Onset  . Diabetes Mother   . Coronary artery disease Father   . Kidney disease Father   . Diabetes Father   . Cancer Father     prostate  . Hypertension Father   . Coronary artery disease Sister   . Kidney disease Sister   . Diabetes  Sister   . Kidney disease Brother   . Stroke Brother   . Cancer Paternal Aunt     breast  . Diabetes Maternal Grandmother   . Stroke Maternal Grandmother   . Diabetes Paternal Grandmother   . Stroke Paternal Grandmother   . Coronary artery disease Other     grandmother, uncle  . Kidney disease Other     grandmother  . Stroke Other     uncle  . Thyroid disease Other     aunt--hyperthyroid; aunt--hypothyroid  . Glaucoma Other     grandmother  . Hypertension Other     GM, F, sis, 3 bro  . Diabetes Other     aunts  . Cancer Cousin     cervical  . Colon cancer Neg Hx     Allergies  Allergen Reactions  . Codeine     REACTION: nausea    Current Outpatient Prescriptions on File Prior to Visit  Medication Sig Dispense Refill  . aspirin 81 MG chewable tablet Chew 1 tablet (81 mg total) by mouth daily.  30 tablet  0  . carvedilol (COREG) 25 MG tablet TAKE 1 TABLET (25 MG TOTAL) BY MOUTH 2 (TWO) TIMES DAILY WITH A MEAL.  180 tablet  1  . Multiple Vitamins-Minerals (WOMENS MULTIVITAMIN PLUS) TABS Take 1 tablet by mouth daily.        No current facility-administered medications on file prior to visit.    BP 130/86  Pulse 72  Temp(Src) 98 F (36.7 C) (Oral)  Ht 5\' 2"  (1.575 m)  Wt 253 lb (114.76 kg)  BMI 46.26 kg/m2  SpO2 97%       Objective:   Physical Exam  Constitutional: She is oriented to person, place, and time. She appears well-developed and well-nourished. No distress.  HENT:  Head: Normocephalic and atraumatic.  Cardiovascular: Normal rate and regular rhythm.   No murmur heard. Pulmonary/Chest: Effort normal and breath sounds normal. No respiratory distress. She has no wheezes. She has no rales. She exhibits no tenderness.  Neurological: She is alert and oriented to person, place, and time.  Psychiatric: She has a normal mood and affect. Her behavior is normal. Judgment and thought content normal.          Assessment & Plan:

## 2013-08-02 NOTE — Progress Notes (Signed)
Pre visit review using our clinic review tool, if applicable. No additional management support is needed unless otherwise documented below in the visit note. 

## 2013-08-02 NOTE — Assessment & Plan Note (Signed)
Stable and improved 

## 2013-08-02 NOTE — Patient Instructions (Signed)
Please complete lab work prior to leaving.  Follow up in 6 months.  

## 2013-08-02 NOTE — Assessment & Plan Note (Signed)
Normal A1C

## 2013-08-02 NOTE — Assessment & Plan Note (Addendum)
BP Readings from Last 3 Encounters:  08/02/13 130/86  02/26/13 118/84  01/01/13 148/100   Pt was given samples of tribenzor and a pt assistance form was filled today. Pt will attach tax info and mail in.

## 2013-08-02 NOTE — Assessment & Plan Note (Signed)
Stable CBC  Monitor

## 2013-08-03 ENCOUNTER — Encounter: Payer: Self-pay | Admitting: Family

## 2013-08-25 ENCOUNTER — Telehealth: Payer: Self-pay | Admitting: *Deleted

## 2013-08-25 MED ORDER — OLMESARTAN-AMLODIPINE-HCTZ 20-5-12.5 MG PO TABS: ORAL_TABLET | ORAL | Status: DC

## 2013-08-25 NOTE — Telephone Encounter (Signed)
Received samples of tribenzor (90 day supply) from [facility]. Notified pt and she will pick up samples tomorrow. Samples left at front desk.

## 2013-10-31 IMAGING — CR DG CERVICAL SPINE COMPLETE 4+V
6 series · 6 of 6 positions shown · non-contrast
Comparison: None.

CLINICAL DATA: Left lateral neck pain radiates to shoulder.

CERVICAL SPINE - COMPLETE 4+ VIEW

[w c-spine lat *]
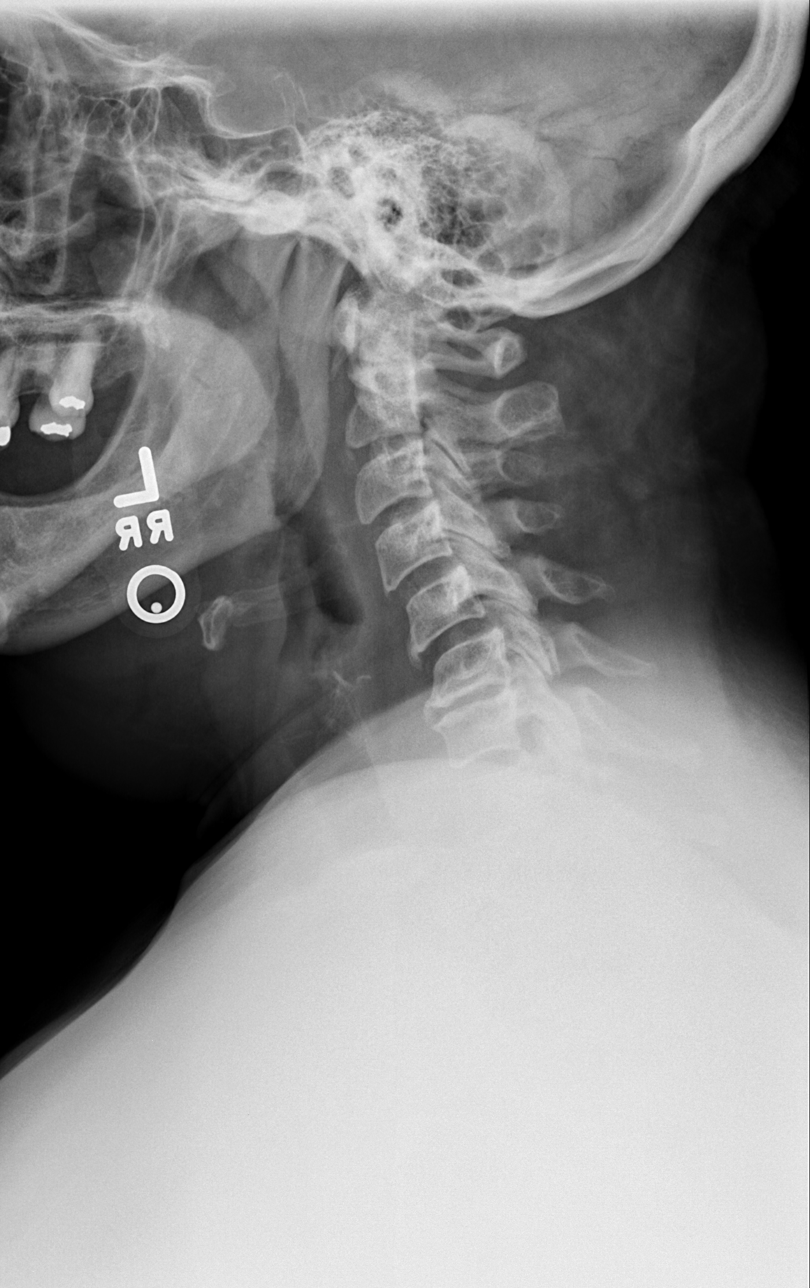

[w c-spine oblique * (1 of 2)]
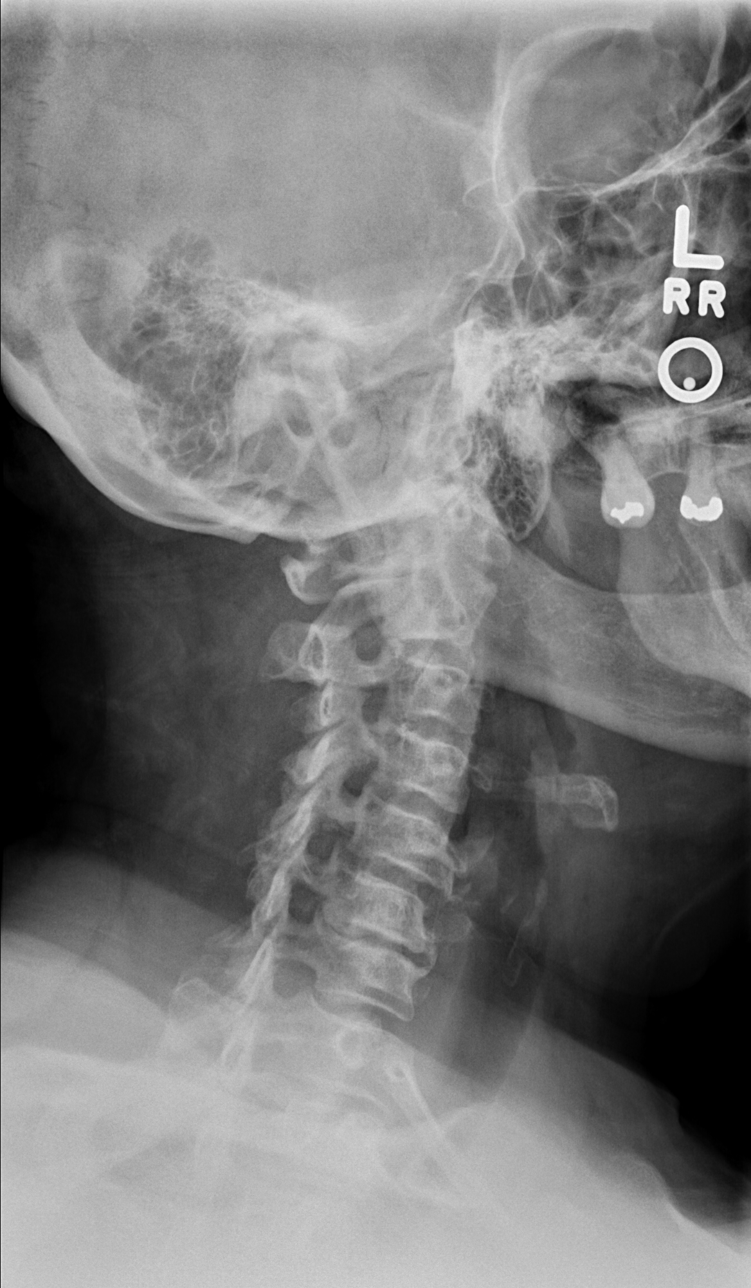

[w c-spine oblique * (2 of 2)]
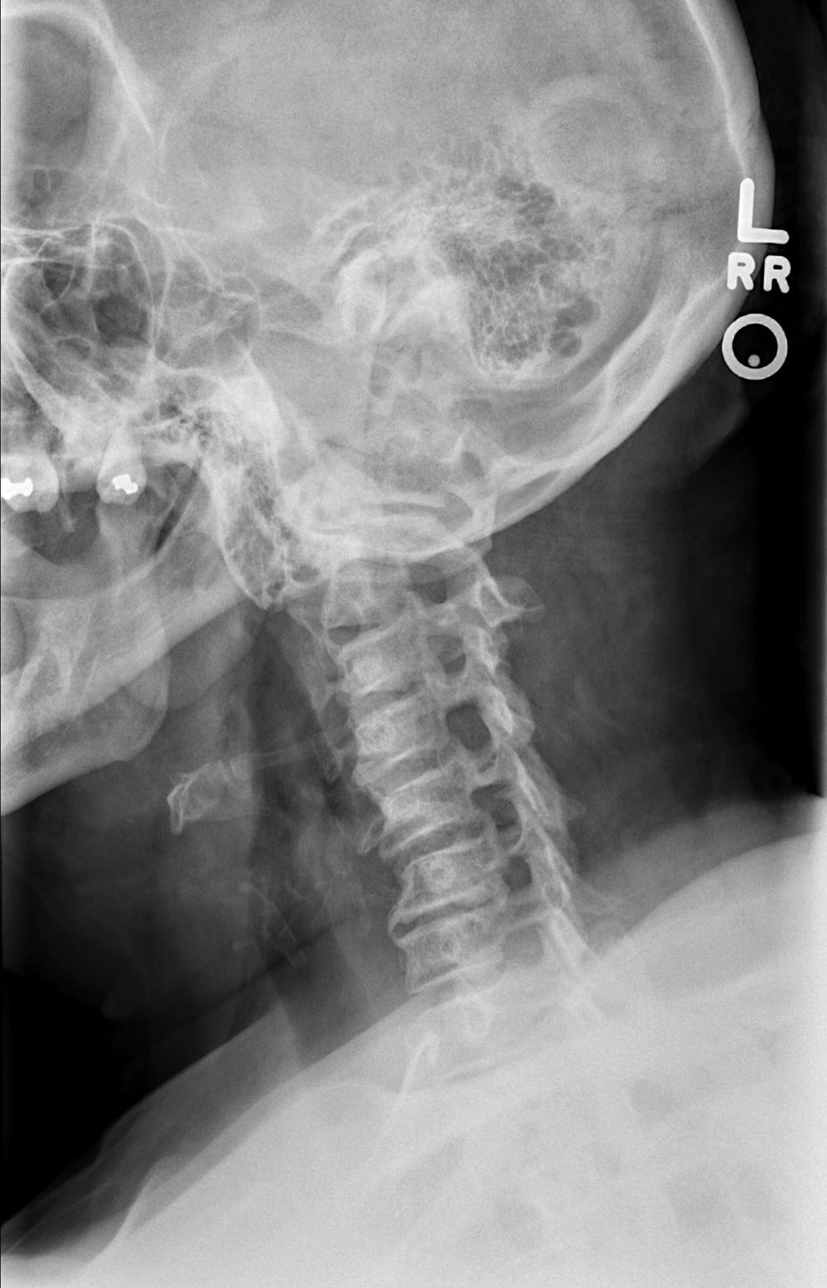

[w c-spine a.p.]
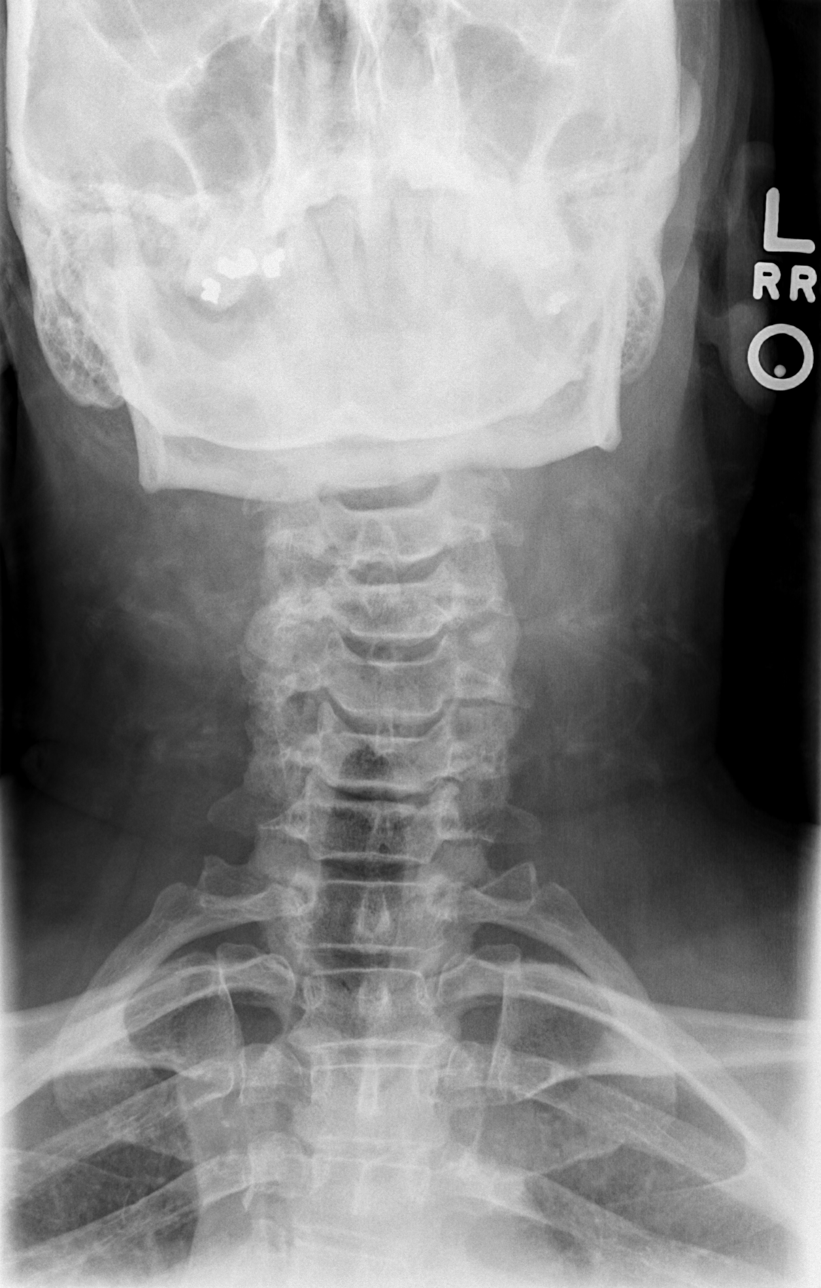

[w c-spine odontoid (1 of 2)]
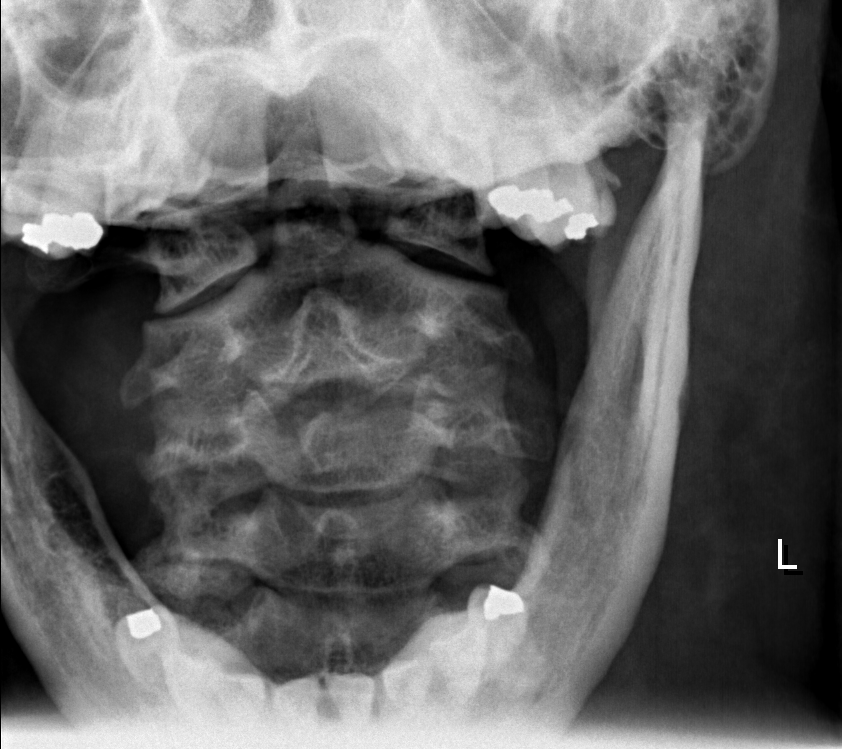

[w c-spine odontoid (2 of 2)]
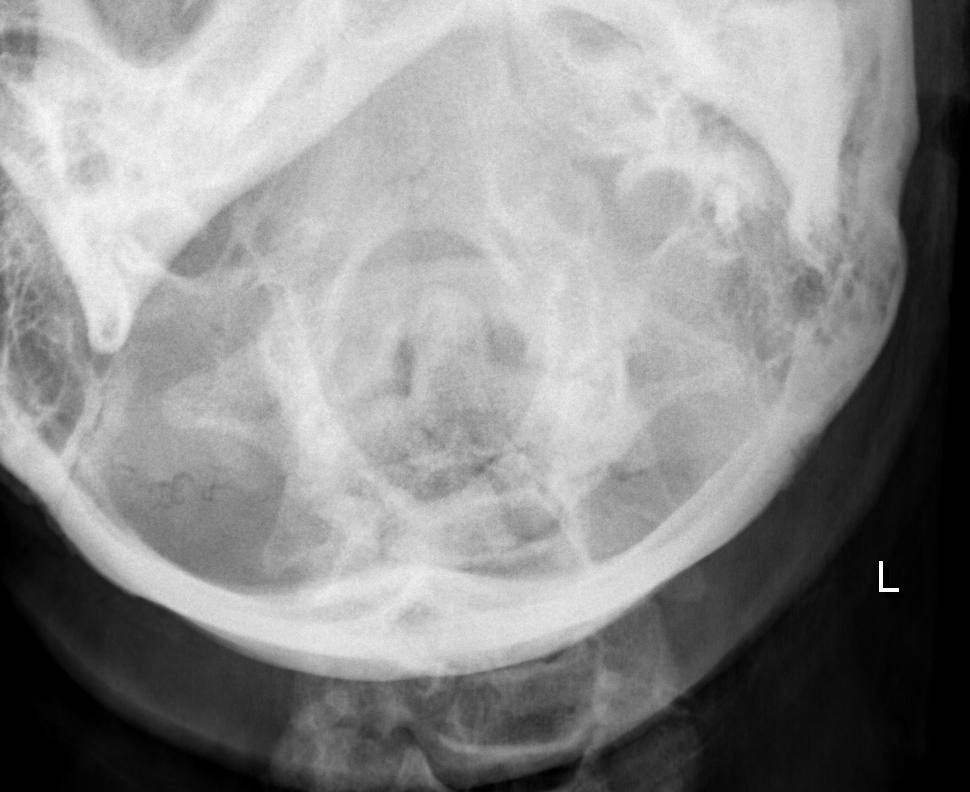

[6 of 6 positions shown; findings below may reference images not displayed]

FINDINGS: No evidence for fracture.  Trace anterolisthesis of C5
[DATE] as likely related to the associated facet disease.  There is
loss of disc height at C6-7.  Facets are well-aligned bilaterally.
Facet overgrowth narrows the superior neuro for ring and at C4-5 on
the right.  No evidence for significant bony encroachment within
the left neural foramina.  Straightening of the normal cervical
lordosis is evident.  There is no prevertebral soft tissue
swelling.
IMPRESSION: No acute bony findings.  There is some degenerative facet and disc
disease in the mid to lower cervical spine with bony foraminal
encroachment on the right at C4-5.

## 2014-01-28 ENCOUNTER — Ambulatory Visit (INDEPENDENT_AMBULATORY_CARE_PROVIDER_SITE_OTHER): Payer: Self-pay | Admitting: Family

## 2014-01-28 ENCOUNTER — Encounter: Payer: Self-pay | Admitting: Family

## 2014-01-28 ENCOUNTER — Ambulatory Visit (INDEPENDENT_AMBULATORY_CARE_PROVIDER_SITE_OTHER): Payer: Self-pay | Admitting: *Deleted

## 2014-01-28 VITALS — BP 134/84 | HR 65 | Temp 97.9°F | Resp 16 | Ht 62.0 in | Wt 256.6 lb

## 2014-01-28 DIAGNOSIS — D649 Anemia, unspecified: Secondary | ICD-10-CM

## 2014-01-28 DIAGNOSIS — I10 Essential (primary) hypertension: Secondary | ICD-10-CM

## 2014-01-28 DIAGNOSIS — Z23 Encounter for immunization: Secondary | ICD-10-CM

## 2014-01-28 LAB — BASIC METABOLIC PANEL
BUN: 13 mg/dL (ref 6–23)
CHLORIDE: 103 meq/L (ref 96–112)
CO2: 26 mEq/L (ref 19–32)
Calcium: 8.9 mg/dL (ref 8.4–10.5)
Creatinine, Ser: 0.8 mg/dL (ref 0.4–1.2)
GFR: 97.13 mL/min (ref >60.00)
Glucose, Bld: 96 mg/dL (ref 70–99)
POTASSIUM: 3.8 meq/L (ref 3.5–5.1)
Sodium: 137 mEq/L (ref 135–145)

## 2014-01-28 LAB — CBC WITH DIFFERENTIAL/PLATELET
Basophils Absolute: 0 10*3/uL (ref 0.0–0.1)
Basophils Relative: 0.4 % (ref 0.0–3.0)
EOS PCT: 2.9 % (ref 0.0–5.0)
Eosinophils Absolute: 0.2 10*3/uL (ref 0.0–0.7)
HEMATOCRIT: 36.7 % (ref 36.0–46.0)
Hemoglobin: 11.6 g/dL — ABNORMAL LOW (ref 12.0–15.0)
LYMPHS ABS: 2.2 10*3/uL (ref 0.7–4.0)
Lymphocytes Relative: 41.5 % (ref 12.0–46.0)
MCHC: 31.6 g/dL (ref 30.0–36.0)
MCV: 90.4 fl (ref 78.0–100.0)
MONO ABS: 0.4 10*3/uL (ref 0.1–1.0)
Monocytes Relative: 7.9 % (ref 3.0–12.0)
NEUTROS PCT: 47.3 % (ref 43.0–77.0)
Neutro Abs: 2.5 10*3/uL (ref 1.4–7.7)
Platelets: 304 10*3/uL (ref 150.0–400.0)
RBC: 4.05 Mil/uL (ref 3.87–5.11)
RDW: 13.9 % (ref 11.5–15.5)
WBC: 5.4 10*3/uL (ref 4.0–10.5)

## 2014-01-28 MED ORDER — CARVEDILOL 25 MG PO TABS: ORAL_TABLET | ORAL | Status: DC

## 2014-01-28 NOTE — Assessment & Plan Note (Signed)
Advised pt to restart mvi with min. Obtain follow up cbc

## 2014-01-28 NOTE — Progress Notes (Signed)
Pre visit review using our clinic review tool, if applicable. No additional management support is needed unless otherwise documented below in the visit note. 

## 2014-01-28 NOTE — Progress Notes (Signed)
Subjective:    Patient ID: Jennifer [name], female    DOB: 1960/07/22, 53 y.o.   MRN: [id number]  HPI  Ms. Divelbiss is a 53 yr old female who presents today to for follow up.   1) HTN- Patient is currently maintained on the following medications for blood pressure: coreg- has been out of the tribenzor for 2 weeks.   Patient reports good compliance with blood pressure medications. Patient denies chest pain, shortness of breath or swelling. Last 3 blood pressure readings in our office are as follows: BP Readings from Last 3 Encounters:  01/28/14 134/84  08/02/13 130/86  02/26/13 118/84    2) Anemia- not taking mvi with minerals Lab Results  Component Value Date   WBC 5.1 02/26/2013   HGB 12.0 02/26/2013   HCT 36.3 02/26/2013   MCV 88.8 02/26/2013   PLT 346 02/26/2013   3) "induration" on her right shin.     Review of Systems    see HPI  Past Medical History  Diagnosis Date  . Chest pain 12/2007    negative ECHO, neg stress test 06/2008  . Hypertension   . Allergy     allergic rhinitis  . Hyperglycemia     borderline. A1c 5.7  12/2007  . Condyloma acuminatum     history of  . Anemia 06/2007    C-scope 06/2007: tics, neg hemocults 07/2007  . Anxiety     History   Social History  . Marital Status: Married    Spouse Name: N/A    Number of Children: 3  . Years of Education: N/A   Occupational History  .  [employer]   Social History Main Topics  . Smoking status: Former Smoker -- 0.50 packs/day for 27 years    Types: Cigarettes    Quit date: 02/12/2004  . Smokeless tobacco: Never Used  . Alcohol Use: No  . Drug Use: No  . Sexual Activity: Not on file   Other Topics Concern  . Not on file   Social History Narrative   Regular exercise  No   Caffeine-- 1 daily    Past Surgical History  Procedure Laterality Date  . Cesarean section    . Abdominal hysterectomy      no oophorectomy  . Breast surgery  age 74    Fibroid tumor removed right  breast  . Cholecystectomy    . Carpal tunnel release      Family History  Problem Relation Age of Onset  . Diabetes Mother   . Coronary artery disease Father   . Kidney disease Father   . Diabetes Father   . Cancer Father     prostate  . Hypertension Father   . Coronary artery disease Sister   . Kidney disease Sister   . Diabetes Sister   . Kidney disease Brother   . Stroke Brother   . Cancer Paternal Aunt     breast  . Diabetes Maternal Grandmother   . Stroke Maternal Grandmother   . Diabetes Paternal Grandmother   . Stroke Paternal Grandmother   . Coronary artery disease Other     grandmother, uncle  . Kidney disease Other     grandmother  . Stroke Other     uncle  . Thyroid disease Other     aunt--hyperthyroid; aunt--hypothyroid  . Glaucoma Other     grandmother  . Hypertension Other     GM, F, sis, 3 bro  . Diabetes Other  aunts  . Cancer Cousin     cervical  . Colon cancer Neg Hx     Allergies  Allergen Reactions  . Codeine     REACTION: nausea    Current Outpatient Prescriptions on File Prior to Visit  Medication Sig Dispense Refill  . aspirin 81 MG chewable tablet Chew 1 tablet (81 mg total) by mouth daily. 30 tablet 0  . Multiple Vitamins-Minerals (WOMENS MULTIVITAMIN PLUS) TABS Take 1 tablet by mouth daily.      No current facility-administered medications on file prior to visit.    BP 134/84 mmHg  Pulse 65  Temp(Src) 97.9 F (36.6 C) (Oral)  Resp 16  Ht 5\' 2"  (1.575 m)  Wt 256 lb 9.6 oz (116.393 kg)  BMI 46.92 kg/m2  SpO2 98%    Objective:   Physical Exam  Constitutional: She is oriented to person, place, and time. She appears well-developed and well-nourished.  HENT:  Head: Normocephalic and atraumatic.  Cardiovascular: Normal rate and regular rhythm.   No murmur heard. Pulmonary/Chest: Effort normal and breath sounds normal. No respiratory distress. She has no wheezes. She has no rales. She exhibits no tenderness.    Musculoskeletal:  1+ bilateral LE edema Slight induration right shin, no erythema.   Neurological: She is alert and oriented to person, place, and time.  Skin: Skin is warm and dry.  Psychiatric: She has a normal mood and affect. Her behavior is normal. Judgment and thought content normal.          Assessment & Plan:  Reassured pt that induration of the right shin is not worrisome- could be due to some previous trauma to the tissue.

## 2014-01-28 NOTE — Patient Instructions (Signed)
Please complete lab work prior to leaving. Follow up in 6 weeks for BP recheck.

## 2014-01-28 NOTE — Assessment & Plan Note (Addendum)
BP looks ok off of tribenzor. Advised pt to continue coreg.  OK to remain off tribenzor, but I do want to bring her back in 6 weeks to repeat her BP and makes sure that is stable.  Obtain follow up bmet.

## 2014-01-29 ENCOUNTER — Encounter: Payer: Self-pay | Admitting: Family

## 2014-02-12 ENCOUNTER — Telehealth: Payer: Self-pay | Admitting: Family

## 2014-02-12 NOTE — Telephone Encounter (Signed)
Please contact pt re: unread message.

## 2014-02-14 NOTE — Telephone Encounter (Signed)
Mailed letter to pt

## 2014-03-11 ENCOUNTER — Ambulatory Visit: Payer: Self-pay | Admitting: Family

## 2014-09-14 ENCOUNTER — Encounter: Payer: Self-pay | Admitting: Gastroenterology

## 2014-12-30 ENCOUNTER — Other Ambulatory Visit: Payer: Self-pay | Admitting: Family

## 2014-12-30 NOTE — Telephone Encounter (Signed)
Pt walked in to the office wanting refill of Coreg today as she has been out for a couple of days. 2 week supply sent to pharmacy and advised front desk to schedule pt f/u with PCP before further refill is needed.

## 2015-01-13 ENCOUNTER — Ambulatory Visit (INDEPENDENT_AMBULATORY_CARE_PROVIDER_SITE_OTHER): Payer: Managed Care, Other (non HMO) | Admitting: Family

## 2015-01-13 ENCOUNTER — Encounter: Payer: Self-pay | Admitting: Family

## 2015-01-13 VITALS — BP 140/100 | HR 65 | Temp 98.1°F | Resp 16 | Ht 62.0 in | Wt 242.6 lb

## 2015-01-13 DIAGNOSIS — Z1159 Encounter for screening for other viral diseases: Secondary | ICD-10-CM

## 2015-01-13 DIAGNOSIS — Z0000 Encounter for general adult medical examination without abnormal findings: Secondary | ICD-10-CM

## 2015-01-13 DIAGNOSIS — D649 Anemia, unspecified: Secondary | ICD-10-CM | POA: Diagnosis not present

## 2015-01-13 DIAGNOSIS — Z23 Encounter for immunization: Secondary | ICD-10-CM | POA: Diagnosis not present

## 2015-01-13 DIAGNOSIS — I10 Essential (primary) hypertension: Secondary | ICD-10-CM

## 2015-01-13 LAB — BASIC METABOLIC PANEL
BUN: 13 mg/dL (ref 6–23)
CO2: 29 meq/L (ref 19–32)
Calcium: 9.4 mg/dL (ref 8.4–10.5)
Chloride: 104 mEq/L (ref 96–112)
Creatinine, Ser: 0.72 mg/dL (ref 0.40–1.20)
GFR: 107.73 mL/min (ref >60.00)
GLUCOSE: 91 mg/dL (ref 70–99)
POTASSIUM: 3.7 meq/L (ref 3.5–5.1)
SODIUM: 141 meq/L (ref 135–145)

## 2015-01-13 LAB — CBC WITH DIFFERENTIAL/PLATELET
BASOS ABS: 0 10*3/uL (ref 0.0–0.1)
Basophils Relative: 0.4 % (ref 0.0–3.0)
EOS PCT: 1.7 % (ref 0.0–5.0)
Eosinophils Absolute: 0.1 10*3/uL (ref 0.0–0.7)
HEMATOCRIT: 37.5 % (ref 36.0–46.0)
Hemoglobin: 12.1 g/dL (ref 12.0–15.0)
LYMPHS ABS: 2.1 10*3/uL (ref 0.7–4.0)
LYMPHS PCT: 43.3 % (ref 12.0–46.0)
MCHC: 32.2 g/dL (ref 30.0–36.0)
MCV: 89.8 fl (ref 78.0–100.0)
MONOS PCT: 8.1 % (ref 3.0–12.0)
Monocytes Absolute: 0.4 10*3/uL (ref 0.1–1.0)
NEUTROS PCT: 46.5 % (ref 43.0–77.0)
Neutro Abs: 2.3 10*3/uL (ref 1.4–7.7)
Platelets: 304 10*3/uL (ref 150.0–400.0)
RBC: 4.18 Mil/uL (ref 3.87–5.11)
RDW: 14 % (ref 11.5–15.5)
WBC: 4.9 10*3/uL (ref 4.0–10.5)

## 2015-01-13 LAB — HEPATITIS C ANTIBODY: HCV AB: NEGATIVE

## 2015-01-13 MED ORDER — CARVEDILOL 25 MG PO TABS: ORAL_TABLET | ORAL | Status: DC

## 2015-01-13 MED ORDER — AMLODIPINE BESYLATE 5 MG PO TABS: 5.0000 mg | ORAL_TABLET | Freq: Every day | ORAL | Status: DC

## 2015-01-13 NOTE — Assessment & Plan Note (Signed)
Clinically stable obtain cbc.

## 2015-01-13 NOTE — Patient Instructions (Addendum)
Complete lab work prior to leaving. Start amlodipine for your blood pressure.

## 2015-01-13 NOTE — Progress Notes (Signed)
Subjective:    Patient ID: Jennifer [name], female    DOB: Jul 08, 1960, 54 y.o.   MRN: [id number]  HPI  Jennifer [name] is a 54 yr old female who presents today for follow up.  1) HTN-  Currently maintained on coreg.  Last visit tribenzor Denies CP/SOB, notes mild LE edema  BP Readings from Last 3 Encounters:  01/13/15 140/100  01/28/14 134/84  08/02/13 130/86   2) anemia-  Maintained on MVI once daily.  Lab Results  Component Value Date   WBC 5.4 01/28/2014   HGB 11.6* 01/28/2014   HCT 36.7 01/28/2014   MCV 90.4 01/28/2014   PLT 304.0 01/28/2014     Review of Systems See HPI  Past Medical History  Diagnosis Date  . Chest pain 12/2007    negative ECHO, neg stress test 06/2008  . Hypertension   . Allergy     allergic rhinitis  . Hyperglycemia     borderline. A1c 5.7  12/2007  . Condyloma acuminatum     history of  . Anemia 06/2007    C-scope 06/2007: tics, neg hemocults 07/2007  . Anxiety     Social History   Social History  . Marital Status: Married    Spouse Name: N/A  . Number of Children: 3  . Years of Education: N/A   Occupational History  .  [employer]   Social History Main Topics  . Smoking status: Former Smoker -- 0.50 packs/day for 27 years    Types: Cigarettes    Quit date: 02/12/2004  . Smokeless tobacco: Never Used  . Alcohol Use: No  . Drug Use: No  . Sexual Activity: Not on file   Other Topics Concern  . Not on file   Social History Narrative   Regular exercise  No   Caffeine-- 1 daily    Past Surgical History  Procedure Laterality Date  . Cesarean section    . Abdominal hysterectomy      no oophorectomy  . Breast surgery  age 45    Fibroid tumor removed right breast  . Cholecystectomy    . Carpal tunnel release      Family History  Problem Relation Age of Onset  . Diabetes Mother   . Coronary artery disease Father   . Kidney disease Father   . Diabetes Father   . Cancer Father     prostate  . Hypertension  Father   . Coronary artery disease Sister   . Kidney disease Sister   . Diabetes Sister   . Kidney disease Brother   . Stroke Brother   . Cancer Paternal Aunt     breast  . Diabetes Maternal Grandmother   . Stroke Maternal Grandmother   . Diabetes Paternal Grandmother   . Stroke Paternal Grandmother   . Coronary artery disease Other     grandmother, uncle  . Kidney disease Other     grandmother  . Stroke Other     uncle  . Thyroid disease Other     aunt--hyperthyroid; aunt--hypothyroid  . Glaucoma Other     grandmother  . Hypertension Other     GM, F, sis, 3 bro  . Diabetes Other     aunts  . Cancer Cousin     cervical  . Colon cancer Neg Hx     Allergies  Allergen Reactions  . Codeine     REACTION: nausea    Current Outpatient Prescriptions on File Prior to Visit  Medication  Sig Dispense Refill  . carvedilol (COREG) 25 MG tablet TAKE 1 TABLET (25 MG) BY MOUTH 2 TIMES A DAY WITH A MEAL. 28 tablet 0  . Multiple Vitamins-Minerals (WOMENS MULTIVITAMIN PLUS) TABS Take 1 tablet by mouth daily.     Marland Kitchen aspirin 81 MG chewable tablet Chew 1 tablet (81 mg total) by mouth daily. (Patient not taking: Reported on 01/13/2015) 30 tablet 0   No current facility-administered medications on file prior to visit.    BP 140/100 mmHg  Pulse 65  Temp(Src) 98.1 F (36.7 C) (Oral)  Resp 16  Ht 5\' 2"  (1.575 m)  Wt 242 lb 9.6 oz (110.043 kg)  BMI 44.36 kg/m2  SpO2 99%       Objective:   Physical Exam  Constitutional: She appears well-developed and well-nourished.  Cardiovascular: Normal rate, regular rhythm and normal heart sounds.   No murmur heard. Pulmonary/Chest: Effort normal and breath sounds normal. No respiratory distress. She has no wheezes.  Musculoskeletal:  1+ bilateral LE edema  Psychiatric: She has a normal mood and affect. Her behavior is normal. Judgment and thought content normal.          Assessment & Plan:  Flu shot today

## 2015-01-13 NOTE — Progress Notes (Signed)
Pre visit review using our clinic review tool, if applicable. No additional management support is needed unless otherwise documented below in the visit note. 

## 2015-01-13 NOTE — Assessment & Plan Note (Signed)
BP Readings from Last 3 Encounters:  01/13/15 140/100  01/28/14 134/84  08/02/13 130/86   Uncontrolled, continue coreg, add in amlodipine. Obtain follow up bmet. Discussed diet/exercise.

## 2015-01-15 ENCOUNTER — Encounter: Payer: Self-pay | Admitting: Family

## 2015-01-26 ENCOUNTER — Ambulatory Visit (HOSPITAL_BASED_OUTPATIENT_CLINIC_OR_DEPARTMENT_OTHER)
Admission: RE | Admit: 2015-01-26 | Discharge: 2015-01-26 | Disposition: A | Discharge status: Home / Self Care | Payer: Managed Care, Other (non HMO) | Source: Ambulatory Visit | Attending: Family | Admitting: Family

## 2015-01-26 DIAGNOSIS — Z0000 Encounter for general adult medical examination without abnormal findings: Secondary | ICD-10-CM | POA: Insufficient documentation

## 2015-01-26 DIAGNOSIS — Z1231 Encounter for screening mammogram for malignant neoplasm of breast: Secondary | ICD-10-CM | POA: Insufficient documentation

## 2015-01-27 ENCOUNTER — Ambulatory Visit (INDEPENDENT_AMBULATORY_CARE_PROVIDER_SITE_OTHER): Payer: Managed Care, Other (non HMO) | Admitting: Family

## 2015-01-27 VITALS — BP 133/84 | HR 67

## 2015-01-27 DIAGNOSIS — I10 Essential (primary) hypertension: Secondary | ICD-10-CM | POA: Diagnosis not present

## 2015-01-27 NOTE — Patient Instructions (Signed)
Continue current BP medications as directed and return for OV in 3 months.

## 2015-01-27 NOTE — Progress Notes (Signed)
reviewed

## 2015-01-27 NOTE — Progress Notes (Signed)
Pre visit review using our clinic review tool, if applicable. No additional management support is needed unless otherwise documented below in the visit note.  Patient in for BP check. Per M.O'sullivan patient to continue current dose and return in 3 months.

## 2015-03-03 MED FILL — CARVEDILOL 25 MG TABLET: 25 | 30 days supply | Qty: 60 | Fill #1

## 2015-03-09 ENCOUNTER — Telehealth: Payer: Self-pay | Admitting: General Practice

## 2015-03-09 ENCOUNTER — Encounter: Payer: Self-pay | Admitting: General Practice

## 2015-03-09 NOTE — Telephone Encounter (Signed)
Spoke with pt and updated chart for pre-visit phone call. During call pt advised that she has been experiencing the sensation that her heart is racing, states this has been off and on since she began amlodipine. No pain associated with this, pt denied any SOB, dizziness, lightheadedness, or nausea.

## 2015-03-09 NOTE — Telephone Encounter (Signed)
Called pt to complete pre-visit information. LMVOM to return call

## 2015-03-10 ENCOUNTER — Ambulatory Visit (INDEPENDENT_AMBULATORY_CARE_PROVIDER_SITE_OTHER): Payer: Managed Care, Other (non HMO) | Admitting: Family

## 2015-03-10 ENCOUNTER — Ambulatory Visit (HOSPITAL_BASED_OUTPATIENT_CLINIC_OR_DEPARTMENT_OTHER)
Admission: RE | Admit: 2015-03-10 | Discharge: 2015-03-10 | Disposition: A | Discharge status: Home / Self Care | Payer: Managed Care, Other (non HMO) | Source: Ambulatory Visit | Attending: Family | Admitting: Family

## 2015-03-10 ENCOUNTER — Encounter: Payer: Self-pay | Admitting: Family

## 2015-03-10 VITALS — BP 132/87 | HR 61 | Temp 98.1°F | Resp 18 | Ht 62.0 in | Wt 249.0 lb

## 2015-03-10 DIAGNOSIS — Z780 Asymptomatic menopausal state: Secondary | ICD-10-CM | POA: Diagnosis not present

## 2015-03-10 DIAGNOSIS — Z87891 Personal history of nicotine dependence: Secondary | ICD-10-CM | POA: Insufficient documentation

## 2015-03-10 DIAGNOSIS — Z90710 Acquired absence of both cervix and uterus: Secondary | ICD-10-CM | POA: Diagnosis not present

## 2015-03-10 DIAGNOSIS — E2839 Other primary ovarian failure: Secondary | ICD-10-CM | POA: Insufficient documentation

## 2015-03-10 DIAGNOSIS — Z0001 Encounter for general adult medical examination with abnormal findings: Secondary | ICD-10-CM | POA: Diagnosis not present

## 2015-03-10 DIAGNOSIS — R0789 Other chest pain: Secondary | ICD-10-CM

## 2015-03-10 DIAGNOSIS — Z0000 Encounter for general adult medical examination without abnormal findings: Secondary | ICD-10-CM | POA: Diagnosis not present

## 2015-03-10 DIAGNOSIS — Z13820 Encounter for screening for osteoporosis: Secondary | ICD-10-CM | POA: Insufficient documentation

## 2015-03-10 LAB — LIPID PANEL
CHOLESTEROL: 180 mg/dL (ref 0–200)
HDL: 51.9 mg/dL (ref >39.00)
LDL Cholesterol: 120 mg/dL — ABNORMAL HIGH (ref 0–99)
NONHDL: 128.45
Total CHOL/HDL Ratio: 3
Triglycerides: 42 mg/dL (ref 0.0–149.0)
VLDL: 8.4 mg/dL (ref 0.0–40.0)

## 2015-03-10 LAB — URINALYSIS, ROUTINE W REFLEX MICROSCOPIC
Bilirubin Urine: NEGATIVE
KETONES UR: NEGATIVE
LEUKOCYTES UA: NEGATIVE
NITRITE: NEGATIVE
Total Protein, Urine: NEGATIVE
URINE GLUCOSE: NEGATIVE
Urobilinogen, UA: 0.2 (ref 0.0–1.0)
pH: 6 (ref 5.0–8.0)

## 2015-03-10 LAB — HEPATIC FUNCTION PANEL
ALBUMIN: 4 g/dL (ref 3.5–5.2)
ALK PHOS: 70 U/L (ref 39–117)
ALT: 13 U/L (ref 0–35)
AST: 16 U/L (ref 0–37)
BILIRUBIN TOTAL: 0.6 mg/dL (ref 0.2–1.2)
Bilirubin, Direct: 0.1 mg/dL (ref 0.0–0.3)
Total Protein: 7.3 g/dL (ref 6.0–8.3)

## 2015-03-10 LAB — TSH: TSH: 1.65 u[IU]/mL (ref 0.35–4.50)

## 2015-03-10 MED ORDER — OMEPRAZOLE 40 MG PO CPDR: 40.0000 mg | DELAYED_RELEASE_CAPSULE | Freq: Every day | ORAL | Status: DC

## 2015-03-10 MED FILL — AMLODIPINE BESYLATE 5 MG TA: 5 | 30 days supply | Qty: 30 | Fill #2

## 2015-03-10 MED FILL — OMEPRAZOLE DR 40 MG CAPSULE: 40 | 30 days supply | Qty: 30 | Fill #0

## 2015-03-10 NOTE — Patient Instructions (Signed)
Start omeprazole (antacid medication) You will be contacted about your stress test. Go to ER if severe/worsening chest pain.

## 2015-03-10 NOTE — Assessment & Plan Note (Signed)
Continue healthy diet, exercise, weight loss efforts, colo, immunizations up to date. Send for dexa.  Obtain routine lab work.

## 2015-03-10 NOTE — Progress Notes (Signed)
Pre visit review using our clinic review tool, if applicable. No additional management support is needed unless otherwise documented below in the visit note. 

## 2015-03-10 NOTE — Progress Notes (Signed)
Subjective:    Patient ID: Jennifer [name], female    DOB: February 17, 1960, 55 y.o.   MRN: [id number]  HPI  Patient presents today for complete physical.  Immunizations: tetanus 2008,, flu shot up to date Diet: reports diet is fair- trying to eliminate bread from her diet Exercise: goes to the gym- 2-3 times a week Colonoscopy:2009- due 2019 Dexa: due Pap Smear: hysterectomy Mammogram: 12/16 Dental: due Vision: due  Reports intermittent daily chest pressure since starting amlodipine. Happens daily x 2 weeks.  Occurs when she is sitting.  Pain sometimes radiates to both arms.  Never occurs when she goes to the gym. She had a similar episode back in 2013 with neg stress test.  Denies GERD. She denies current chest pain.    Review of Systems  Constitutional: Negative for unexpected weight change.  HENT: Negative for rhinorrhea.   Eyes: Negative for visual disturbance.  Respiratory: Negative for cough.   Cardiovascular: Positive for leg swelling.       EDema- worsens as the day goes on  Gastrointestinal: Negative for diarrhea, constipation and blood in stool.  Genitourinary: Negative for dysuria, frequency and hematuria.  Musculoskeletal:       Mild shoulder pain  Skin:       Skin tags  Neurological: Negative for headaches.  Hematological: Negative for adenopathy.  Psychiatric/Behavioral:       Denies depression/anxiety       Past Medical History  Diagnosis Date  . Chest pain 12/2007    negative ECHO, neg stress test 06/2008  . Hypertension   . Allergy     allergic rhinitis  . Hyperglycemia     borderline. A1c 5.7  12/2007  . Condyloma acuminatum     history of  . Anemia 06/2007    C-scope 06/2007: tics, neg hemocults 07/2007  . Anxiety     Social History   Social History  . Marital Status: Married    Spouse Name: N/A  . Number of Children: 3  . Years of Education: N/A   Occupational History  .  [employer]   Social History Main Topics  . Smoking  status: Former Smoker -- 0.50 packs/day for 27 years    Types: Cigarettes    Quit date: 02/12/2004  . Smokeless tobacco: Never Used  . Alcohol Use: No  . Drug Use: No  . Sexual Activity: Not on file   Other Topics Concern  . Not on file   Social History Narrative   Regular exercise  No   Caffeine-- 1 daily    Past Surgical History  Procedure Laterality Date  . Cesarean section    . Abdominal hysterectomy      no oophorectomy  . Breast surgery  age 22    Fibroid tumor removed right breast  . Cholecystectomy    . Carpal tunnel release      Family History  Problem Relation Age of Onset  . Diabetes Mother   . Coronary artery disease Father   . Kidney disease Father   . Diabetes Father   . Cancer Father     prostate  . Hypertension Father   . Coronary artery disease Sister   . Kidney disease Sister   . Diabetes Sister   . Kidney disease Brother   . Stroke Brother   . Cancer Paternal Aunt     breast  . Diabetes Maternal Grandmother   . Stroke Maternal Grandmother   . Diabetes Paternal Grandmother   . Stroke  Paternal Grandmother   . Coronary artery disease Other     grandmother, uncle  . Kidney disease Other     grandmother  . Stroke Other     uncle  . Thyroid disease Other     aunt--hyperthyroid; aunt--hypothyroid  . Glaucoma Other     grandmother  . Hypertension Other     GM, F, sis, 3 bro  . Diabetes Other     aunts  . Cancer Cousin     cervical  . Colon cancer Neg Hx     Allergies  Allergen Reactions  . Codeine     REACTION: nausea    Current Outpatient Prescriptions on File Prior to Visit  Medication Sig Dispense Refill  . amLODipine (NORVASC) 5 MG tablet Take 1 tablet (5 mg total) by mouth daily. 30 tablet 2  . aspirin 81 MG chewable tablet Chew 1 tablet (81 mg total) by mouth daily. 30 tablet 0  . carvedilol (COREG) 25 MG tablet TAKE 1 TABLET (25 MG) BY MOUTH 2 TIMES A DAY WITH A MEAL. 60 tablet 5  . Multiple Vitamins-Minerals (WOMENS  MULTIVITAMIN PLUS) TABS Take 1 tablet by mouth daily.      No current facility-administered medications on file prior to visit.    BP 132/87 mmHg  Pulse 61  Temp(Src) 98.1 F (36.7 C) (Oral)  Resp 18  Ht 5\' 2"  (1.575 m)  Wt 249 lb (112.946 kg)  BMI 45.53 kg/m2  SpO2 99%    Objective:   Physical Exam Physical Exam  Constitutional: She is oriented to person, place, and time. She appears well-developed and well-nourished. No distress.  HENT:  Head: Normocephalic and atraumatic.  Right Ear: Tympanic membrane and ear canal normal.  Left Ear: Tympanic membrane and ear canal normal.  Mouth/Throat: Oropharynx is clear and moist.  Eyes: Pupils are equal, round, and reactive to light. No scleral icterus.  Neck: Normal range of motion. No thyromegaly present.  Cardiovascular: Normal rate and regular rhythm.   No murmur heard. Pulmonary/Chest: Effort normal and breath sounds normal. No respiratory distress. He has no wheezes. She has no rales. She exhibits no tenderness.  Abdominal: Soft. Bowel sounds are normal. He exhibits no distension and no mass. There is no tenderness. There is no rebound and no guarding.  Musculoskeletal: She exhibits 1+ bilateral LE edema Lymphadenopathy:    She has no cervical adenopathy.  Neurological: She is alert and oriented to person, place, and time.  She exhibits normal muscle tone. Coordination normal.  Skin: Skin is warm and dry.  Psychiatric: She has a normal mood and affect. Her behavior is normal. Judgment and thought content normal.  Breasts: Examined lying Right: Without masses, retractions, discharge or axillary adenopathy.  Left: Without masses, retractions, discharge or axillary adenopathy.  Pelvic: deferred        Assessment & Plan:          Assessment & Plan:   Atypical CP- initiate PPI, send for stress test EKG today.  Seems non-cardiac in nature. EKG is performed today and compared to previous.  Appears unchanged. Will send  for stress test.  Pt is advised to go to the ER if severe/worsening CP.

## 2015-03-15 NOTE — Addendum Note (Signed)
Addended by: Debbrah Alar on: 03/15/2015 11:28 AM   Modules accepted: Orders, SmartSet

## 2015-04-07 ENCOUNTER — Ambulatory Visit (INDEPENDENT_AMBULATORY_CARE_PROVIDER_SITE_OTHER): Payer: Managed Care, Other (non HMO) | Admitting: Family

## 2015-04-07 ENCOUNTER — Encounter: Payer: Self-pay | Admitting: Family

## 2015-04-07 VITALS — BP 116/62 | HR 78 | Temp 97.9°F | Ht 62.0 in | Wt 252.4 lb

## 2015-04-07 DIAGNOSIS — R0789 Other chest pain: Secondary | ICD-10-CM | POA: Diagnosis not present

## 2015-04-07 DIAGNOSIS — K219 Gastro-esophageal reflux disease without esophagitis: Secondary | ICD-10-CM | POA: Diagnosis not present

## 2015-04-07 DIAGNOSIS — I10 Essential (primary) hypertension: Secondary | ICD-10-CM

## 2015-04-07 MED ORDER — CARVEDILOL 25 MG PO TABS: ORAL_TABLET | ORAL | Status: DC

## 2015-04-07 MED ORDER — AMLODIPINE BESYLATE 5 MG PO TABS: 5.0000 mg | ORAL_TABLET | Freq: Every day | ORAL | Status: DC

## 2015-04-07 MED FILL — CARVEDILOL 25 MG TABLET: 25 | 30 days supply | Qty: 60 | Fill #0

## 2015-04-07 MED FILL — AMLODIPINE BESYLATE 5 MG TA: 5 | 30 days supply | Qty: 30 | Fill #0

## 2015-04-07 NOTE — Progress Notes (Signed)
Pre visit review using our clinic review tool, if applicable. No additional management support is needed unless otherwise documented below in the visit note. 

## 2015-04-07 NOTE — Progress Notes (Signed)
Subjective:    Patient ID: Jennifer [name], female    DOB: 02/15/1960, 55 y.o.   MRN: [id number]  HPI  Jennifer [name] is a 55 yr old female who presents today for follow up.  1) HTN- She is currently maintained on amlodipine and coreg.   BP Readings from Last 3 Encounters:  04/07/15 116/62  03/10/15 132/87  01/27/15 133/84   2) GERD- not taking prilosec.  Denies GERD symptoms, denies dysphagia. + hoarseness.  Denies rhinorrhea,   3) Atypical CP- pt reports that she received a message from her insurance that ETT would not be covered.  Pt reports resolution of the chest pain.     Review of Systems  Respiratory: Negative for shortness of breath.   Cardiovascular: Negative for chest pain.       Very rare palpitations. Mild LE edema      see HPI  Past Medical History  Diagnosis Date  . Chest pain 12/2007    negative ECHO, neg stress test 06/2008  . Hypertension   . Allergy     allergic rhinitis  . Hyperglycemia     borderline. A1c 5.7  12/2007  . Condyloma acuminatum     history of  . Anemia 06/2007    C-scope 06/2007: tics, neg hemocults 07/2007  . Anxiety     Social History   Social History  . Marital Status: Married    Spouse Name: N/A  . Number of Children: 3  . Years of Education: N/A   Occupational History  .  [employer]   Social History Main Topics  . Smoking status: Former Smoker -- 0.50 packs/day for 27 years    Types: Cigarettes    Quit date: 02/12/2004  . Smokeless tobacco: Never Used  . Alcohol Use: No  . Drug Use: No  . Sexual Activity: Not on file   Other Topics Concern  . 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 Cesarean section    . Abdominal  hysterectomy      no oophorectomy  . Breast surgery  age 66    Fibroid tumor removed right breast  . Cholecystectomy    . Carpal tunnel release      Family History  Problem Relation Age of Onset  . Diabetes Mother   . Coronary artery disease Father   . Kidney disease Father   . Diabetes Father   . Cancer Father     prostate  . Hypertension Father   . Coronary artery disease Sister   . Kidney disease Sister   . Diabetes Sister   . Kidney disease Brother   . Stroke Brother   . Cancer Paternal Aunt     breast  . Diabetes Maternal Grandmother   . Stroke Maternal Grandmother   . Diabetes Paternal Grandmother   . Stroke Paternal Grandmother   . Coronary artery disease Other     grandmother, uncle  . Kidney disease Other     grandmother  . Stroke Other     uncle  . Thyroid disease Other     aunt--hyperthyroid; aunt--hypothyroid  . Glaucoma Other  grandmother  . Hypertension Other     GM, F, sis, 3 bro  . Diabetes Other     aunts  . Cancer Cousin     cervical  . Colon cancer Neg Hx     Allergies  Allergen Reactions  . Codeine     REACTION: nausea    Current Outpatient Prescriptions on File Prior to Visit  Medication Sig Dispense Refill  . amLODipine (NORVASC) 5 MG tablet Take 1 tablet (5 mg total) by mouth daily. 30 tablet 2  . aspirin 81 MG chewable tablet Chew 1 tablet (81 mg total) by mouth daily. 30 tablet 0  . carvedilol (COREG) 25 MG tablet TAKE 1 TABLET (25 MG) BY MOUTH 2 TIMES A DAY WITH A MEAL. 60 tablet 5  . Multiple Vitamins-Minerals (WOMENS MULTIVITAMIN PLUS) TABS Take 1 tablet by mouth daily.     Marland Kitchen omeprazole (PRILOSEC) 40 MG capsule Take 1 capsule (40 mg total) by mouth daily. (Patient not taking: Reported on 04/07/2015) 30 capsule 3   No current facility-administered medications on file prior to visit.    BP 116/62 mmHg  Pulse 78  Temp(Src) 97.9 F (36.6 C) (Oral)  Ht 5\' 2"  (1.575 m)  Wt 252 lb 6.4 oz (114.488 kg)  BMI 46.15 kg/m2  SpO2  95%    Objective:   Physical Exam  Constitutional: She appears well-developed and well-nourished.  HENT:  Head: Normocephalic and atraumatic.  Cardiovascular: Normal rate, regular rhythm and normal heart sounds.   No murmur heard. Pulmonary/Chest: Effort normal and breath sounds normal. No respiratory distress. She has no wheezes.  Musculoskeletal: She exhibits no edema.  Neurological: She is alert.  Skin: Skin is warm and dry.  Psychiatric: She has a normal mood and affect. Her behavior is normal. Judgment and thought content normal.          Assessment & Plan:  Atypical CP- resolved.  Insurance would no cover ETT per pt. Advised pt to go to the ED if recurrent CP.  We could consider referral to cardiology at that time.

## 2015-04-07 NOTE — Patient Instructions (Signed)
Please start prilosec once daily- this may help with your hoarseness.  Let me know if hoarseness worsens or if hoarseness does not improve with prilosec.

## 2015-04-07 NOTE — Assessment & Plan Note (Signed)
Currently stable on current meds. Continue same.

## 2015-04-07 NOTE — Assessment & Plan Note (Signed)
I suspect she is having silent gerd which is contributing to hoarseness. Advised pt to try omeprazole.

## 2015-05-05 MED FILL — CARVEDILOL 25 MG TABLET: 25 | 30 days supply | Qty: 60 | Fill #1

## 2015-05-05 MED FILL — AMLODIPINE BESYLATE 5 MG TA: 5 | 30 days supply | Qty: 30 | Fill #1

## 2015-06-22 MED FILL — CARVEDILOL 25 MG TABLET: 25 | 30 days supply | Qty: 60 | Fill #2

## 2015-08-11 MED FILL — AMLODIPINE BESYLATE 5 MG TA: 5 | 30 days supply | Qty: 30 | Fill #2

## 2015-08-11 MED FILL — CARVEDILOL 25 MG TABLET: 25 | 30 days supply | Qty: 60 | Fill #3

## 2015-09-08 MED FILL — AMLODIPINE BESYLATE 5 MG TA: 5 | 30 days supply | Qty: 30 | Fill #3

## 2015-09-08 MED FILL — CARVEDILOL 25 MG TABLET: 25 | 30 days supply | Qty: 60 | Fill #4

## 2015-10-06 ENCOUNTER — Ambulatory Visit: Payer: Self-pay | Admitting: Family

## 2015-10-08 ENCOUNTER — Emergency Department (HOSPITAL_COMMUNITY)
Admission: EM | Admit: 2015-10-08 | Discharge: 2015-10-08 | Disposition: A | Discharge status: Home / Self Care | Payer: Managed Care, Other (non HMO) | Attending: Emergency Medicine | Admitting: Emergency Medicine

## 2015-10-08 ENCOUNTER — Encounter (HOSPITAL_COMMUNITY): Payer: Self-pay | Admitting: Emergency Medicine

## 2015-10-08 ENCOUNTER — Emergency Department (HOSPITAL_COMMUNITY): Payer: Managed Care, Other (non HMO)

## 2015-10-08 DIAGNOSIS — I10 Essential (primary) hypertension: Secondary | ICD-10-CM | POA: Diagnosis not present

## 2015-10-08 DIAGNOSIS — R079 Chest pain, unspecified: Secondary | ICD-10-CM

## 2015-10-08 DIAGNOSIS — R002 Palpitations: Secondary | ICD-10-CM | POA: Insufficient documentation

## 2015-10-08 DIAGNOSIS — Z79899 Other long term (current) drug therapy: Secondary | ICD-10-CM | POA: Insufficient documentation

## 2015-10-08 DIAGNOSIS — Z87891 Personal history of nicotine dependence: Secondary | ICD-10-CM | POA: Diagnosis not present

## 2015-10-08 DIAGNOSIS — Z7982 Long term (current) use of aspirin: Secondary | ICD-10-CM | POA: Insufficient documentation

## 2015-10-08 LAB — CBC
HEMATOCRIT: 40 % (ref 36.0–46.0)
HEMOGLOBIN: 12.8 g/dL (ref 12.0–15.0)
MCH: 28.6 pg (ref 26.0–34.0)
MCHC: 32 g/dL (ref 30.0–36.0)
MCV: 89.5 fL (ref 78.0–100.0)
Platelets: 310 10*3/uL (ref 150–400)
RBC: 4.47 MIL/uL (ref 3.87–5.11)
RDW: 13.7 % (ref 11.5–15.5)
WBC: 5.8 10*3/uL (ref 4.0–10.5)

## 2015-10-08 LAB — I-STAT TROPONIN, ED
Troponin i, poc: 0 ng/mL (ref 0.00–0.08)
Troponin i, poc: 0 ng/mL (ref 0.00–0.08)

## 2015-10-08 LAB — BASIC METABOLIC PANEL
ANION GAP: 6 (ref 5–15)
BUN: 11 mg/dL (ref 6–20)
CALCIUM: 9.3 mg/dL (ref 8.9–10.3)
CO2: 24 mmol/L (ref 22–32)
Chloride: 107 mmol/L (ref 101–111)
Creatinine, Ser: 0.87 mg/dL (ref 0.44–1.00)
GFR calc Af Amer: >60 mL/min (ref >60)
GFR calc non Af Amer: >60 mL/min (ref >60)
GLUCOSE: 138 mg/dL — AB (ref 65–99)
POTASSIUM: 3.7 mmol/L (ref 3.5–5.1)
Sodium: 137 mmol/L (ref 135–145)

## 2015-10-08 MED ORDER — ASPIRIN 81 MG PO CHEW
324.0000 mg | CHEWABLE_TABLET | Freq: Once | ORAL | Status: AC
  Administered 2015-10-08: 324 mg via ORAL
  Filled 2015-10-08: qty 4

## 2015-10-08 NOTE — ED Triage Notes (Signed)
Pt states this morning before going to church she had a fluttering sensation in her chest and broke out into a sweat. Pt states the feeling passed and the went to church. When standing at church the feeling returned and pt broke out into a sweat and had 1 episode of vomiting. Pt is warm and dry at triage. Pt has epigastric pain at this time 1/10 pressure.

## 2015-10-08 NOTE — ED Provider Notes (Signed)
[facility] DEPT Provider Note   CSN: [id number] Arrival date & time: 10/08/15  1458   History   Chief Complaint Chief Complaint  Patient presents with  . Chest Pain   HPI   ZAYDA CASE is an 55 y.o. female with history of HTN who presents to the ED for evaluation of chest pain. She states this morning she was getting ready to go to church when she felt a fluttering sensation in the middle of her chest and a pressure. She states the pressure eased up so she went to church but when she was at church the feeling returned. She states she felt very weak, hot, and sweaty and went to the restroom and vomited. She states she rested in the bathroom a while and started to feel better. She states that since then she has had intermittent episodes of fluttering, pressure, and sweats. Denies currently. Denies shortness of breath. She did not take anything to alleviate her symptoms. Endorse fam hx of heart disease and CAD in her father.  Past Medical History:  Diagnosis Date  . Allergy    allergic rhinitis  . Anemia 06/2007   C-scope 06/2007: tics, neg hemocults 07/2007  . Anxiety   . Chest pain 12/2007   negative ECHO, neg stress test 06/2008  . Condyloma acuminatum    history of  . Hyperglycemia    borderline. A1c 5.7  12/2007  . Hypertension     Patient Active Problem List   Diagnosis Date Noted  . Esophageal reflux 10/16/2012  . Dysphagia, unspecified(787.20) 10/16/2012  . Abdominal pain, epigastric 09/11/2012  . 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 Carpal tunnel syndrome 01/03/2012  . Obesity 12/20/2011  . Neck pain 09/27/2011  . Left shoulder pain 09/27/2011  . Joint pain 06/04/2010  . General medical examination 06/04/2010  . HOT FLASHES 04/27/2010  . CHOLECYSTECTOMY, LAPAROSCOPIC, HX OF 08/09/2009  . ALLERGIC RHINITIS 04/26/2008  . Anemia 05/05/2007  . CONDYLOMA ACUMINATUM 04/15/2007  . Essential hypertension 07/03/2006    Past Surgical History:  Procedure  Laterality Date  . ABDOMINAL HYSTERECTOMY     no oophorectomy  . BREAST SURGERY  age 59   Fibroid tumor removed right breast  . CARPAL TUNNEL RELEASE    . CESAREAN SECTION    . CHOLECYSTECTOMY      OB History    No data available       Home Medications    Prior to Admission medications   Medication Sig Start Date End Date Taking? Authorizing Provider  amLODipine (NORVASC) 5 MG tablet Take 1 tablet (5 mg total) by mouth daily. 04/07/15   Debbrah Alar, NP  aspirin 81 MG chewable tablet Chew 1 tablet (81 mg total) by mouth daily. 12/17/11   Calvert Cantor, MD  carvedilol (COREG) 25 MG tablet TAKE 1 TABLET (25 MG) BY MOUTH 2 TIMES A DAY WITH A MEAL. 04/07/15   Debbrah Alar, NP  Multiple Vitamins-Minerals (WOMENS MULTIVITAMIN PLUS) TABS Take 1 tablet by mouth daily.     Historical Provider, MD  omeprazole (PRILOSEC) 40 MG capsule Take 1 capsule (40 mg total) by mouth daily. Patient not taking: Reported on 04/07/2015 03/10/15   Debbrah Alar, NP    Family History Family History  Problem Relation Age of Onset  . Cancer Paternal Aunt     breast  . Cancer Cousin     cervical  . Diabetes Mother   . Coronary artery disease Father   . Kidney disease Father   . Diabetes  Father   . Cancer Father     prostate  . Hypertension Father   . Coronary artery disease Sister   . Kidney disease Sister   . Diabetes Sister   . Kidney disease Brother   . Stroke Brother   . Diabetes Maternal Grandmother   . Stroke Maternal Grandmother   . Diabetes Paternal Grandmother   . Stroke Paternal Grandmother   . Coronary artery disease Other     grandmother, uncle  . Kidney disease Other     grandmother  . Stroke Other     uncle  . Thyroid disease Other     aunt--hyperthyroid; aunt--hypothyroid  . Glaucoma Other     grandmother  . Hypertension Other     GM, F, sis, 3 bro  . Diabetes Other     aunts  . Colon cancer Neg Hx     Social History Social History  Substance Use  Topics  . Smoking status: Former Smoker    Packs/day: 0.50    Years: 27.00    Types: Cigarettes    Quit date: 02/12/2004  . Smokeless tobacco: Never Used  . Alcohol use No     Allergies   Codeine   Review of Systems Review of Systems 10 Systems reviewed and are negative for acute change except as noted in the HPI.  Physical Exam Updated Vital Signs BP 128/98   Pulse 80   Temp 98.3 F (36.8 C) (Oral)   Resp 20   Ht 5\' 6"  (1.676 m)   Wt 114.3 kg   SpO2 97%   BMI 40.67 kg/m   Physical Exam  Constitutional: She is oriented to person, place, and time.  HENT:  Right Ear: External ear normal.  Left Ear: External ear normal.  Nose: Nose normal.  Mouth/Throat: Oropharynx is clear and moist. No oropharyngeal exudate.  Eyes: Conjunctivae are normal.  Neck: Neck supple.  Cardiovascular: Normal rate, regular rhythm, normal heart sounds and intact distal pulses.   Pulmonary/Chest: Effort normal and breath sounds normal. No respiratory distress. She has no wheezes.  Abdominal: Soft. Bowel sounds are normal. She exhibits no distension. There is no tenderness. There is no rebound and no guarding.  Musculoskeletal: She exhibits no edema.  Lymphadenopathy:    She has no cervical adenopathy.  Neurological: She is alert and oriented to person, place, and time. No cranial nerve deficit.  Skin: Skin is warm and dry.  Psychiatric: She has a normal mood and affect.  Nursing note and vitals reviewed.    ED Treatments / Results  Labs (all labs ordered are listed, but only abnormal results are displayed) Labs Reviewed  BASIC METABOLIC PANEL - Abnormal; Notable for the following:       Result Value   Glucose, Bld 138 (*)    All other components within normal limits  CBC  I-STAT TROPOININ, ED  Randolm Idol, ED    EKG  EKG Interpretation None       Radiology Dg Chest 2 View  Result Date: 10/08/2015 CLINICAL DATA:  Fluttering, emesis and diaphoresis. EXAM: CHEST  2 VIEW  COMPARISON:  Chest radiograph 12/17/2011 FINDINGS: Mild enlargement of the cardiac silhouette. Torturous aorta. Mediastinal contours appear intact. There is no evidence of focal airspace consolidation, pleural effusion or pneumothorax. Osseous structures are without acute abnormality. Soft tissues are grossly normal. IMPRESSION: Mild enlargement of the cardiac silhouette. No focal airspace consolidation or pulmonary edema. Electronically Signed   By: Fidela Salisbury M.D.   On: 10/08/2015  16:19    Procedures Procedures (including critical care time)  Medications Ordered in ED Medications  aspirin chewable tablet 324 mg (324 mg Oral Given 10/08/15 1704)     Initial Impression / Assessment and Plan / ED Course  I have reviewed the triage vital signs and the nursing notes.  Pertinent labs & imaging results that were available during my care of the patient were reviewed by me and considered in my medical decision making (see chart for details).  Clinical Course   Pt is an 55 y.o. female with history of HTN, fam hx of CAD presenting with chest pressure and "fluttering" today with associated emesis and diaphoresis. Fluttering has been intermittent but asymptomatic int he ED. She has been given ASA. Her initial workup is unrevealing. HEART score 3. She does not want to be admitted. Will repeat troponin and reassess.  7:58 PM Pt remains asymptomatic in the ED. Repeat troponin is negative. Workup unrevealing. However, with her risk factors did encourage close outpatient f/u with PCP and cardiology for a cardiac evaluation. Strict ER return precautions given.  Final Clinical Impressions(s) / ED Diagnoses   Final diagnoses:  Nonspecific chest pain  Palpitations    New Prescriptions New Prescriptions   No medications on file     Carmelina Dane 10/08/15 1959    Dorie Rank, MD 10/09/15 1459

## 2015-10-09 ENCOUNTER — Telehealth: Payer: Self-pay | Admitting: Family

## 2015-10-09 NOTE — Telephone Encounter (Signed)
Please contact patient to arrange ED follow up visit.

## 2015-10-11 NOTE — Telephone Encounter (Signed)
Called pt. Left detailed voicemail informing her that a ED follow up appt is needed with PCP. Advised pt to call our office / schedule via My Chart.    Thanks.

## 2015-10-20 ENCOUNTER — Ambulatory Visit (INDEPENDENT_AMBULATORY_CARE_PROVIDER_SITE_OTHER): Payer: Managed Care, Other (non HMO) | Admitting: Family

## 2015-10-20 ENCOUNTER — Encounter: Payer: Self-pay | Admitting: Family

## 2015-10-20 VITALS — BP 140/94 | HR 63 | Temp 98.1°F | Resp 17 | Ht 66.0 in | Wt 256.0 lb

## 2015-10-20 DIAGNOSIS — Z23 Encounter for immunization: Secondary | ICD-10-CM

## 2015-10-20 DIAGNOSIS — I10 Essential (primary) hypertension: Secondary | ICD-10-CM

## 2015-10-20 DIAGNOSIS — R739 Hyperglycemia, unspecified: Secondary | ICD-10-CM | POA: Diagnosis not present

## 2015-10-20 DIAGNOSIS — R002 Palpitations: Secondary | ICD-10-CM

## 2015-10-20 DIAGNOSIS — K219 Gastro-esophageal reflux disease without esophagitis: Secondary | ICD-10-CM | POA: Diagnosis not present

## 2015-10-20 DIAGNOSIS — D649 Anemia, unspecified: Secondary | ICD-10-CM

## 2015-10-20 LAB — HEMOGLOBIN A1C: Hgb A1c MFr Bld: 5.9 % (ref 4.6–6.5)

## 2015-10-20 LAB — TSH: TSH: 1.77 u[IU]/mL (ref 0.35–4.50)

## 2015-10-20 MED ORDER — CARVEDILOL 25 MG PO TABS: ORAL_TABLET | ORAL | 1 refills | Status: DC

## 2015-10-20 MED ORDER — AMLODIPINE BESYLATE 5 MG PO TABS: 5.0000 mg | ORAL_TABLET | Freq: Every day | ORAL | 1 refills | Status: DC

## 2015-10-20 MED FILL — CARVEDILOL 25 MG TABLET: 25 | 30 days supply | Qty: 60 | Fill #0

## 2015-10-20 MED FILL — AMLODIPINE BESYLATE 5 MG TA: 5 | 30 days supply | Qty: 30 | Fill #0

## 2015-10-20 NOTE — Progress Notes (Signed)
Pre visit review using our clinic review tool, if applicable. No additional management support is needed unless otherwise documented below in the visit note. 

## 2015-10-20 NOTE — Progress Notes (Signed)
Subjective:    Patient ID: Jennifer [name], female    DOB: Dec 11, 1960, 55 y.o.   MRN: [id number]  HPI  Jennifer [name] is a 55 yr old female who presents today for follow up.  1) Anemia-  On mvi with min.   Lab Results  Component Value Date   WBC 5.8 10/08/2015   HGB 12.8 10/08/2015   HCT 40.0 10/08/2015   MCV 89.5 10/08/2015   PLT 310 10/08/2015   2) GERD- patient is using bid prn. "every now and then."  Previously she had reported some voice hoarseness. Reports resolution of hoarseness.    3) HTN- mainained on amlodipine and coreg.  Ran out of amlodipine 2 days.  BP Readings from Last 3 Encounters:  10/20/15 (!) 148/86  10/08/15 125/93  04/07/15 116/62   4) Palpitations-  was evaluated in the ED on 10/08/15.  ED record is reviewed. Reports no further palpitations.  She reports that she has been busy. EKG noted NSR in the ED.  Hyperglycemia was noted at that time.  Sugar was 138.   Review of Systems  Respiratory: Negative for shortness of breath.   Cardiovascular: Negative for chest pain.       Reports some mild swelling   Past Medical History:  Diagnosis Date  . Allergy    allergic rhinitis  . Anemia 06/2007   C-scope 06/2007: tics, neg hemocults 07/2007  . Anxiety   . Chest pain 12/2007   negative ECHO, neg stress test 06/2008  . Condyloma acuminatum    history of  . Hyperglycemia    borderline. A1c 5.7  12/2007  . Hypertension      Social History   Social History  . Marital status: Married    Spouse name: N/A  . Number of children: 3  . Years of education: N/A   Occupational History  .  [employer]   Social History Main Topics  . Smoking status: Former Smoker    Packs/day: 0.50    Years: 27.00    Types: Cigarettes    Quit date: 02/12/2004  . Smokeless tobacco: Never Used  . Alcohol use No  . Drug use: No  . Sexual activity: Not on file   Other Topics Concern  . 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 ABDOMINAL HYSTERECTOMY     no oophorectomy  . BREAST SURGERY  age 38   Fibroid tumor removed right breast  . CARPAL TUNNEL RELEASE    . CESAREAN SECTION    . CHOLECYSTECTOMY      Family History  Problem Relation Age of Onset  . Cancer Paternal Aunt     breast  . Cancer Cousin     cervical  . Diabetes Mother   . Coronary artery disease Father   . Kidney disease Father   . Diabetes Father   . Cancer Father     prostate  . Hypertension Father   . Coronary artery disease Sister   . Kidney disease Sister   . Diabetes Sister   . Kidney disease Brother   . Stroke Brother   . Diabetes Maternal  Grandmother   . Stroke Maternal Grandmother   . Diabetes Paternal Grandmother   . Stroke Paternal Grandmother   . Coronary artery disease Other     grandmother, uncle  . Kidney disease Other     grandmother  . Stroke Other     uncle  . Thyroid disease Other     aunt--hyperthyroid; aunt--hypothyroid  . Glaucoma Other     grandmother  . Hypertension Other     GM, F, sis, 3 bro  . Diabetes Other     aunts  . Colon cancer Neg Hx     Allergies  Allergen Reactions  . Codeine Nausea And Vomiting    Current Outpatient Prescriptions on File Prior to Visit  Medication Sig Dispense Refill  . amLODipine (NORVASC) 5 MG tablet Take 1 tablet (5 mg total) by mouth daily. 90 tablet 1  . aspirin 81 MG chewable tablet Chew 1 tablet (81 mg total) by mouth daily. 30 tablet 0  . carvedilol (COREG) 25 MG tablet TAKE 1 TABLET (25 MG) BY MOUTH 2 TIMES A DAY WITH A MEAL. (Patient taking differently: Take 25 mg by mouth 2 (two) times daily with a meal. ) 180 tablet 1  . Multiple Vitamin (MULTIVITAMIN WITH MINERALS) TABS tablet Take 1 tablet by mouth at  bedtime. ALIVE multi-vitamin    . omeprazole (PRILOSEC) 40 MG capsule Take 1 capsule (40 mg total) by mouth daily. (Patient taking differently: Take 40 mg by mouth 2 (two) times daily as needed (acid reflux/ indigestion). ) 30 capsule 3   No current facility-administered medications on file prior to visit.     BP (!) 148/86 (BP Location: Left Arm, Patient Position: Sitting, Cuff Size: Large)   Pulse 63   Temp 98.1 F (36.7 C) (Oral)   Resp 17   Ht 5\' 6"  (1.676 m)   Wt 256 lb (116.1 kg)   SpO2 100%   BMI 41.32 kg/m       Objective:   Physical Exam  Constitutional: She is oriented to person, place, and time. She appears well-developed and well-nourished.  HENT:  Head: Normocephalic and atraumatic.  Cardiovascular: Normal rate, regular rhythm and normal heart sounds.   No murmur heard. Pulmonary/Chest: Effort normal and breath sounds normal. No respiratory distress. She has no wheezes.  Musculoskeletal:  Trace bilateral LE edema  Neurological: She is alert and oriented to person, place, and time.  Psychiatric: She has a normal mood and affect. Her behavior is normal. Judgment and thought content normal.          Assessment & Plan:  Palpitations- lab work, ekg unrevealing. No further sxs. Will check tsh. She is advised to let me know if recurrent symptoms.  Hyperglycemia- obtain A1C (noted on ED labs).  Flu shot today.

## 2015-10-20 NOTE — Assessment & Plan Note (Signed)
Stable on mvi with min. Continue same.

## 2015-10-20 NOTE — Assessment & Plan Note (Signed)
BP up slightly, however she has not taken amlodipine in 2 days. Advised pt to restart amlodipine, continue coreg.

## 2015-10-20 NOTE — Patient Instructions (Signed)
Restart amlodipine. Complete lab work prior to leaving.

## 2015-10-20 NOTE — Assessment & Plan Note (Signed)
Stable with use of prn omeprazole, continue same.

## 2015-10-30 ENCOUNTER — Ambulatory Visit (INDEPENDENT_AMBULATORY_CARE_PROVIDER_SITE_OTHER): Payer: Managed Care, Other (non HMO) | Admitting: Family Medicine

## 2015-10-30 ENCOUNTER — Encounter: Payer: Self-pay | Admitting: Family Medicine

## 2015-10-30 VITALS — BP 130/88 | HR 71 | Temp 97.8°F | Ht 66.0 in | Wt 259.0 lb

## 2015-10-30 DIAGNOSIS — I10 Essential (primary) hypertension: Secondary | ICD-10-CM

## 2015-10-30 MED ORDER — LISINOPRIL-HYDROCHLOROTHIAZIDE 20-12.5 MG PO TABS: 1.0000 | ORAL_TABLET | Freq: Every day | ORAL | 2 refills | Status: DC

## 2015-10-30 MED FILL — LISINOPRIL-HCTZ 20-12.5 MG: 20-12.5 | 30 days supply | Qty: 30 | Fill #0

## 2015-10-30 NOTE — Progress Notes (Signed)
Chief Complaint  Patient presents with  . Hypertension    pt states she's having trouble getting her BP down. Pt states head feels tight-she said she took a second Coreg at 12:00pm today,    Subjective Jennifer [name] is a 55 y.o. female who presents for hypertension follow up. She does monitor home blood pressures. Blood pressures 140's over 80's on average. She is compliant with medications- Coreg 25 mg BID and Norvasc 5 mg daily. Patient has these side effects of medication: none that she is aware of  She is not adhering to a low sodium and low fat diet. Current exercise: None   Past Medical History:  Diagnosis Date  . Allergy    allergic rhinitis  . Anemia 06/2007   C-scope 06/2007: tics, neg hemocults 07/2007  . Anxiety   . Chest pain 12/2007   negative ECHO, neg stress test 06/2008  . Condyloma acuminatum    history of  . Hyperglycemia    borderline. A1c 5.7  12/2007  . Hypertension    Family History  Problem Relation Age of Onset  . Cancer Paternal Aunt     breast  . Cancer Cousin     cervical  . Diabetes Mother   . Coronary artery disease Father   . Kidney disease Father   . Diabetes Father   . Cancer Father     prostate  . Hypertension Father   . Coronary artery disease Sister   . Kidney disease Sister   . Diabetes Sister   . Kidney disease Brother   . Stroke Brother   . Diabetes Maternal Grandmother   . Stroke Maternal Grandmother   . Diabetes Paternal Grandmother   . Stroke Paternal Grandmother   . Coronary artery disease Other     grandmother, uncle  . Kidney disease Other     grandmother  . Stroke Other     uncle  . Thyroid disease Other     aunt--hyperthyroid; aunt--hypothyroid  . Glaucoma Other     grandmother  . Hypertension Other     GM, F, sis, 3 bro  . Diabetes Other     aunts  . Colon cancer Neg Hx      Medications Current Outpatient Prescriptions on File Prior to Visit  Medication Sig Dispense Refill  . aspirin 81 MG chewable  tablet Chew 1 tablet (81 mg total) by mouth daily. 30 tablet 0  . carvedilol (COREG) 25 MG tablet TAKE 1 TABLET (25 MG) BY MOUTH 2 TIMES A DAY WITH A MEAL. 180 tablet 1  . Multiple Vitamin (MULTIVITAMIN WITH MINERALS) TABS tablet Take 1 tablet by mouth at bedtime. ALIVE multi-vitamin    . omeprazole (PRILOSEC) 40 MG capsule Take 1 capsule (40 mg total) by mouth daily. (Patient taking differently: Take 40 mg by mouth 2 (two) times daily as needed (acid reflux/ indigestion). ) 30 capsule 3   Allergies Allergies  Allergen Reactions  . Codeine Nausea And Vomiting    Review of Systems Eye:  no recent significant change in vision Cardiovascular:  no exercise intolerance, no chest pain, no palpitations Respiratory:  no cough or shortness of breath Neurologic:  no chronic headaches, numbness or tingling  Exam BP 130/88 (BP Location: Left Arm, Patient Position: Sitting, Cuff Size: Large)   Pulse 71   Temp 97.8 F (36.6 C) (Oral)   Ht 5\' 6"  (1.676 m)   Wt 259 lb (117.5 kg)   SpO2 96%   BMI 41.80 kg/m  General:  well developed, well nourished, in no apparent distress Skin:  warm, no pallor or diaphoresis Eyes:  pupils equal and round, sclera anicteric without injection Neck: neck supple without adenopathy, thyromegaly, masses, or bruits  Lungs:  clear to auscultation, breath sounds equal bilaterally Cardio:  regular rate and rhythm without murmurs, heart sounds without clicks or rubs, [id number] pitting edema b/l up to prox 1/3 of tibia. Psych: well oriented with normal range of affect and appropriate judgment/insight  Essential hypertension, benign - Plan: Basic Metabolic Panel (BMET), lisinopril-hydrochlorothiazide (PRINZIDE,ZESTORETIC) 20-12.5 MG tablet  Orders as above. She did not want to add another pill and she did not want to increase the Norvasc with the side effect of lower extremity swelling.. Given that she is having lower extremity edema, will stop Norvasc and start Prinzide. F/u in  1 week- Bring blood pressure log, we'll recheck electrolytes. The patient voiced understanding and agreement to the plan.  [place], DO 10/30/15  3:24 PM

## 2015-10-30 NOTE — Patient Instructions (Signed)
Keep a blood pressure log over the next week. Bring your BP cuff to your appt as well.  You do not need to be fasting next week.

## 2015-10-30 NOTE — Progress Notes (Signed)
Pre visit review using our clinic review tool, if applicable. No additional management support is needed unless otherwise documented below in the visit note. 

## 2015-10-31 LAB — BASIC METABOLIC PANEL
BUN: 15 mg/dL (ref 6–23)
CHLORIDE: 106 meq/L (ref 96–112)
CO2: 32 meq/L (ref 19–32)
Calcium: 8.8 mg/dL (ref 8.4–10.5)
Creatinine, Ser: 0.9 mg/dL (ref 0.40–1.20)
GFR: 83.03 mL/min (ref >60.00)
GLUCOSE: 111 mg/dL — AB (ref 70–99)
POTASSIUM: 3.8 meq/L (ref 3.5–5.1)
Sodium: 142 mEq/L (ref 135–145)

## 2015-11-06 ENCOUNTER — Other Ambulatory Visit: Payer: Self-pay | Admitting: Family Medicine

## 2015-11-06 ENCOUNTER — Ambulatory Visit (INDEPENDENT_AMBULATORY_CARE_PROVIDER_SITE_OTHER): Payer: Managed Care, Other (non HMO) | Admitting: Family Medicine

## 2015-11-06 ENCOUNTER — Encounter: Payer: Self-pay | Admitting: Family Medicine

## 2015-11-06 VITALS — BP 114/78 | HR 68 | Temp 98.1°F | Ht 66.0 in | Wt 254.2 lb

## 2015-11-06 DIAGNOSIS — I10 Essential (primary) hypertension: Secondary | ICD-10-CM | POA: Diagnosis not present

## 2015-11-06 DIAGNOSIS — M25572 Pain in left ankle and joints of left foot: Secondary | ICD-10-CM | POA: Diagnosis not present

## 2015-11-06 DIAGNOSIS — M25571 Pain in right ankle and joints of right foot: Secondary | ICD-10-CM

## 2015-11-06 DIAGNOSIS — E876 Hypokalemia: Secondary | ICD-10-CM

## 2015-11-06 LAB — BASIC METABOLIC PANEL
BUN: 15 mg/dL (ref 6–23)
CHLORIDE: 104 meq/L (ref 96–112)
CO2: 29 meq/L (ref 19–32)
CREATININE: 0.8 mg/dL (ref 0.40–1.20)
Calcium: 9 mg/dL (ref 8.4–10.5)
GFR: 95.12 mL/min (ref >60.00)
GLUCOSE: 108 mg/dL — AB (ref 70–99)
Potassium: 3.4 mEq/L — ABNORMAL LOW (ref 3.5–5.1)
Sodium: 141 mEq/L (ref 135–145)

## 2015-11-06 MED ORDER — POTASSIUM CHLORIDE ER 10 MEQ PO TBCR: 10.0000 meq | EXTENDED_RELEASE_TABLET | Freq: Two times a day (BID) | ORAL | 0 refills | Status: DC

## 2015-11-06 MED FILL — POTASSIUM CL 10 MEQ TAB SA: 10 | 5 days supply | Qty: 10 | Fill #0

## 2015-11-06 NOTE — Progress Notes (Signed)
Pre visit review using our clinic review tool, if applicable. No additional management support is needed unless otherwise documented below in the visit note. 

## 2015-11-06 NOTE — Progress Notes (Signed)
Chief Complaint  Patient presents with  . Follow-up    on BP-pt states she had alot of pain in (B) ankles on yest    Subjective Jennifer [name] is a 55 y.o. female who presents for hypertension follow up. She does monitor home blood pressures. Blood pressures ranging from 140's over 80's on average. She is compliant with medications- Prinzide 25-12.5 mg daily, started 1 week ago. Patient has these side effects of medication: none She is sometimes adhering to a low sodium and low fat diet. Current exercise: None  Ankle pain- Started yesterday, both sides, after being on her feet. No swelling, injury, did get new shoes a couple weeks ago. No numbness or tingling.   Past Medical History:  Diagnosis Date  . Allergy    allergic rhinitis  . Anemia 06/2007   C-scope 06/2007: tics, neg hemocults 07/2007  . Anxiety   . Chest pain 12/2007   negative ECHO, neg stress test 06/2008  . Condyloma acuminatum    history of  . Hyperglycemia    borderline. A1c 5.7  12/2007  . Hypertension    Family History  Problem Relation Age of Onset  . Cancer Paternal Aunt     breast  . Cancer Cousin     cervical  . Diabetes Mother   . Coronary artery disease Father   . Kidney disease Father   . Diabetes Father   . Cancer Father     prostate  . Hypertension Father   . Coronary artery disease Sister   . Kidney disease Sister   . Diabetes Sister   . Kidney disease Brother   . Stroke Brother   . Diabetes Maternal Grandmother   . Stroke Maternal Grandmother   . Diabetes Paternal Grandmother   . Stroke Paternal Grandmother   . Coronary artery disease Other     grandmother, uncle  . Kidney disease Other     grandmother  . Stroke Other     uncle  . Thyroid disease Other     aunt--hyperthyroid; aunt--hypothyroid  . Glaucoma Other     grandmother  . Hypertension Other     GM, F, sis, 3 bro  . Diabetes Other     aunts  . Colon cancer Neg Hx      Medications Current Outpatient  Prescriptions on File Prior to Visit  Medication Sig Dispense Refill  . aspirin 81 MG chewable tablet Chew 1 tablet (81 mg total) by mouth daily. 30 tablet 0  . carvedilol (COREG) 25 MG tablet TAKE 1 TABLET (25 MG) BY MOUTH 2 TIMES A DAY WITH A MEAL. 180 tablet 1  . lisinopril-hydrochlorothiazide (PRINZIDE,ZESTORETIC) 20-12.5 MG tablet Take 1 tablet by mouth daily. 30 tablet 2  . Multiple Vitamin (MULTIVITAMIN WITH MINERALS) TABS tablet Take 1 tablet by mouth at bedtime. ALIVE multi-vitamin    . omeprazole (PRILOSEC) 40 MG capsule Take 1 capsule (40 mg total) by mouth daily. (Patient taking differently: Take 40 mg by mouth 2 (two) times daily as needed (acid reflux/ indigestion). ) 30 capsule 3   Allergies Allergies  Allergen Reactions  . Codeine Nausea And Vomiting    Review of Systems Cardiovascular:  no exercise intolerance, no chest pain, no palpitations Respiratory:  no cough or shortness of breath MSK: +b/l ankle pain Neurologic:  no chronic headaches, numbness or tingling  Exam BP 114/78 (BP Location: Left Arm, Patient Position: Sitting, Cuff Size: Large)   Pulse 68   Temp 98.1 F (36.7 C) (Oral)  Ht 5\' 6"  (1.676 m)   Wt 254 lb 3.2 oz (115.3 kg)   SpO2 96%   BMI 41.03 kg/m  General:  well developed, well nourished, in no apparent distress Skin:  warm, no pallor or diaphoresis Eyes:  pupils equal and round, sclera anicteric without injection Neck: neck supple without adenopathy, thyromegaly, masses, or bruits  Lungs:  clear to auscultation, breath sounds equal bilaterally Cardio:  regular rate and rhythm without murmurs, heart sounds without clicks or rubs, No LE edema Musculoskeletal:  symmetrical muscle groups noted without atrophy or deformity; +TTP over ankles b/l, neg squeeze Psych: well oriented with normal range of affect and appropriate judgment/insight  Essential hypertension - Plan: Basic Metabolic Panel (BMET)  Bilateral ankle pain  Orders as above.   Instructed on how to use home BP cuff for more accurate reading. I did watch her take it and then provided further instruction. Hold at heart level with relaxed arm. Will continue medication at current dosage for now. Recommended Power Step and provided ankle exercise for ankle issue. No  F/u in 12 weeks to recheck BP. The patient voiced understanding and agreement to the plan.  Morgan, DO 11/06/15  7:45 AM

## 2015-11-06 NOTE — Patient Instructions (Addendum)
Power Step is an over the counter insert that is cheapest on [employer] (feel free to shop around) and has the best efficacy for foot support.    RANGE OF MOTION - Dorsi/Plantar Flexion  While sitting with your right / left knee straight, draw the top of your foot upwards by flexing your ankle. Then reverse the motion, pointing your toes downward.  Hold each position for __________ seconds.  After completing your first set of exercises, repeat this exercise with your knee bent.   RANGE OF MOTION - Ankle Alphabet  Imagine your right / left big toe is a pen.  Keeping your hip and knee still, write out the entire alphabet with your "pen." Make the letters as large as you can without increasing any discomfort.    You may experience muscle soreness or fatigue, but the pain or discomfort you are trying to eliminate should never worsen during these exercises. If this pain does worsen, stop and make certain you are following the directions exactly. If the pain is still present after adjustments, discontinue the exercise until you can discuss the trouble with your clinician. STRENGTH - Dorsiflexors  Secure a rubber exercise band/tubing to a fixed object (i.e., table, pole) and loop the other end around your right / left foot.  Sit on the floor facing the fixed object. The band/tubing should be slightly tense when your foot is relaxed.  Slowly draw your foot back toward you using your ankle and toes.  Hold this position for __________ seconds. Slowly release the tension in the band and return your foot to the starting position.   STRENGTH - Plantar-flexors   Sit with your right / left leg extended. Holding onto both ends of a rubber exercise band/tubing, loop it around the ball of your foot. Keep a slight tension in the band.  Slowly push your toes away from you, pointing them downward.  Hold this position for __________ seconds. Return slowly, controlling the tension in the  band/tubing.   STRENGTH - Ankle Eversion  Secure one end of a rubber exercise band/tubing to a fixed object (table, pole). Loop the other end around your foot just before your toes.  Place your fists between your knees. This will focus your strengthening at your ankle.  Drawing the band/tubing across your opposite foot, slowly, pull your little toe out and up. Make sure the band/tubing is positioned to resist the entire motion.  Hold this position for __________ seconds. Have your muscles resist the band/tubing as it slowly pulls your foot back to the starting position.    STRENGTH - Ankle Inversion  Secure one end of a rubber exercise band/tubing to a fixed object (table, pole). Loop the other end around your foot just before your toes.  Place your fists between your knees. This will focus your strengthening at your ankle.  Slowly, pull your big toe up and in, making sure the band/tubing is positioned to resist the entire motion.  Hold this position for __________ seconds.  Have your muscles resist the band/tubing as it slowly pulls your foot back to the starting position.  STRENGTH - Towel Curls  Sit in a chair positioned on a non-carpeted surface.  Place your right / left foot on a towel, keeping your heel on the floor.  Pull the towel toward your heel by only curling your toes. Keep your heel on the floor.  If instructed by your physician, physical therapist, or athletic trainer, add weight to the end of the towel.

## 2015-11-16 ENCOUNTER — Encounter: Payer: Self-pay | Admitting: Family Medicine

## 2015-11-16 ENCOUNTER — Ambulatory Visit (INDEPENDENT_AMBULATORY_CARE_PROVIDER_SITE_OTHER): Payer: Managed Care, Other (non HMO) | Admitting: Family Medicine

## 2015-11-16 VITALS — BP 122/82 | HR 111 | Temp 97.8°F | Ht 63.0 in | Wt 256.4 lb

## 2015-11-16 DIAGNOSIS — R0683 Snoring: Secondary | ICD-10-CM | POA: Diagnosis not present

## 2015-11-16 DIAGNOSIS — I480 Paroxysmal atrial fibrillation: Secondary | ICD-10-CM | POA: Insufficient documentation

## 2015-11-16 DIAGNOSIS — E876 Hypokalemia: Secondary | ICD-10-CM

## 2015-11-16 DIAGNOSIS — I4891 Unspecified atrial fibrillation: Secondary | ICD-10-CM | POA: Diagnosis not present

## 2015-11-16 LAB — BASIC METABOLIC PANEL
BUN: 13 mg/dL (ref 6–23)
CHLORIDE: 104 meq/L (ref 96–112)
CO2: 28 mEq/L (ref 19–32)
Calcium: 8.9 mg/dL (ref 8.4–10.5)
Creatinine, Ser: 0.86 mg/dL (ref 0.40–1.20)
GFR: 87.49 mL/min (ref >60.00)
Glucose, Bld: 123 mg/dL — ABNORMAL HIGH (ref 70–99)
POTASSIUM: 3.9 meq/L (ref 3.5–5.1)
Sodium: 139 mEq/L (ref 135–145)

## 2015-11-16 MED ORDER — RIVAROXABAN 20 MG PO TABS: 20.0000 mg | ORAL_TABLET | Freq: Every day | ORAL | 1 refills | Status: DC

## 2015-11-16 MED FILL — XARELTO 20 MG TABLET: 20 | 30 days supply | Qty: 30 | Fill #0

## 2015-11-16 NOTE — Progress Notes (Signed)
Chief Complaint  Patient presents with  . Leg Pain    Pt reports feeling palpitations in her hurt with weakness in her both legs     Subjective: Patient is a 55 y.o. female here for palpitations and leg weakness.  Both started this morning. She describes the palpitations as "jitteriness" in her chest. It has been constant over the past 90 min. Nothing appears to affect it. She has been compliant with medications. Was told she had an irregular heartbeat when she was 15, but nothing since then. Equal weakness in each leg, thighs not affected. Denies calf pain. No injury, change in activity, recent illness, cough, chest pain, shortness of breath, fevers, jaw pain, arm pain, or vision changes.  She has been told that she snores.  ROS: Heart: Denies chest pain, +palpitations Lungs: Denies SOB or cough  Family History  Problem Relation Age of Onset  . Cancer Paternal Aunt     breast  . Cancer Cousin     cervical  . Diabetes Mother   . Coronary artery disease Father   . Kidney disease Father   . Diabetes Father   . Cancer Father     prostate  . Hypertension Father   . Coronary artery disease Sister   . Kidney disease Sister   . Diabetes Sister   . Kidney disease Brother   . Stroke Brother   . Diabetes Maternal Grandmother   . Stroke Maternal Grandmother   . Diabetes Paternal Grandmother   . Stroke Paternal Grandmother   . Coronary artery disease Other     grandmother, uncle  . Kidney disease Other     grandmother  . Stroke Other     uncle  . Thyroid disease Other     aunt--hyperthyroid; aunt--hypothyroid  . Glaucoma Other     grandmother  . Hypertension Other     GM, F, sis, 3 bro  . Diabetes Other     aunts  . Colon cancer Neg Hx    Past Medical History:  Diagnosis Date  . Allergy    allergic rhinitis  . Anemia 06/2007   C-scope 06/2007: tics, neg hemocults 07/2007  . Anxiety   . Chest pain 12/2007   negative ECHO, neg stress test 06/2008  . Condyloma acuminatum     history of  . Hyperglycemia    borderline. A1c 5.7  12/2007  . Hypertension    Allergies  Allergen Reactions  . Codeine Nausea And Vomiting    Current Outpatient Prescriptions:  .  carvedilol (COREG) 25 MG tablet, TAKE 1 TABLET (25 MG) BY MOUTH 2 TIMES A DAY WITH A MEAL., Disp: 180 tablet, Rfl: 1 .  lisinopril-hydrochlorothiazide (PRINZIDE,ZESTORETIC) 20-12.5 MG tablet, Take 1 tablet by mouth daily., Disp: 30 tablet, Rfl: 2 .  Multiple Vitamin (MULTIVITAMIN WITH MINERALS) TABS tablet, Take 1 tablet by mouth at bedtime. ALIVE multi-vitamin, Disp: , Rfl:  .  omeprazole (PRILOSEC) 40 MG capsule, Take 1 capsule (40 mg total) by mouth daily. (Patient taking differently: Take 40 mg by mouth 2 (two) times daily as needed (acid reflux/ indigestion). ), Disp: 30 capsule, Rfl: 3 .  potassium chloride (KLOR-CON 10) 10 MEQ tablet, Take 1 tablet (10 mEq total) by mouth 2 (two) times daily., Disp: 10 tablet, Rfl: 0 .  rivaroxaban (XARELTO) 20 MG TABS tablet, Take 1 tablet (20 mg total) by mouth daily with supper., Disp: 30 tablet, Rfl: 1  Objective: BP 122/82 (BP Location: Left Arm, Patient Position: Sitting, Cuff Size: Large)  Pulse (!) 111   Temp 97.8 F (36.6 C) (Oral)   Ht 5\' 3"  (1.6 m)   Wt 256 lb 6.4 oz (116.3 kg)   SpO2 97%   BMI 45.42 kg/m  General: Awake, appears stated age HEENT: MMM, EOMi, PERRLA Neck: No masses or adenopathy Heart: Irreg irreg rhythm, tachycardic, no murmurs, 1+ LE edema b/l, no bruits Lungs: CTAB, no rales, wheezes or rhonchi. Normal effort wo accessory muscle use MSK: No calf pain b/l to palpation, gait is normal Psych: Age appropriate judgment and insight, normal affect and mood  Assessment and Plan: Atrial fibrillation, unspecified type ([place]) - Plan: EKG 12-Lead, Holter monitor - 24 hour, Ambulatory referral to Cardiology, ECHOCARDIOGRAM COMPLETE, rivaroxaban (XARELTO) 20 MG TABS tablet  Snoring - Plan: Ambulatory referral to Pulmonology  Morbid obesity  ([place]) - Plan: Ambulatory referral to Pulmonology  Hypokalemia - Plan: Basic Metabolic Panel (BMET)  Orders as above. Xarelto samples given. TSH normal 3 weeks ago. EKG shows A fib, not in RVR.  Spoke with on call cardiologist who also recommended 24-hour Holter, Xarelto, and referral.  F/u with cardiology a fib clinic out patient. ER if she starts developing chest pain, SOB, worsening symptoms. The patient voiced understanding and agreement to the plan.  [place], DO 11/16/15  11:06 AM

## 2015-11-16 NOTE — Patient Instructions (Addendum)
Per Dr. Nani Ravens: Patient was given Xarelto 20mg  #22 tablets total.  Patient is to take 1 tablet by mouth daily, until she is to see Cardiologist.//AB/CMA

## 2015-11-16 NOTE — Progress Notes (Signed)
Pre visit review using our clinic review tool, if applicable. No additional management support is needed unless otherwise documented below in the visit note. 

## 2015-11-16 NOTE — Progress Notes (Signed)
Results were given at 11/16/15 appointment and patient was alerted of Rx sent to pharmacy.

## 2015-11-20 ENCOUNTER — Encounter: Payer: Self-pay | Admitting: Cardiology

## 2015-11-20 ENCOUNTER — Ambulatory Visit (INDEPENDENT_AMBULATORY_CARE_PROVIDER_SITE_OTHER): Payer: Managed Care, Other (non HMO) | Admitting: Cardiology

## 2015-11-20 VITALS — BP 144/92 | HR 80 | Ht 62.0 in | Wt 257.8 lb

## 2015-11-20 DIAGNOSIS — I480 Paroxysmal atrial fibrillation: Secondary | ICD-10-CM

## 2015-11-20 DIAGNOSIS — Z79899 Other long term (current) drug therapy: Secondary | ICD-10-CM

## 2015-11-20 MED ORDER — FLECAINIDE ACETATE 50 MG PO TABS: 75.0000 mg | ORAL_TABLET | Freq: Two times a day (BID) | ORAL | 3 refills | Status: DC

## 2015-11-20 MED FILL — FLECAINIDE ACETATE 50 MG TA: 50 | 30 days supply | Qty: 90 | Fill #0

## 2015-11-20 NOTE — Progress Notes (Signed)
Electrophysiology Office Note   Date:  [id number]   ID:  Jennifer [name], DOB Apr 08, 1960, MRN [id number]  PCP:  Nance Pear., NP  Primary Electrophysiologist:  Constance Haw, MD    Chief Complaint  Patient presents with  . New Patient (Initial Visit)    New Afib     History of Present Illness: Jennifer [name] is a 55 y.o. female who presents today for electrophysiology evaluation.   Hx HTN, HLD presented to PCP with jitteriness in chest. Found to have atrial fibrillation.    Today, she denies symptoms of palpitations, chest pain, shortness of breath, orthopnea, PND, lower extremity edema, claudication, dizziness, presyncope, syncope, bleeding, or neurologic sequela. The patient is tolerating medications without difficulties and is otherwise without complaint today. She says that when she is in atrial fibrillation, she has a little bit of fatigue but mainly palpitations in her chest and an electric feeling all over her body. She also has a fullness in her neck. She feels well today without similar symptoms.   Past Medical History:  Diagnosis Date  . Allergy    allergic rhinitis  . Anemia 06/2007   C-scope 06/2007: tics, neg hemocults 07/2007  . Anxiety   . Chest pain 12/2007   negative ECHO, neg stress test 06/2008  . Condyloma acuminatum    history of  . Hyperglycemia    borderline. A1c 5.7  12/2007  . Hypertension    Past Surgical History:  Procedure Laterality Date  . ABDOMINAL HYSTERECTOMY     no oophorectomy  . BREAST SURGERY  age 29   Fibroid tumor removed right breast  . CARPAL TUNNEL RELEASE    . CESAREAN SECTION    . CHOLECYSTECTOMY       Current Outpatient Prescriptions  Medication Sig Dispense Refill  . carvedilol (COREG) 25 MG tablet TAKE 1 TABLET (25 MG) BY MOUTH 2 TIMES A DAY WITH A MEAL. 180 tablet 1  . lisinopril-hydrochlorothiazide (PRINZIDE,ZESTORETIC) 20-12.5 MG tablet Take 1 tablet by mouth daily. 30 tablet 2  . Multiple  Vitamin (MULTIVITAMIN WITH MINERALS) TABS tablet Take 1 tablet by mouth at bedtime. ALIVE multi-vitamin    . omeprazole (PRILOSEC) 40 MG capsule Take 40 mg by mouth 2 (two) times daily as needed (acid reflux).    . rivaroxaban (XARELTO) 20 MG TABS tablet Take 1 tablet (20 mg total) by mouth daily with supper. 30 tablet 1  . flecainide (TAMBOCOR) 50 MG tablet Take 1.5 tablets (75 mg total) by mouth 2 (two) times daily. 90 tablet 3   No current facility-administered medications for this visit.     Allergies:   Codeine   Social History:  The patient  reports that she quit smoking about 11 years ago. Her smoking use included Cigarettes. She has a 13.50 pack-year smoking history. She has never used smokeless tobacco. She reports that she does not drink alcohol or use drugs.   Family History:  The patient's family history includes Breast cancer in her paternal aunt; Cervical cancer in her cousin; Coronary artery disease in her father, other, and sister; Diabetes in her father, maternal grandmother, mother, other, paternal grandmother, and sister; Glaucoma in her other; Healthy in her child, child, and child; Hypertension in her brother, father, other, and sister; Hyperthyroidism in her other; Hypothyroidism in her other; Kidney disease in her brother, father, other, and sister; Prostate cancer in her father; Stroke in her brother, maternal grandmother, other, and paternal grandmother; Thyroid disease in her other.  ROS:  Please see the history of present illness.   Otherwise, review of systems is positive for palpitations, cough.   All other systems are reviewed and negative.    PHYSICAL EXAM: VS:  BP (!) 144/92   Pulse 80   Ht 5\' 2"  (1.575 m)   Wt 257 lb 12.8 oz (116.9 kg)   BMI 47.15 kg/m  , BMI Body mass index is 47.15 kg/m. GEN: Well nourished, well developed, in no acute distress  HEENT: normal  Neck: no JVD, carotid bruits, or masses Cardiac: RRR; no murmurs, rubs, or gallops,no edema    Respiratory:  clear to auscultation bilaterally, normal work of breathing GI: soft, nontender, nondistended, + BS MS: no deformity or atrophy  Skin: warm and dry Neuro:  Strength and sensation are intact Psych: euthymic mood, full affect  EKG:  EKG is not ordered today. Personal review of the ekg ordered 11/16/15 shows atrial fibrillation, PRWP, rate 92  Recent Labs: 03/10/2015: ALT 13 10/08/2015: Hemoglobin 12.8; Platelets 310 10/20/2015: TSH 1.77 11/16/2015: BUN 13; Creatinine, Ser 0.86; Potassium 3.9; Sodium 139    Lipid Panel     Component Value Date/Time   CHOL 180 03/10/2015 0830   TRIG 42.0 03/10/2015 0830   HDL 51.90 03/10/2015 0830   CHOLHDL 3 03/10/2015 0830   VLDL 8.4 03/10/2015 0830   LDLCALC 120 (H) 03/10/2015 0830     Wt Readings from Last 3 Encounters:  11/20/15 257 lb 12.8 oz (116.9 kg)  11/16/15 256 lb 6.4 oz (116.3 kg)  11/06/15 254 lb 3.2 oz (115.3 kg)      Other studies Reviewed: Additional studies/ records that were reviewed today include: PCP notes   ASSESSMENT AND PLAN:  1.  Paroxysmal atrial fibrillation:Symptoms of palpitations that are limiting her daily activities. Feels like a rhythm control strategy would likely be the most appropriate. Discussed the risks and benefits of multiple antiarrhythmics including amiodarone, decrease in, and flecainide. At this time it seems that flecainide would be the best option. We'll plan to start 75 mg twice a day and get a stress test in 1 week. I also discussed with her the importance of diet and exercise and referred her to weight loss clinic. I also encouraged her to schedule the sleep study as previously ordered to see if she has sleep apnea. She has an echo scheduled for 10/18.   This patients CHA2DS2-VASc Score and unadjusted Ischemic Stroke Rate (% per year) is equal to 2.2 % stroke rate/year from a score of 2  Above score calculated as 1 point each if present [CHF, HTN, DM, Vascular=MI/PAD/Aortic Plaque,  Age if 65-74, or Female] Above score calculated as 2 points each if present [Age > 75, or Stroke/TIA/TE]   2. Hypertension: Elevated today on Coreg, lisinopril, and HCTZ. I have asked her to check her blood pressure at home and let us know the results are.   Current medicines are reviewed at length with the patient today.   The patient does not have concerns regarding her medicines.  The following changes were made today:  flecainide  Labs/ tests ordered today include:  Orders Placed This Encounter  Procedures  . EXERCISE TOLERANCE TEST     Disposition:   FU with Corben Auzenne 3 months  Signed, Inaya Gillham Meredith Leeds, MD  11/20/2015 3:52 PM     [address] [place] [address] [phone] (office) [phone] (fax)

## 2015-11-20 NOTE — Patient Instructions (Addendum)
Medication Instructions:  Your physician has recommended you make the following change in your medication:  1) START Flecainide 75 mg twice a day  -- start this medication 7-10 days prior to stress test  Labwork: None ordered  Testing/Procedures: Your physician has requested that you have an exercise tolerance test. For further information please visit [email or web address]. Please also follow instruction sheet, as given.  Your physician has recommended that you have a sleep study. This test records several body functions during sleep, including: brain activity, eye movement, oxygen and carbon dioxide blood levels, heart rate and rhythm, breathing rate and rhythm, the flow of air through your mouth and nose, snoring, body muscle movements, and chest and belly movement.  Denorris Reust, RN will talk with your primary physician.  If they have not started the process then our office will arrange sleep study.  Follow-Up: Your physician recommends that you schedule a follow-up appointment in: 3 months with Dr. Curt Bears.  If you need a refill on your cardiac medications before your next appointment, please call your pharmacy.  Thank you for choosing [facility]!!   Trinidad Curet, RN [phone]   Any Other Special Instructions Will Be Listed Below (If Applicable). Flecainide tablets What is this medicine? FLECAINIDE (FLEK a nide) is an antiarrhythmic drug. This medicine is used to prevent irregular heart rhythm. It can also slow down fast heartbeats called tachycardia. This medicine may be used for other purposes; ask your health care provider or pharmacist if you have questions. What should I tell my health care provider before I take this medicine? They need to know if you have any of these conditions: -abnormal levels of potassium in the blood -heart disease including heart rhythm and heart rate problems -kidney or liver disease -recent heart attack -an unusual or allergic reaction to  flecainide, local anesthetics, other medicines, foods, dyes, or preservatives -pregnant or trying to get pregnant -breast-feeding How should I use this medicine? Take this medicine by mouth with a glass of water. Follow the directions on the prescription label. You can take this medicine with or without food. Take your doses at regular intervals. Do not take your medicine more often than directed. Do not stop taking this medicine suddenly. This may cause serious, heart-related side effects. If your doctor wants you to stop the medicine, the dose may be slowly lowered over time to avoid any side effects. Talk to your pediatrician regarding the use of this medicine in children. While this drug may be prescribed for children as young as 1 year of age for selected conditions, precautions do apply. Overdosage: If you think you have taken too much of this medicine contact a poison control center or emergency room at once. NOTE: This medicine is only for you. Do not share this medicine with others. What if I miss a dose? If you miss a dose, take it as soon as you can. If it is almost time for your next dose, take only that dose. Do not take double or extra doses. What may interact with this medicine? Do not take this medicine with any of the following medications: -amoxapine -arsenic trioxide -certain antibiotics like clarithromycin, erythromycin, gatifloxacin, gemifloxacin, levofloxacin, moxifloxacin, sparfloxacin, or troleandomycin -certain antidepressants called tricyclic antidepressants like amitriptyline, imipramine, or nortriptyline -certain medicines to control heart rhythm like disopyramide, dofetilide, encainide, moricizine, procainamide, propafenone, and quinidine -cisapride -cyclobenzaprine -delavirdine -droperidol -haloperidol -hawthorn -imatinib -levomethadyl -maprotiline -medicines for malaria like chloroquine and halofantrine -pentamidine -phenothiazines like chlorpromazine,  mesoridazine, prochlorperazine, thioridazine -pimozide -quinine -  ranolazine -ritonavir -sertindole -ziprasidone This medicine may also interact with the following medications: -cimetidine -medicines for angina or high blood pressure -medicines to control heart rhythm like amiodarone and digoxin This list may not describe all possible interactions. Give your health care provider a list of all the medicines, herbs, non-prescription drugs, or dietary supplements you use. Also tell them if you smoke, drink alcohol, or use illegal drugs. Some items may interact with your medicine. What should I watch for while using this medicine? Visit your doctor or health care professional for regular checks on your progress. Because your condition and the use of this medicine carries some risk, it is a good idea to carry an identification card, necklace or bracelet with details of your condition, medications and doctor or health care professional. Check your blood pressure and pulse rate regularly. Ask your health care professional what your blood pressure and pulse rate should be, and when you should contact him or her. Your doctor or health care professional also may schedule regular blood tests and electrocardiograms to check your progress. You may get drowsy or dizzy. Do not drive, use machinery, or do anything that needs mental alertness until you know how this medicine affects you. Do not stand or sit up quickly, especially if you are an older patient. This reduces the risk of dizzy or fainting spells. Alcohol can make you more dizzy, increase flushing and rapid heartbeats. Avoid alcoholic drinks. What side effects may I notice from receiving this medicine? Side effects that you should report to your doctor or health care professional as soon as possible: -chest pain, continued irregular heartbeats -difficulty breathing -swelling of the legs or feet -trembling, shaking -unusually weak or tired Side  effects that usually do not require medical attention (report to your doctor or health care professional if they continue or are bothersome): -blurred vision -constipation -headache -nausea, vomiting -stomach pain This list may not describe all possible side effects. Call your doctor for medical advice about side effects. You may report side effects to FDA at 1-800-FDA-1088. Where should I keep my medicine? Keep out of the reach of children. Store at room temperature between 15 and 30 degrees C (59 and 86 degrees F). Protect from light. Keep container tightly closed. Throw away any unused medicine after the expiration date. NOTE: This sheet is a summary. It may not cover all possible information. If you have questions about this medicine, talk to your doctor, pharmacist, or health care provider.    2016, Elsevier/Gold Standard. (2007-06-03 16:46:09)    Exercise Stress Echocardiogram An exercise stress echocardiogram is a heart (cardiac) test used to check the function of your heart. This test may also be called an exercise stress echocardiography or stress echo. This stress test will check how well your heart muscle and valves are working and determine if your heart muscle is getting enough blood. You will exercise on a treadmill to naturally increase or stress the functioning of your heart.  An echocardiogram uses sound waves (ultrasound) to produce an image of your heart. If your heart does not work normally, it may indicate coronary artery disease with poor coronary blood supply. The coronary arteries are the arteries that bring blood and oxygen to your heart. LET [facility] CARE PROVIDER KNOW ABOUT:  Any allergies you have.  All medicines you are taking, including vitamins, herbs, eye drops, creams, and over-the-counter medicines.  Previous problems you or members of your family have had with the use of anesthetics.  Any blood  disorders you have.  Previous surgeries you have  had.  Medical conditions you have.  Possibility of pregnancy, if this applies. RISKS AND COMPLICATIONS Generally, this is a safe procedure. However, as with any procedure, complications can occur. Possible complications can include:  You develop pain or pressure in the following areas:  Chest.  Jaw or neck.  Between your shoulder blades.  Radiating down your left arm.  Dizziness or lightheadedness.  Shortness of breath.  Increased or irregular heartbeat.  Nausea or vomiting.  Heart attack (rare). BEFORE THE PROCEDURE  Avoid all forms of caffeine for 24 hours before your test or as directed by your health care provider. This includes coffee, tea (even decaffeinated tea), caffeinated sodas, chocolate, cocoa, and certain pain medicines.  Follow your health care provider's instructions regarding eating and drinking before the test.  Take your medicines as directed at regular times with water unless instructed otherwise. Exceptions may include:  If you have diabetes, ask how you are to take your insulin or pills. It is common to adjust insulin dosing the morning of the test.  If you are taking beta-blocker medicines, it is important to talk to your health care provider about these medicines well before the date of your test. Taking beta-blocker medicines may interfere with the test. In some cases, these medicines need to be changed or stopped 24 hours or more before the test.  If you wear a nitroglycerin patch, it may need to be removed prior to the test. Ask your health care provider if the patch should be removed before the test.  If you use an inhaler for any breathing condition, bring it with you to the test.  If you are an outpatient, bring a snack so you can eat right after the stress phase of the test.  Do not smoke for 4 hours prior to the test or as directed by your health care provider.  Wear loose-fitting clothes and comfortable shoes for the test. This test  involves walking on a treadmill. PROCEDURE   Multiple electrodes will be put on your chest. If needed, small areas of your chest may be shaved to get better contact with the electrodes. Once the electrodes are attached to your body, multiple wires will be attached to the electrodes, and your heart rate will be monitored.  You will have an echocardiogram done at rest.  To produce this image of your heart, gel is applied to your chest, and a wand-like tool (transducer) is moved over the chest. The transducer sends the sound waves through the chest to create the moving images of your heart.  You may need an IV to receive a medication that improves the quality of the pictures.  You will then walk on a treadmill. The treadmill will be started at a slow pace. The treadmill speed and incline will gradually be increased to raise your heart rate.  At the peak of exercise, the treadmill will be stopped. You will lie down immediately on a bed so that a second echocardiogram can be done to visualize your heart's motion with exercise.  The test usually takes 30-60 minutes to complete. AFTER THE PROCEDURE  Your heart rate and blood pressure will be monitored after the test.  You may return to your normal schedule, including diet, activities, and medicines, unless your health care provider tells you otherwise.   This information is not intended to replace advice given to you by your health care provider. Make sure you discuss any questions you  have with your health care provider.   Document Released: 02/02/2004 Document Revised: 02/02/2013 Document Reviewed: 10/05/2012 Elsevier Interactive Patient Education [employer].

## 2015-11-24 ENCOUNTER — Telehealth: Payer: Self-pay | Admitting: Cardiology

## 2015-11-24 NOTE — Telephone Encounter (Signed)
She describes a "slight HA yesterday"  and today a "really bad one".  Asked if she could decrease dose of Flecainide - I advised her not to decrease dosage. Inquired if she has taken anything for HA and she reports she has not.  Her statement, "doctor started me on that Xarelto and told me that I needed to avoid Ibuprofen".  Advised patient to try Tylenol to see if improvement in HA.   Also discussed giving more time on medication before determining if HA related to med or not. She is going to continue through the weekend.  She will call office on Monday if HA remains -- she understands I will discuss new treatment plan with physician if she reports continued HA. She thanks me for calling and talking with her.

## 2015-11-24 NOTE — Telephone Encounter (Signed)
Pt called about FLECANIDE 50mg  It is giving her a headache and would like a call back on how she should dose it.

## 2015-11-29 ENCOUNTER — Ambulatory Visit (HOSPITAL_BASED_OUTPATIENT_CLINIC_OR_DEPARTMENT_OTHER)
Admission: RE | Admit: 2015-11-29 | Discharge: 2015-11-29 | Disposition: A | Discharge status: Home / Self Care | Payer: Managed Care, Other (non HMO) | Source: Ambulatory Visit | Attending: Family Medicine | Admitting: Family Medicine

## 2015-11-29 ENCOUNTER — Encounter: Payer: Self-pay | Admitting: Cardiology

## 2015-11-29 DIAGNOSIS — I4891 Unspecified atrial fibrillation: Secondary | ICD-10-CM | POA: Insufficient documentation

## 2015-11-29 DIAGNOSIS — I313 Pericardial effusion (noninflammatory): Secondary | ICD-10-CM | POA: Diagnosis not present

## 2015-11-29 DIAGNOSIS — E785 Hyperlipidemia, unspecified: Secondary | ICD-10-CM | POA: Insufficient documentation

## 2015-11-29 DIAGNOSIS — I119 Hypertensive heart disease without heart failure: Secondary | ICD-10-CM | POA: Insufficient documentation

## 2015-11-29 DIAGNOSIS — Z87891 Personal history of nicotine dependence: Secondary | ICD-10-CM | POA: Insufficient documentation

## 2015-11-29 NOTE — Progress Notes (Signed)
  Echocardiogram 2D Echocardiogram has been performed.  Jennifer [name] M 11/29/2015, 9:02 AM

## 2015-12-01 ENCOUNTER — Ambulatory Visit (INDEPENDENT_AMBULATORY_CARE_PROVIDER_SITE_OTHER): Payer: Managed Care, Other (non HMO)

## 2015-12-01 DIAGNOSIS — Z79899 Other long term (current) drug therapy: Secondary | ICD-10-CM

## 2015-12-01 DIAGNOSIS — I480 Paroxysmal atrial fibrillation: Secondary | ICD-10-CM

## 2015-12-01 LAB — EXERCISE TOLERANCE TEST
CHL CUP MPHR: 164 {beats}/min
CHL CUP RESTING HR STRESS: 60 {beats}/min
CHL CUP STRESS STAGE 1 HR: 65 {beats}/min
CHL CUP STRESS STAGE 1 SBP: 146 mmHg
CHL CUP STRESS STAGE 2 GRADE: 0 %
CHL CUP STRESS STAGE 2 SPEED: 1 mph
CHL CUP STRESS STAGE 3 GRADE: 0 %
CHL CUP STRESS STAGE 3 SPEED: 1 mph
CHL CUP STRESS STAGE 4 DBP: 79 mmHg
CHL CUP STRESS STAGE 4 GRADE: 10 %
CHL CUP STRESS STAGE 4 HR: 108 {beats}/min
CHL CUP STRESS STAGE 4 SBP: 178 mmHg
CHL CUP STRESS STAGE 5 HR: 114 {beats}/min
CHL CUP STRESS STAGE 6 SBP: 188 mmHg
CHL CUP STRESS STAGE 6 SPEED: 0 mph
CHL CUP STRESS STAGE 7 GRADE: 0 %
CHL CUP STRESS STAGE 7 HR: 72 {beats}/min
CHL CUP STRESS STAGE 7 SBP: 152 mmHg
CHL CUP STRESS STAGE 7 SPEED: 0 mph
CHL RATE OF PERCEIVED EXERTION: 17
CSEPED: 3 min
CSEPEDS: 55 s
CSEPEW: 5.6 METS
CSEPHR: 70 %
Peak HR: 114 {beats}/min
Percent of predicted max HR: 69 %
Stage 1 DBP: 78 mmHg
Stage 1 Grade: 0 %
Stage 1 Speed: 0 mph
Stage 2 HR: 68 {beats}/min
Stage 3 HR: 68 {beats}/min
Stage 4 Speed: 1.7 mph
Stage 5 Grade: 12 %
Stage 5 Speed: 2.5 mph
Stage 6 DBP: 69 mmHg
Stage 6 Grade: 0 %
Stage 6 HR: 99 {beats}/min
Stage 7 DBP: 74 mmHg

## 2015-12-01 MED FILL — CARVEDILOL 25 MG TABLET: 25 | 30 days supply | Qty: 60 | Fill #1

## 2015-12-01 MED FILL — LISINOPRIL-HCTZ 20-12.5 MG: 20-12.5 | 30 days supply | Qty: 30 | Fill #1

## 2015-12-05 ENCOUNTER — Encounter: Payer: Self-pay | Admitting: *Deleted

## 2015-12-05 ENCOUNTER — Telehealth: Payer: Self-pay | Admitting: *Deleted

## 2015-12-05 DIAGNOSIS — R0683 Snoring: Secondary | ICD-10-CM

## 2015-12-05 DIAGNOSIS — R400 Somnolence: Secondary | ICD-10-CM

## 2015-12-05 NOTE — Telephone Encounter (Signed)
This encounter was created in error - please disregard.

## 2015-12-05 NOTE — Telephone Encounter (Signed)
Left detailed on pt's cellphone (ok per DPR) explaining that I spoke with PCP office who tells me they have not started sleep study order/test.  Explained that we would order sleep study and begin process.  Asked to call office if she had further questions.

## 2015-12-08 ENCOUNTER — Encounter: Payer: Self-pay | Admitting: *Deleted

## 2015-12-08 ENCOUNTER — Telehealth: Payer: Self-pay | Admitting: *Deleted

## 2015-12-08 ENCOUNTER — Encounter: Payer: Self-pay | Admitting: Cardiology

## 2015-12-08 DIAGNOSIS — I480 Paroxysmal atrial fibrillation: Secondary | ICD-10-CM

## 2015-12-08 DIAGNOSIS — Z79899 Other long term (current) drug therapy: Secondary | ICD-10-CM

## 2015-12-08 NOTE — Telephone Encounter (Signed)
-----   Message from Will Meredith Leeds, MD sent at 12/05/2015 10:57 AM EDT ----- Did not get adequate HR response.  Would plan for Lexi myoview to eval for ischema.

## 2015-12-08 NOTE — Telephone Encounter (Signed)
This encounter was created in error - please disregard.

## 2015-12-08 NOTE — Telephone Encounter (Signed)
Reviewed results with patient.  She is agreeable to [facility] and understands office will call her to arrange. Myoview instructions sent to pt via MyChart

## 2015-12-08 NOTE — Telephone Encounter (Signed)
Follow Up:    Returning your call,if you do not get her now,please call her at 11:30.

## 2015-12-25 ENCOUNTER — Telehealth (HOSPITAL_COMMUNITY): Payer: Self-pay | Admitting: *Deleted

## 2015-12-25 NOTE — Telephone Encounter (Signed)
Left message on voicemail per DPR in reference to upcoming appointment scheduled on 12/27/15 at 1230 with detailed instructions given per Myocardial Perfusion Study Information Sheet for the test. LM to arrive 15 minutes early, and that it is imperative to arrive on time for appointment to keep from having the test rescheduled. If you need to cancel or reschedule your appointment, please call the office within 24 hours of your appointment. Failure to do so may result in a cancellation of your appointment, and a $50 no show fee. Phone number given for call back for any questions.

## 2015-12-27 ENCOUNTER — Ambulatory Visit (HOSPITAL_COMMUNITY): Payer: Managed Care, Other (non HMO) | Attending: Cardiology

## 2015-12-27 DIAGNOSIS — I480 Paroxysmal atrial fibrillation: Secondary | ICD-10-CM | POA: Insufficient documentation

## 2015-12-27 DIAGNOSIS — Z79899 Other long term (current) drug therapy: Secondary | ICD-10-CM | POA: Insufficient documentation

## 2015-12-27 MED ORDER — REGADENOSON 0.4 MG/5ML IV SOLN
0.4000 mg | Freq: Once | INTRAVENOUS | Status: AC
  Administered 2015-12-27: 0.4 mg via INTRAVENOUS

## 2015-12-27 MED ORDER — TECHNETIUM TC 99M TETROFOSMIN IV KIT
31.8000 | PACK | Freq: Once | INTRAVENOUS | Status: AC | PRN
  Administered 2015-12-27: 31.8 via INTRAVENOUS
  Filled 2015-12-27: qty 32

## 2015-12-28 ENCOUNTER — Ambulatory Visit (HOSPITAL_COMMUNITY): Payer: Managed Care, Other (non HMO) | Attending: Cardiology

## 2015-12-28 LAB — MYOCARDIAL PERFUSION IMAGING
CHL CUP NUCLEAR SRS: 4
CHL CUP NUCLEAR SSS: 6
LV sys vol: 29 mL
LVDIAVOL: 79 mL (ref 46–106)
NUC STRESS TID: 0.85
Peak HR: 94 {beats}/min
RATE: 0.4
Rest HR: 61 {beats}/min
SDS: 2

## 2015-12-28 MED ORDER — TECHNETIUM TC 99M TETROFOSMIN IV KIT
32.0000 | PACK | Freq: Once | INTRAVENOUS | Status: AC | PRN
  Administered 2015-12-28: 32 via INTRAVENOUS
  Filled 2015-12-28: qty 32

## 2016-01-02 ENCOUNTER — Telehealth: Payer: Self-pay | Admitting: Cardiology

## 2016-01-02 NOTE — Telephone Encounter (Signed)
Notified the pt that per Dr Curt Bears, she had a low risk nuclear study, as indicated below.  Pt verbalized understanding.     Notes Recorded by Will Meredith Leeds, MD on 12/29/2015 at 11:14 AM EST Low risk nuclear study.

## 2016-01-02 NOTE — Telephone Encounter (Signed)
Follow Up:   Pt returning a call from yesterday,she did not know who called her.If not at the phone number listed,please call [phone].

## 2016-01-03 MED FILL — LISINOPRIL-HCTZ 20-12.5 MG: 20-12.5 | 30 days supply | Qty: 30 | Fill #2

## 2016-01-03 MED FILL — CARVEDILOL 25 MG TABLET: 25 | 30 days supply | Qty: 60 | Fill #2

## 2016-01-12 ENCOUNTER — Other Ambulatory Visit: Payer: Self-pay | Admitting: Family

## 2016-01-12 ENCOUNTER — Encounter: Payer: Self-pay | Admitting: Family

## 2016-01-12 ENCOUNTER — Ambulatory Visit (INDEPENDENT_AMBULATORY_CARE_PROVIDER_SITE_OTHER): Payer: Managed Care, Other (non HMO) | Admitting: Family

## 2016-01-12 VITALS — BP 130/78 | HR 67 | Temp 98.3°F | Resp 16 | Ht 62.0 in | Wt 254.0 lb

## 2016-01-12 DIAGNOSIS — I10 Essential (primary) hypertension: Secondary | ICD-10-CM | POA: Diagnosis not present

## 2016-01-12 DIAGNOSIS — I4891 Unspecified atrial fibrillation: Secondary | ICD-10-CM

## 2016-01-12 DIAGNOSIS — R739 Hyperglycemia, unspecified: Secondary | ICD-10-CM

## 2016-01-12 DIAGNOSIS — R319 Hematuria, unspecified: Secondary | ICD-10-CM | POA: Diagnosis not present

## 2016-01-12 DIAGNOSIS — Z1231 Encounter for screening mammogram for malignant neoplasm of breast: Secondary | ICD-10-CM

## 2016-01-12 LAB — URINALYSIS, ROUTINE W REFLEX MICROSCOPIC
Bilirubin Urine: NEGATIVE
Ketones, ur: NEGATIVE
Leukocytes, UA: NEGATIVE
NITRITE: NEGATIVE
SPECIFIC GRAVITY, URINE: 1.015 (ref 1.000–1.030)
Total Protein, Urine: NEGATIVE
Urine Glucose: NEGATIVE
Urobilinogen, UA: 0.2 (ref 0.0–1.0)
pH: 6 (ref 5.0–8.0)

## 2016-01-12 MED ORDER — OLMESARTAN MEDOXOMIL-HCTZ 20-12.5 MG PO TABS
1.0000 | ORAL_TABLET | Freq: Every day | ORAL | 2 refills | Status: DC
Start: 2016-01-12 — End: 2016-05-01

## 2016-01-12 MED ORDER — RIVAROXABAN 20 MG PO TABS: 20.0000 mg | ORAL_TABLET | Freq: Every day | ORAL | 3 refills | Status: DC

## 2016-01-12 MED FILL — XARELTO 20 MG TABLET: 20 | 30 days supply | Qty: 30 | Fill #0

## 2016-01-12 MED FILL — OLMESARTAN-HCTZ 20-12.5 MG: 20-12.5 | 30 days supply | Qty: 30 | Fill #0

## 2016-01-12 NOTE — Patient Instructions (Addendum)
Stop lisinopril hct Start benicar hct.   Work on adding 30 minutes of exercise 5 days a week.  Limit white carbs and avoid concentrated sweets. Eat more fresh veggies and lean meat/fish.

## 2016-01-12 NOTE — Progress Notes (Signed)
Subjective:    Patient ID: Jennifer [name], female    DOB: [birth year], 55 y.o.   MRN: [id number]  HPI  Jennifer [name] is a 55 yr old female who presents today for follow up.  HTN- Initial BP reading was elevated this AM but follow up BP was much better. She is maintained on lisinopril-hctz and coreg.    BP Readings from Last 3 Encounters:  01/12/16 130/78  11/20/15 (!) 144/92  11/16/15 122/82   Hyperglycemia-  Lab Results  Component Value Date   HGBA1C 5.9 10/20/2015   AF- pt was noted to be in AF on 11/16/15 during her visit with Dr. Nani Ravens. She was referred to Dr. Curt Bears and placed on flecainide and xarelto.     Notes pink on tissue yesterday after urination. Denies dysuria.   Review of Systems See HPI  Past Medical History:  Diagnosis Date  . Allergy    allergic rhinitis  . Anemia 06/2007   C-scope 06/2007: tics, neg hemocults 07/2007  . Anxiety   . Chest pain 12/2007   negative ECHO, neg stress test 06/2008  . Condyloma acuminatum    history of  . Hyperglycemia    borderline. A1c 5.7  12/2007  . Hypertension      Social History   Social History  . Marital status: Married    Spouse name: N/A  . Number of children: 3  . Years of education: N/A   Occupational History  .  [employer]   Social History Main Topics  . Smoking status: Former Smoker    Packs/day: 0.50    Years: 27.00    Types: Cigarettes    Quit date: 02/12/2004  . Smokeless tobacco: Never Used  . Alcohol use No  . Drug use: No  . Sexual activity: Not on file   Other Topics Concern  . 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 ABDOMINAL HYSTERECTOMY     no oophorectomy  . BREAST SURGERY  age 66     Fibroid tumor removed right breast  . CARPAL TUNNEL RELEASE    . CESAREAN SECTION    . CHOLECYSTECTOMY      Family History  Problem Relation Age of Onset  . Breast cancer Paternal Aunt   . Cervical cancer Cousin   . Diabetes Mother   . Coronary artery disease Father   . Kidney disease Father   . Diabetes Father   . Hypertension Father   . Prostate cancer Father   . Coronary artery disease Sister   . Kidney disease Sister   . Diabetes Sister   . Hypertension Sister   . Kidney disease Brother   . Stroke Brother   . Hypertension Brother     x3  . Diabetes Maternal Grandmother   . Stroke Maternal Grandmother   . Diabetes Paternal Grandmother   . Stroke Paternal Grandmother   . Coronary artery disease Other     grandmother, uncle  . Kidney disease Other     grandmother  . Stroke Other     uncle  . Thyroid disease Other     aunts  .  Glaucoma Other     grandmother  . Hypertension Other     grandmother  . Diabetes Other     aunts  . Hypothyroidism Other     aunt  . Hyperthyroidism Other     aunt  . Healthy Child   . Healthy Child   . Healthy Child   . Colon cancer Neg Hx     Allergies  Allergen Reactions  . Codeine Nausea And Vomiting    Current Outpatient Prescriptions on File Prior to Visit  Medication Sig Dispense Refill  . carvedilol (COREG) 25 MG tablet TAKE 1 TABLET (25 MG) BY MOUTH 2 TIMES A DAY WITH A MEAL. 180 tablet 1  . flecainide (TAMBOCOR) 50 MG tablet Take 1.5 tablets (75 mg total) by mouth 2 (two) times daily. 90 tablet 3  . Multiple Vitamin (MULTIVITAMIN WITH MINERALS) TABS tablet Take 1 tablet by mouth at bedtime. ALIVE multi-vitamin     No current facility-administered medications on file prior to visit.     BP 130/78   Pulse 67   Temp 98.3 F (36.8 C) (Oral)   Resp 16   Ht 5\' 2"  (1.575 m)   Wt 254 lb (115.2 kg)   SpO2 99%   BMI 46.46 kg/m       Objective:   Physical Exam  Constitutional: She is oriented to person, place,  and time. She appears well-developed and well-nourished.  HENT:  Head: Normocephalic and atraumatic.  Cardiovascular: Normal rate, regular rhythm and normal heart sounds.   No murmur heard. Pulmonary/Chest: Effort normal and breath sounds normal. No respiratory distress. She has no wheezes.  Neurological: She is alert and oriented to person, place, and time.  Psychiatric: She has a normal mood and affect. Her behavior is normal. Judgment and thought content normal.          Assessment & Plan:  Hematuria- will obtain UA and culture to further evaluate.

## 2016-01-12 NOTE — Assessment & Plan Note (Signed)
BP stable, however she is bothered by the ACE cough and wishes to discontinue. D/c lisinopril hctz, begin benicar hct, follow up in 2 weeks for nurse visit BP check.

## 2016-01-12 NOTE — Assessment & Plan Note (Addendum)
Clinically stable on flecainide. Also anticoagulated with xarelto. Management per cardiology.

## 2016-01-12 NOTE — Assessment & Plan Note (Signed)
Discussed healthy diet, exercise, weight loss.  

## 2016-01-12 NOTE — Progress Notes (Signed)
Pre visit review using our clinic review tool, if applicable. No additional management support is needed unless otherwise documented below in the visit note. 

## 2016-01-14 ENCOUNTER — Telehealth: Payer: Self-pay | Admitting: Family

## 2016-01-14 LAB — URINE CULTURE

## 2016-01-14 MED ORDER — CEPHALEXIN 500 MG PO CAPS: 500.0000 mg | ORAL_CAPSULE | Freq: Three times a day (TID) | ORAL | 0 refills | Status: AC

## 2016-01-14 NOTE — Telephone Encounter (Signed)
Lab work shows UTI, begin keflex.

## 2016-01-15 MED FILL — FLECAINIDE ACETATE 50 MG TA: 50 | 30 days supply | Qty: 90 | Fill #1

## 2016-01-15 MED FILL — CEPHALEXIN 500 MG CAPSULE: 500 | 5 days supply | Qty: 15 | Fill #0

## 2016-01-15 NOTE — Telephone Encounter (Signed)
Left detailed message on cell and to call if any questions. 

## 2016-01-25 ENCOUNTER — Ambulatory Visit (HOSPITAL_BASED_OUTPATIENT_CLINIC_OR_DEPARTMENT_OTHER): Payer: Managed Care, Other (non HMO) | Attending: Cardiology | Admitting: Cardiovascular Disease

## 2016-01-25 VITALS — Ht 63.0 in | Wt 256.0 lb

## 2016-01-25 DIAGNOSIS — R0683 Snoring: Secondary | ICD-10-CM | POA: Insufficient documentation

## 2016-01-25 DIAGNOSIS — R400 Somnolence: Secondary | ICD-10-CM | POA: Insufficient documentation

## 2016-01-25 DIAGNOSIS — G4733 Obstructive sleep apnea (adult) (pediatric): Secondary | ICD-10-CM | POA: Diagnosis not present

## 2016-01-26 ENCOUNTER — Ambulatory Visit (INDEPENDENT_AMBULATORY_CARE_PROVIDER_SITE_OTHER): Payer: Managed Care, Other (non HMO) | Admitting: Family

## 2016-01-26 VITALS — BP 149/100 | HR 75

## 2016-01-26 DIAGNOSIS — I10 Essential (primary) hypertension: Secondary | ICD-10-CM | POA: Diagnosis not present

## 2016-01-26 LAB — BASIC METABOLIC PANEL
BUN: 14 mg/dL (ref 6–23)
CHLORIDE: 102 meq/L (ref 96–112)
CO2: 31 mEq/L (ref 19–32)
Calcium: 9.6 mg/dL (ref 8.4–10.5)
Creatinine, Ser: 0.79 mg/dL (ref 0.40–1.20)
GFR: 96.43 mL/min (ref >60.00)
GLUCOSE: 90 mg/dL (ref 70–99)
POTASSIUM: 4.1 meq/L (ref 3.5–5.1)
SODIUM: 142 meq/L (ref 135–145)

## 2016-01-26 MED ORDER — AMLODIPINE BESYLATE 5 MG PO TABS: 5.0000 mg | ORAL_TABLET | Freq: Every day | ORAL | 0 refills | Status: DC

## 2016-01-26 MED FILL — AMLODIPINE BESYLATE 5 MG TA: 5 | 30 days supply | Qty: 30 | Fill #0

## 2016-01-26 NOTE — Progress Notes (Signed)
Pre visit review using our clinic review tool, if applicable. No additional management support is needed unless otherwise documented below in the visit note.  Patient came into the office today for a blood pressure check per OV note 01/12/16. Reviewed medications with the patient. Readings were as follow: BP 165/93 P 72 & BP 149/100 P 75. Patient is asymptomatic at this time.  Per Inda Castle, NP: START Amlodipine 5 MG once daily. Continue current regimen with all other medications. Complete BMET (Basic Metabolic Panel) lab work today. Return in 2 weeks for a nurse visit to have blood pressure rechecked.  Informed patient of the provider's recommendations. She verbalized understanding and did not have any further questions or concerns prior to leaving the nurse visit.

## 2016-01-26 NOTE — Patient Instructions (Addendum)
Per Inda Castle, NP: START Amlodipine 5 MG once daily. Continue current regimen with all other medications. Complete BMET (Basic Metabolic Panel) lab work today. Return in 2 weeks for a nurse visit to have blood pressure rechecked.

## 2016-01-26 NOTE — Progress Notes (Signed)
Noted and agree. 

## 2016-01-27 ENCOUNTER — Ambulatory Visit (HOSPITAL_BASED_OUTPATIENT_CLINIC_OR_DEPARTMENT_OTHER)
Admission: RE | Admit: 2016-01-27 | Discharge: 2016-01-27 | Disposition: A | Discharge status: Home / Self Care | Payer: Managed Care, Other (non HMO) | Source: Ambulatory Visit | Attending: Family | Admitting: Family

## 2016-01-27 DIAGNOSIS — Z1231 Encounter for screening mammogram for malignant neoplasm of breast: Secondary | ICD-10-CM | POA: Insufficient documentation

## 2016-02-06 NOTE — Procedures (Signed)
Patient Name: Jennifer [name], [place] Date: 36/14/4315 Gender: Female D.O.B: [birth year] Age (years): 26 Referring Provider: Will Camnitz Height (inches): 63 Interpreting Physician: Shelva Majestic MD, ABSM Weight (lbs): 256 RPSGT: Gerhard Perches BMI: 37 MRN: [id number] Neck Size: 16.50  CLINICAL INFORMATION Sleep Study Type: Split Night CPAP  Indication for sleep study: Excessive Daytime Sleepiness, OSA, Snoring, PAF.  Epworth Sleepiness Score:  9  SLEEP STUDY TECHNIQUE As per the AASM Manual for the Scoring of Sleep and Associated Events v2.3 (April 2016) with a hypopnea requiring 4% desaturations.  The channels recorded and monitored were frontal, central and occipital EEG, electrooculogram (EOG), submentalis EMG (chin), nasal and oral airflow, thoracic and abdominal wall motion, anterior tibialis EMG, snore microphone, electrocardiogram, and pulse oximetry. Continuous positive airway pressure (CPAP) was initiated when the patient met split night criteria and was titrated according to treat sleep-disordered breathing.  MEDICATIONS amLODipine (NORVASC) 5 MG tablet carvedilol (COREG) 25 MG tablet flecainide (TAMBOCOR) 50 MG tablet Multiple Vitamin (MULTIVITAMIN WITH MINERALS) TABS tablet olmesartan-hydrochlorothiazide (BENICAR HCT) 20-12.5 MG tablet rivaroxaban (XARELTO) 20 MG TABS tablet  Medications self-administered by patient taken the night of the study : N/A  RESPIRATORY PARAMETERS Diagnostic  Total AHI (/hr): 26.5 RDI (/hr): 31.2 OA Index (/hr): 6.6 CA Index (/hr): 0.0 REM AHI (/hr): 78.4 NREM AHI (/hr): 14.0 Supine AHI (/hr): 17.8 Non-supine AHI (/hr): 27.49 Min O2 Sat (%): 69.00 Mean O2 (%): 91.56 Time below 88% (min): 19.2     Titration  Optimal Pressure (cm): 13 AHI at Optimal Pressure (/hr): 0.0 Min O2 at Optimal Pressure (%): 90.0 Supine % at Optimal (%): 100 Sleep % at Optimal (%): 95      SLEEP ARCHITECTURE The recording time for the entire  night was 391.5 minutes.  During a baseline period of 141.6 minutes, the patient slept for 127.0 minutes in REM and nonREM, yielding a sleep efficiency of 89.7%. Sleep onset after lights out was 8.0 minutes with a REM latency of 97.5 minutes. The patient spent 3.94% of the night in stage N1 sleep, 42.52% in stage N2 sleep, 34.25% in stage N3 and 19.29% in REM.  During the titration period of 243.0 minutes, the patient slept for 207.0 minutes in REM and nonREM, yielding a sleep efficiency of 85.2%. Sleep onset after CPAP initiation was 9.0 minutes with a REM latency of 131.5 minutes. The patient spent 2.17% of the night in stage N1 sleep, 68.36% in stage N2 sleep, 14.73% in stage N3 and 14.73% in REM.  CARDIAC DATA The 2 lead EKG demonstrated sinus rhythm. The mean heart rate was 74.51 beats per minute. Other EKG findings include: None.  LEG MOVEMENT DATA The total Periodic Limb Movements of Sleep (PLMS) were 0. The PLMS index was 0.00 .  IMPRESSIONS - Moderate obstructive sleep apnea overall during the diagnostic portion of the study (AHI = 26.5/hour); however, events were severe during REM Sleep (AHI 78.4/h).  CPAP was initiated at 5 cm and was titrated to 13 cm water pressure. An optimal PAP pressure was 13 cm of water pressure; AHI 0. - No significant central sleep apnea occurred during the diagnostic portion of the study (CAI = 0.0/hour). - Significant oxygen desaturation was noted during the diagnostic portion of the study  to a nadir of 74% during NREM and 69.00% during REM sleep. - The patient snored with Loud snoring volume during the diagnostic portion of the study. - No cardiac abnormalities were noted during this study. - Clinically significant periodic limb  movements did not occur during sleep.  DIAGNOSIS - Obstructive Sleep Apnea (327.23 [G47.33 ICD-10])  RECOMMENDATIONS - Recommeend an initial trial of CPAP therapy with EPR at 13 cm H2O with  heated humidification.  A Small size  Fisher[employer]kel Full Face Mask Simplus mask was used for the titration.  - Efforts should be made to optimize nasal and oropharyngeal patency. - Avoid alcohol, sedatives and other CNS depressants that may worsen sleep apnea and disrupt normal sleep architecture. - Sleep hygiene should be reviewed to assess factors that may improve sleep quality. - Weight management (BMI 45) and regular exercise should be initiated. - Recommend a download be obtained in 30 days and sleep clinic evaluation after 4 weeks of therapy.  [Electronically signed] 02/06/2016 03:27 PM  Shelva Majestic MD, [facility], [place], American Board of Sleep Medicine   NPI: [id number] [place] PH: [phone]   FX: [phone] [place]

## 2016-02-09 ENCOUNTER — Institutional Professional Consult (permissible substitution): Payer: Self-pay | Admitting: Pulmonary Disease

## 2016-02-15 MED FILL — OLMESARTAN-HCTZ 20-12.5 MG: 20-12.5 | 30 days supply | Qty: 30 | Fill #1

## 2016-02-15 MED FILL — CARVEDILOL 25 MG TABLET: 25 | 30 days supply | Qty: 60 | Fill #3

## 2016-02-15 MED FILL — XARELTO 20 MG TABLET: 20 | 30 days supply | Qty: 30 | Fill #1

## 2016-02-23 ENCOUNTER — Ambulatory Visit (INDEPENDENT_AMBULATORY_CARE_PROVIDER_SITE_OTHER): Payer: Managed Care, Other (non HMO) | Admitting: Family

## 2016-02-23 ENCOUNTER — Other Ambulatory Visit: Payer: Self-pay | Admitting: Family

## 2016-02-23 VITALS — BP 153/78 | HR 63

## 2016-02-23 DIAGNOSIS — I10 Essential (primary) hypertension: Secondary | ICD-10-CM | POA: Diagnosis not present

## 2016-02-23 MED ORDER — AMLODIPINE BESYLATE 10 MG PO TABS: 10.0000 mg | ORAL_TABLET | Freq: Every day | ORAL | 0 refills | Status: DC

## 2016-02-23 MED FILL — FLECAINIDE ACETATE 50 MG TA: 50 | 30 days supply | Qty: 90 | Fill #2

## 2016-02-23 MED FILL — AMLODIPINE BESYLATE 10 MG T: 10 | 30 days supply | Qty: 30 | Fill #0

## 2016-02-23 NOTE — Patient Instructions (Signed)
Per Jennifer Castle, NP: Increase Amlodipine to 10 mg once daily. Continue current regimen with all other medications. Return in 2 weeks for nurse visit to have blood pressure rechecked.

## 2016-02-23 NOTE — Progress Notes (Signed)
Pre visit review using our clinic review tool, if applicable. No additional management support is needed unless otherwise documented below in the visit note.  Patient presents in clinic for blood pressure check per Clinical Support note 01/26/16. Verified current medications & regimen. She reported improved cough and no other symptoms. Today's readings were: BP 151/91 P 65 & BP 153/78 P 63.  Per Inda Castle, NP: Increase Amlodipine to 10 mg once daily. Continue current regimen with all other medications. Return in 2 weeks for nurse visit to have blood pressure rechecked.  Patient was made aware of the provider's instructions and voiced understanding. Next appointment scheduled for 03/08/16 at 10:15 AM.

## 2016-02-23 NOTE — Progress Notes (Signed)
Noted  

## 2016-03-01 ENCOUNTER — Telehealth: Payer: Self-pay | Admitting: Cardiology

## 2016-03-01 ENCOUNTER — Telehealth: Payer: Self-pay | Admitting: *Deleted

## 2016-03-01 NOTE — Telephone Encounter (Signed)
She said you told her to call you back to get sleep study results.

## 2016-03-01 NOTE — Telephone Encounter (Signed)
Left message for patient to return a cal to discuss her sleep study results and recommendations.

## 2016-03-01 NOTE — Progress Notes (Signed)
Left message to return a call. 03/01/16.

## 2016-03-08 ENCOUNTER — Ambulatory Visit (INDEPENDENT_AMBULATORY_CARE_PROVIDER_SITE_OTHER): Payer: Managed Care, Other (non HMO) | Admitting: Family

## 2016-03-08 VITALS — BP 129/87 | HR 62

## 2016-03-08 DIAGNOSIS — I10 Essential (primary) hypertension: Secondary | ICD-10-CM

## 2016-03-08 NOTE — Progress Notes (Signed)
Noted and agree. 

## 2016-03-08 NOTE — Progress Notes (Signed)
Pre visit review using our clinic tool,if applicable. No additional management support is needed unless otherwise documented below in the visit note.   Patient in for BP check per order from M. O'ullivan. BP today is  129/87 Pulse is 63. Per M. Osullivan patient to continue taking medications a ordered an return for follow up appointment with her in 3 months. Patient voiced understanding.  Appointment scheduled for 06/07/16 with m. Edwena Blow

## 2016-03-13 ENCOUNTER — Encounter: Payer: Self-pay | Admitting: Pulmonary Disease

## 2016-03-13 ENCOUNTER — Ambulatory Visit (INDEPENDENT_AMBULATORY_CARE_PROVIDER_SITE_OTHER): Payer: Managed Care, Other (non HMO) | Admitting: Pulmonary Disease

## 2016-03-13 DIAGNOSIS — G4733 Obstructive sleep apnea (adult) (pediatric): Secondary | ICD-10-CM | POA: Diagnosis not present

## 2016-03-13 NOTE — Patient Instructions (Signed)
Prescription will be sent to DME for CPAP of 13 cm with a small nasal mask and humidity Download will be checked in 4 weeks  Call us for problems  Benefits of CPAP for your heart condition were discussed

## 2016-03-13 NOTE — Assessment & Plan Note (Signed)
Prescription will be sent to DME for CPAP of 13 cm with a small nasal mask and humidity Download will be checked in 4 weeks  Call us for problems  Benefits of CPAP for your heart condition were discussed   Weight loss encouraged, compliance with goal of at least 4-6 hrs every night is the expectation. Advised against medications with sedative side effects Cautioned against driving when sleepy - understanding that sleepiness will vary on a day to day basis

## 2016-03-13 NOTE — Progress Notes (Signed)
Subjective:    Patient ID: Jennifer [name], female    DOB: 22-Oct-1960, 56 y.o.   MRN: [id number]  HPI  56 year old obese hypertensive with initial fibrillation referred for evaluation of obstructive sleep apnea. She reports loud snoring and 2-3 nocturnal awakenings and non-refreshing sleep. Her kids can hear her snoring downstairs. She developed atrial fibrillation and was seen by EP and refer for sleep study PSG 01/2016 showed baseline portion of 141 minutes of sleep with AHI 26/hour, worse in rem sleep and lowest desaturation of 74%. This was corrected by CPAP of 13 cm and small fullface mask. She was able to tolerated well but is hesitant about starting CPAP therapy  Epworth sleepiness score is 6 Bedtime is between 9 and 11 PM, sleep latency is minimal, she sleeps on her side with 2 pillows, reports 2-3 nocturnal awakenings including nocturia and is out of bed by 5 AM feeling tired with dryness of mouth but denies headaches. She has cut down arterial coffee for the last 3 weeks, drinks 2-3 Cokes per day  There is no history suggestive of cataplexy, sleep paralysis or parasomnias  Atrial fibrillation is now controlled with amiodarone and anticoagulation   Past Medical History:  Diagnosis Date  . Allergy    allergic rhinitis  . Anemia 06/2007   C-scope 06/2007: tics, neg hemocults 07/2007  . Anxiety   . Chest pain 12/2007   negative ECHO, neg stress test 06/2008  . Condyloma acuminatum    history of  . Hyperglycemia    borderline. A1c 5.7  12/2007  . Hypertension    Past Surgical History:  Procedure Laterality Date  . ABDOMINAL HYSTERECTOMY     no oophorectomy  . BREAST SURGERY  age 56   Fibroid tumor removed right breast  . CARPAL TUNNEL RELEASE    . CESAREAN SECTION    . CHOLECYSTECTOMY      Allergies  Allergen Reactions  . Codeine Nausea And Vomiting    Social History   Social History  . Marital status: Married    Spouse name: N/A  . Number of children: 3    . Years of education: N/A   Occupational History  .  [employer]   Social History Main Topics  . Smoking status: Former Smoker    Packs/day: 0.50    Years: 27.00    Types: Cigarettes    Quit date: 02/12/2004  . Smokeless tobacco: Never Used  . Alcohol use No  . Drug use: No  . Sexual activity: Not on file   Other Topics Concern  . 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 Breast cancer Paternal Aunt   . Cervical cancer Cousin   . Diabetes Mother   . Coronary artery disease Father   . Kidney disease Father   . Diabetes Father   . Hypertension Father   . Prostate cancer Father   . Coronary artery disease Sister   . Kidney disease Sister   . Diabetes Sister   . Hypertension Sister   . Kidney disease Brother   . Stroke Brother   . Hypertension Brother  x3  . Diabetes Maternal Grandmother   . Stroke Maternal Grandmother   . Diabetes Paternal Grandmother   . Stroke Paternal Grandmother   . Coronary artery disease Other     grandmother, uncle  . Kidney disease Other     grandmother  . Stroke Other     uncle  . Thyroid disease Other     aunts  . Glaucoma Other     grandmother  . Hypertension Other     grandmother  . Diabetes Other     aunts  . Hypothyroidism Other     aunt  . Hyperthyroidism Other     aunt  . Healthy Child   . Healthy Child   . Healthy Child   . Colon cancer Neg Hx       Review of Systems  Positive for weight loss about 8 pounds intentional  Constitutional: negative for anorexia, fevers and sweats  Eyes: negative for irritation, redness and visual disturbance  Ears, nose, mouth, throat, and face: negative for earaches, epistaxis, nasal  congestion and sore throat  Respiratory: negative for cough, dyspnea on exertion, sputum and wheezing  Cardiovascular: negative for chest pain, dyspnea, lower extremity edema, orthopnea, palpitations and syncope  Gastrointestinal: negative for abdominal pain, constipation, diarrhea, melena, nausea and vomiting  Genitourinary:negative for dysuria, frequency and hematuria  Hematologic/lymphatic: negative for bleeding, easy bruising and lymphadenopathy  Musculoskeletal:negative for arthralgias, muscle weakness and stiff joints  Neurological: negative for coordination problems, gait problems, headaches and weakness  Endocrine: negative for diabetic symptoms including polydipsia, polyuria and weight loss     Objective:   Physical Exam   Gen. Pleasant, obese, in no distress ENT - no lesions, no post nasal drip Neck: No JVD, no thyromegaly, no carotid bruits Lungs: no use of accessory muscles, no dullness to percussion, decreased without rales or rhonchi  Cardiovascular: Rhythm regular, heart sounds  normal, no murmurs or gallops, no peripheral edema Musculoskeletal: No deformities, no cyanosis or clubbing , no tremors      Assessment & Plan:

## 2016-03-14 NOTE — Telephone Encounter (Signed)
Patient was seen yesterday by pulmonary in reference to her sleep study

## 2016-03-18 NOTE — Progress Notes (Signed)
Patient apparently seen by Pulmonary for OSA. Referred to Person.

## 2016-03-21 MED FILL — CARVEDILOL 25 MG TABLET: 25 | 30 days supply | Qty: 60 | Fill #4

## 2016-03-21 MED FILL — FLECAINIDE ACETATE 50 MG TA: 50 | 30 days supply | Qty: 90 | Fill #3

## 2016-03-21 MED FILL — OLMESARTAN-HCTZ 20-12.5 MG: 20-12.5 | 30 days supply | Qty: 30 | Fill #2

## 2016-03-21 MED FILL — XARELTO 20 MG TABLET: 20 | 30 days supply | Qty: 30 | Fill #2

## 2016-05-01 ENCOUNTER — Encounter: Payer: Self-pay | Admitting: Pulmonary Disease

## 2016-05-01 ENCOUNTER — Other Ambulatory Visit: Payer: Self-pay | Admitting: Family

## 2016-05-01 MED FILL — CARVEDILOL 25 MG TABLET: 25 | 30 days supply | Qty: 60 | Fill #5

## 2016-05-02 MED FILL — AMLODIPINE BESYLATE 10 MG T: 10 | 30 days supply | Qty: 30 | Fill #0

## 2016-05-02 MED FILL — OLMESARTAN-HCTZ 20-12.5 MG: 20-12.5 | 30 days supply | Qty: 30 | Fill #0

## 2016-05-03 ENCOUNTER — Encounter: Payer: Self-pay | Admitting: Pulmonary Disease

## 2016-05-03 ENCOUNTER — Ambulatory Visit (INDEPENDENT_AMBULATORY_CARE_PROVIDER_SITE_OTHER): Payer: Managed Care, Other (non HMO) | Admitting: Pulmonary Disease

## 2016-05-03 DIAGNOSIS — G4733 Obstructive sleep apnea (adult) (pediatric): Secondary | ICD-10-CM | POA: Diagnosis not present

## 2016-05-03 DIAGNOSIS — I480 Paroxysmal atrial fibrillation: Secondary | ICD-10-CM | POA: Diagnosis not present

## 2016-05-03 MED FILL — XARELTO 20 MG TABLET: 20 | 30 days supply | Qty: 30 | Fill #3

## 2016-05-03 NOTE — Assessment & Plan Note (Signed)
Benefits of CPAP for atrial fibrillation were discussed 

## 2016-05-03 NOTE — Progress Notes (Signed)
   Subjective:    Patient ID: Jennifer [name], female    DOB: 1961/01/14, 56 y.o.   MRN: [id number]  HPI  56 year old obese hypertensive with atrial fibrillation  For FU of obstructive sleep apnea. Atrial fibrillation is controlled with amiodarone and anticoagulation  She has adjusted well to CPAP machine that was started on month ago. She wonders if she can have had less cumbersome mask than a full face mask. She is tolerating pressure well, denies nasal stuffiness or dryness. She has noticed improvement in her daytime somnolence and fatigue. Download confirms excellent compliance more than 6 hours every night with good control of events and 13 cm with minimal leak  She has been unable to lose weight. She denies palpitations and wonders if she can get off some of her cardiac medications  Significant tests/ events  PSG 01/2016 showed baseline portion of 141 minutes of sleep with AHI 26/hour, worse in rem sleep and lowest desaturation of 74%. This was corrected by CPAP of 13 cm and small fullface mask.     Past Medical History:  Diagnosis Date  . Allergy    allergic rhinitis  . Anemia 06/2007   C-scope 06/2007: tics, neg hemocults 07/2007  . Anxiety   . Chest pain 12/2007   negative ECHO, neg stress test 06/2008  . Condyloma acuminatum    history of  . Hyperglycemia    borderline. A1c 5.7  12/2007  . Hypertension      Review of Systems neg for any significant sore throat, dysphagia, itching, sneezing, nasal congestion or excess/ purulent secretions, fever, chills, sweats, unintended wt loss, pleuritic or exertional cp, hempoptysis, orthopnea pnd or change in chronic leg swelling. Also denies presyncope, palpitations, heartburn, abdominal pain, nausea, vomiting, diarrhea or change in bowel or urinary habits, dysuria,hematuria, rash, arthralgias, visual complaints, headache, numbness weakness or ataxia.     Objective:   Physical Exam  Gen. Pleasant, obese, in no distress ENT -  no lesions, no post nasal drip Neck: No JVD, no thyromegaly, no carotid bruits Lungs: no use of accessory muscles, no dullness to percussion, decreased without rales or rhonchi  Cardiovascular: Rhythm regular, heart sounds  normal, no murmurs or gallops, no peripheral edema Musculoskeletal: No deformities, no cyanosis or clubbing , no tremors       Assessment & Plan:

## 2016-05-03 NOTE — Patient Instructions (Signed)
Changed to auto settings 8-13 cm with nasal pillows Repeat download in one month

## 2016-05-03 NOTE — Assessment & Plan Note (Signed)
Changed to auto settings 8-13 cm with nasal pillows Repeat download in one month  Weight loss encouraged, compliance with goal of at least 4-6 hrs every night is the expectation. Advised against medications with sedative side effects Cautioned against driving when sleepy - understanding that sleepiness will vary on a day to day basis

## 2016-05-03 NOTE — Addendum Note (Signed)
Addended by: Valerie Salts on: 05/03/2016 12:19 PM   Modules accepted: Orders

## 2016-05-16 MED FILL — PENICILLIN VK 500 MG TABLET: 500 | 7 days supply | Qty: 29 | Fill #0

## 2016-05-22 ENCOUNTER — Ambulatory Visit (INDEPENDENT_AMBULATORY_CARE_PROVIDER_SITE_OTHER): Payer: Managed Care, Other (non HMO) | Admitting: Cardiology

## 2016-05-22 ENCOUNTER — Encounter: Payer: Self-pay | Admitting: Cardiology

## 2016-05-22 VITALS — BP 136/98 | HR 80 | Ht 62.0 in | Wt 257.6 lb

## 2016-05-22 DIAGNOSIS — I480 Paroxysmal atrial fibrillation: Secondary | ICD-10-CM | POA: Diagnosis not present

## 2016-05-22 MED ORDER — OLMESARTAN MEDOXOMIL-HCTZ 20-12.5 MG PO TABS: 1.0000 | ORAL_TABLET | Freq: Every day | ORAL | 2 refills | Status: DC

## 2016-05-22 NOTE — Progress Notes (Signed)
Electrophysiology Office Note   Date:  03/07/5807   ID:  Jennifer [name], DOB 1960/07/04, MRN [id number]  PCP:  Nance Pear., NP  Primary Electrophysiologist:  Constance Haw, MD    Chief Complaint  Patient presents with  . Follow-up    PAF     History of Present Illness: Jennifer [name] is a 56 y.o. female who presents today for electrophysiology evaluation.   Hx HTN, HLD presented to PCP with jitteriness in chest. Found to have atrial fibrillation.    Today, she denies symptoms of palpitations, chest pain, shortness of breath, orthopnea, PND, lower extremity edema, claudication, dizziness, presyncope, syncope, bleeding, or neurologic sequela. The patient is tolerating medications without difficulties. Was started on flecainide at her last visit. Is also on Xarelto. She's been in sinus rhythm since starting flecainide. She has been having trouble affording the CPAP. Apparently her monthly co-pay is $150.   Past Medical History:  Diagnosis Date  . Allergy    allergic rhinitis  . Anemia 06/2007   C-scope 06/2007: tics, neg hemocults 07/2007  . Anxiety   . Chest pain 12/2007   negative ECHO, neg stress test 06/2008  . Condyloma acuminatum    history of  . Hyperglycemia    borderline. A1c 5.7  12/2007  . Hypertension    Past Surgical History:  Procedure Laterality Date  . ABDOMINAL HYSTERECTOMY     no oophorectomy  . BREAST SURGERY  age 56   Fibroid tumor removed right breast  . CARPAL TUNNEL RELEASE    . CESAREAN SECTION    . CHOLECYSTECTOMY       Current Outpatient Prescriptions  Medication Sig Dispense Refill  . amLODipine (NORVASC) 10 MG tablet TAKE 1 TABLET (10 MG TOTAL) BY MOUTH DAILY. 30 tablet 0  . carvedilol (COREG) 25 MG tablet TAKE 1 TABLET (25 MG) BY MOUTH 2 TIMES A DAY WITH A MEAL. 180 tablet 1  . flecainide (TAMBOCOR) 50 MG tablet Take 1.5 tablets (75 mg total) by mouth 2 (two) times daily. 90 tablet 3  . Multiple Vitamin (MULTIVITAMIN  WITH MINERALS) TABS tablet Take 1 tablet by mouth at bedtime. ALIVE multi-vitamin    . olmesartan-hydrochlorothiazide (BENICAR HCT) 20-12.5 MG tablet Take 1 tablet by mouth daily. 30 tablet 2  . rivaroxaban (XARELTO) 20 MG TABS tablet Take 1 tablet (20 mg total) by mouth daily with supper. 30 tablet 3   No current facility-administered medications for this visit.     Allergies:   Codeine   Social History:  The patient  reports that she quit smoking about 12 years ago. Her smoking use included Cigarettes. She has a 13.50 pack-year smoking history. She has never used smokeless tobacco. She reports that she does not drink alcohol or use drugs.   Family History:  The patient's family history includes Breast cancer in her paternal aunt; Cervical cancer in her cousin; Coronary artery disease in her father, other, and sister; Diabetes in her father, maternal grandmother, mother, other, paternal grandmother, and sister; Glaucoma in her other; Healthy in her child, child, and child; Hypertension in her brother, father, other, and sister; Hyperthyroidism in her other; Hypothyroidism in her other; Kidney disease in her brother, father, other, and sister; Prostate cancer in her father; Stroke in her brother, maternal grandmother, other, and paternal grandmother; Thyroid disease in her other.    ROS:  Please see the history of present illness.   Otherwise, review of systems is positive for none.   All  other systems are reviewed and negative.    PHYSICAL EXAM: VS:  BP (!) 136/98   Pulse 80   Ht 5\' 2"  (1.575 m)   Wt 257 lb 9.6 oz (116.8 kg)   BMI 47.12 kg/m  , BMI Body mass index is 47.12 kg/m. GEN: Well nourished, well developed, in no acute distress  HEENT: normal  Neck: no JVD, carotid bruits, or masses Cardiac: RRR; no murmurs, rubs, or gallops,no edema  Respiratory:  clear to auscultation bilaterally, normal work of breathing GI: soft, nontender, nondistended, + BS MS: no deformity or atrophy    Skin: warm and dry Neuro:  Strength and sensation are intact Psych: euthymic mood, full affect  EKG:  EKG is not ordered today. Personal review of the ekg ordered shows Sinus rhythm, rate 80  Recent Labs: 10/08/2015: Hemoglobin 12.8; Platelets 310 10/20/2015: TSH 1.77 01/26/2016: BUN 14; Creatinine, Ser 0.79; Potassium 4.1; Sodium 142    Lipid Panel     Component Value Date/Time   CHOL 180 03/10/2015 0830   TRIG 42.0 03/10/2015 0830   HDL 51.90 03/10/2015 0830   CHOLHDL 3 03/10/2015 0830   VLDL 8.4 03/10/2015 0830   LDLCALC 120 (H) 03/10/2015 0830     Wt Readings from Last 3 Encounters:  05/22/16 257 lb 9.6 oz (116.8 kg)  05/03/16 252 lb 9.6 oz (114.6 kg)  03/13/16 250 lb (113.4 kg)      Other studies Reviewed: Additional studies/ records that were reviewed today include: PCP notes   ASSESSMENT AND PLAN:  1.  Paroxysmal atrial fibrillation: On flecainide and Xarelto. Recently diagnosed with OSA on CPAP. She is in sinus rhythm and tolerating the medications appropriately. Continue current management.  This patients CHA2DS2-VASc Score and unadjusted Ischemic Stroke Rate (% per year) is equal to 2.2 % stroke rate/year from a score of 2  Above score calculated as 1 point each if present [CHF, HTN, DM, Vascular=MI/PAD/Aortic Plaque, Age if 65-74, or Female] Above score calculated as 2 points each if present [Age > 75, or Stroke/TIA/TE]   2. Hypertension: Elevated today on Coreg, Benicar, and HCTZ. Was apparently normal yesterday. No changes necessary.  3. OSA: encouraged weight loss and CPAP compliance. She is having difficulty affording her CPAP. Davius Goudeau have her discuss this with her pulmonologist.  Current medicines are reviewed at length with the patient today.   The patient does not have concerns regarding her medicines.  The following changes were made today:  None  Labs/ tests ordered today include:  Orders Placed This Encounter  Procedures  . EKG 12-Lead      Disposition:   FU with Amberlee Garvey 6 months  Signed, Zianna Dercole Meredith Leeds, MD  05/22/2016 10:42 AM     [facility] [address] [place] [address] [phone] (office) [phone] (fax)

## 2016-05-22 NOTE — Patient Instructions (Addendum)
Medication Instructions:  Your physician recommends that you continue on your current medications as directed. Please refer to the Current Medication list given to you today.  If you need a refill on your cardiac medications before your next appointment, please call your pharmacy.   Labwork: None ordered  Testing/Procedures: None ordered  Follow-Up: Your physician wants you to follow-up in: 6 months with Dr. Camnitz.  You will receive a reminder letter in the mail two months in advance. If you don't receive a letter, please call our office to schedule the follow-up appointment.  Thank you for choosing [facility]!!   Devery Murgia, RN [phone]         

## 2016-05-24 ENCOUNTER — Telehealth: Payer: Self-pay

## 2016-05-24 NOTE — Telephone Encounter (Signed)
-----   Message from Rigoberto Noel, MD sent at 05/24/2016  9:24 AM EDT ----- Will, Thanks for letting us know.  Britne Borelli,  Can you pl call her and find out from DME if we can help her out ?  RA  ----- Message ----- From: Constance Haw, MD Sent: 05/22/2016  10:44 AM To: Rigoberto Noel, MD  Mutual patient of ours. She is having trouble affording her CPAP with a $150 copay. Is there anything that you know of to make this more affordable? Thanks!  Will Camnitz

## 2016-05-31 ENCOUNTER — Other Ambulatory Visit: Payer: Self-pay | Admitting: Cardiology

## 2016-05-31 MED FILL — XARELTO 20 MG TABLET: 20 | 30 days supply | Qty: 30 | Fill #1

## 2016-05-31 MED FILL — OLMESARTAN-HCTZ 20-12.5 MG: 20-12.5 | 30 days supply | Qty: 30 | Fill #1

## 2016-06-03 ENCOUNTER — Encounter: Payer: Self-pay | Admitting: Family

## 2016-06-03 ENCOUNTER — Ambulatory Visit (INDEPENDENT_AMBULATORY_CARE_PROVIDER_SITE_OTHER): Payer: Managed Care, Other (non HMO) | Admitting: Family

## 2016-06-03 VITALS — BP 153/83 | HR 64 | Temp 98.3°F | Resp 20 | Ht 62.0 in | Wt 255.0 lb

## 2016-06-03 DIAGNOSIS — M25562 Pain in left knee: Secondary | ICD-10-CM | POA: Diagnosis not present

## 2016-06-03 DIAGNOSIS — M62830 Muscle spasm of back: Secondary | ICD-10-CM

## 2016-06-03 DIAGNOSIS — I10 Essential (primary) hypertension: Secondary | ICD-10-CM

## 2016-06-03 MED ORDER — AMLODIPINE BESYLATE 10 MG PO TABS: 10.0000 mg | ORAL_TABLET | Freq: Every day | ORAL | 5 refills | Status: DC

## 2016-06-03 MED ORDER — CARVEDILOL 25 MG PO TABS: ORAL_TABLET | ORAL | 1 refills | Status: DC

## 2016-06-03 MED ORDER — CYCLOBENZAPRINE HCL 5 MG PO TABS: 5.0000 mg | ORAL_TABLET | Freq: Three times a day (TID) | ORAL | 0 refills | Status: DC | PRN

## 2016-06-03 NOTE — Assessment & Plan Note (Signed)
Uncontrolled. Restart amlodipine. I advised patient that I do not think that benicar is causing her vision change. She was on tribenzor since 2013 without difficulty. I advised her to schedule an eye exam.

## 2016-06-03 NOTE — Patient Instructions (Addendum)
Restart flecanide and amlodipine. Start meloxicam for the pain behind your knee.  Add tylenol as needed for knee pain- ice knee twice daily.   For back pain- begin flexeril as needed- short term. May cause drowsiness so do not drive after taking. You may also use tylenol as needed for back pain.

## 2016-06-03 NOTE — Progress Notes (Signed)
Subjective:    Patient ID: Jennifer [name], female    DOB: 1960/04/24, 56 y.o.   MRN: [id number]  HPI   Jennifer [name] is a 56 yr old female who presents today for follow up.    HTN- ran out of amlodipine. Feels like benicar-hct is causing her vision to change- reports worsening distance vision.  BP Readings from Last 3 Encounters:  06/03/16 (!) 153/83  05/22/16 (!) 136/98  05/03/16 122/88   Swelling- notes swelling behind the left knee x 1 week. She denies injury.    Low back spasm- left sided low back- began this AM.     Review of Systems See HPI  Past Medical History:  Diagnosis Date  . Allergy    allergic rhinitis  . Anemia 06/2007   C-scope 06/2007: tics, neg hemocults 07/2007  . Anxiety   . Chest pain 12/2007   negative ECHO, neg stress test 06/2008  . Condyloma acuminatum    history of  . Hyperglycemia    borderline. A1c 5.7  12/2007  . Hypertension      Social History   Social History  . Marital status: Divorced    Spouse name: N/A  . Number of children: 3  . Years of education: N/A   Occupational History  .  [employer]   Social History Main Topics  . Smoking status: Former Smoker    Packs/day: 0.50    Years: 27.00    Types: Cigarettes    Quit date: 02/12/2004  . Smokeless tobacco: Never Used  . Alcohol use No  . Drug use: No  . Sexual activity: Not on file   Other Topics Concern  . 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 ABDOMINAL HYSTERECTOMY     no oophorectomy  . BREAST SURGERY  age 71   Fibroid tumor removed right breast  . CARPAL TUNNEL RELEASE    . CESAREAN SECTION    . CHOLECYSTECTOMY      Family History  Problem Relation Age of Onset    . Breast cancer Paternal Aunt   . Cervical cancer Cousin   . Diabetes Mother   . Coronary artery disease Father   . Kidney disease Father   . Diabetes Father   . Hypertension Father   . Prostate cancer Father   . Coronary artery disease Sister   . Kidney disease Sister   . Diabetes Sister   . Hypertension Sister   . Kidney disease Brother   . Stroke Brother   . Hypertension Brother     x3  . Diabetes Maternal Grandmother   . Stroke Maternal Grandmother   . Diabetes Paternal Grandmother   . Stroke Paternal Grandmother   . Coronary artery disease Other     grandmother, uncle  . Kidney disease Other     grandmother  . Stroke Other     uncle  . Thyroid disease Other     aunts  . Glaucoma Other     grandmother  . Hypertension Other     grandmother  . Diabetes Other     aunts  . Hypothyroidism Other  aunt  . Hyperthyroidism Other     aunt  . Healthy Child   . Healthy Child   . Healthy Child   . Colon cancer Neg Hx     Allergies  Allergen Reactions  . Codeine Nausea And Vomiting    Current Outpatient Prescriptions on File Prior to Visit  Medication Sig Dispense Refill  . amLODipine (NORVASC) 10 MG tablet TAKE 1 TABLET (10 MG TOTAL) BY MOUTH DAILY. 30 tablet 0  . carvedilol (COREG) 25 MG tablet TAKE 1 TABLET (25 MG) BY MOUTH 2 TIMES A DAY WITH A MEAL. 180 tablet 1  . flecainide (TAMBOCOR) 50 MG tablet TAKE 1&1/2 TABLETS (75 MG TOTAL) BY MOUTH 2 (TWO) TIMES DAILY. 90 tablet 3  . Multiple Vitamin (MULTIVITAMIN WITH MINERALS) TABS tablet Take 1 tablet by mouth at bedtime. ALIVE multi-vitamin    . olmesartan-hydrochlorothiazide (BENICAR HCT) 20-12.5 MG tablet Take 1 tablet by mouth daily. 30 tablet 2  . rivaroxaban (XARELTO) 20 MG TABS tablet Take 1 tablet (20 mg total) by mouth daily with supper. 30 tablet 3   No current facility-administered medications on file prior to visit.     BP (!) 153/83 (BP Location: Right Arm, Cuff Size: Large)   Pulse 64   Temp 98.3  F (36.8 C) (Oral)   Resp 20   Ht 5\' 2"  (1.575 m)   Wt 255 lb (115.7 kg)   SpO2 99% Comment: room air  BMI 46.64 kg/m       Objective:   Physical Exam  Constitutional: She is oriented to person, place, and time. She appears well-developed and well-nourished.  HENT:  Head: Normocephalic and atraumatic.  Cardiovascular: Normal rate, regular rhythm and normal heart sounds.   No murmur heard. Pulmonary/Chest: Effort normal and breath sounds normal. No respiratory distress. She has no wheezes.  Musculoskeletal: She exhibits no edema.  Some pain with flexion of left knee. Mild left posterior knee swelling noted.   Mild left low back tenderness to palpation  Neurological: She is alert and oriented to person, place, and time.  Psychiatric: She has a normal mood and affect. Her behavior is normal. Judgment and thought content normal.          Assessment & Plan:  Knee pain- advised icing. Tylenol. Avoid NSAID due to xarelto. If no improvement, consider referral to orthopedics.   Low back spasm- trial of heating pad, tylenol, flexeril. Avoid NSAID due to xarelto.

## 2016-06-07 ENCOUNTER — Ambulatory Visit: Payer: Self-pay | Admitting: Family

## 2016-06-14 ENCOUNTER — Emergency Department (HOSPITAL_COMMUNITY)
Admission: EM | Admit: 2016-06-14 | Discharge: 2016-06-14 | Disposition: A | Discharge status: Home / Self Care | Payer: Managed Care, Other (non HMO) | Attending: Dermatology | Admitting: Dermatology

## 2016-06-14 ENCOUNTER — Encounter (HOSPITAL_COMMUNITY): Payer: Self-pay | Admitting: Emergency Medicine

## 2016-06-14 DIAGNOSIS — I10 Essential (primary) hypertension: Secondary | ICD-10-CM | POA: Diagnosis not present

## 2016-06-14 DIAGNOSIS — Z87891 Personal history of nicotine dependence: Secondary | ICD-10-CM | POA: Diagnosis not present

## 2016-06-14 DIAGNOSIS — Z5321 Procedure and treatment not carried out due to patient leaving prior to being seen by health care provider: Secondary | ICD-10-CM | POA: Insufficient documentation

## 2016-06-14 DIAGNOSIS — R51 Headache: Secondary | ICD-10-CM | POA: Diagnosis present

## 2016-06-14 NOTE — ED Notes (Signed)
Pt states she feels better and is going home; RN seen pt leave Ed with family

## 2016-06-14 NOTE — ED Triage Notes (Signed)
Pt st's she was asleep tonight and was woken up with a sharp pain in left side of her head.  Pt denies any other symptoms.  Pt st's pain has subsided at this time

## 2016-06-20 MED FILL — CARVEDILOL 25 MG TABLET: 25 | 30 days supply | Qty: 60 | Fill #0

## 2016-06-27 ENCOUNTER — Encounter: Payer: Self-pay | Admitting: Pulmonary Disease

## 2016-06-28 ENCOUNTER — Ambulatory Visit: Payer: Managed Care, Other (non HMO) | Admitting: Family

## 2016-06-28 ENCOUNTER — Encounter: Payer: Self-pay | Admitting: Pulmonary Disease

## 2016-06-28 ENCOUNTER — Ambulatory Visit (INDEPENDENT_AMBULATORY_CARE_PROVIDER_SITE_OTHER): Payer: Managed Care, Other (non HMO) | Admitting: Pulmonary Disease

## 2016-06-28 DIAGNOSIS — Z6835 Body mass index (BMI) 35.0-35.9, adult: Secondary | ICD-10-CM | POA: Diagnosis not present

## 2016-06-28 DIAGNOSIS — G4733 Obstructive sleep apnea (adult) (pediatric): Secondary | ICD-10-CM

## 2016-06-28 NOTE — Assessment & Plan Note (Signed)
on auto CPAP settings. CPAP is working well. CPAP supplies will be renewed for a year  Weight loss encouraged, compliance with goal of at least 4-6 hrs every night is the expectation. Advised against medications with sedative side effects Cautioned against driving when sleepy - understanding that sleepiness will vary on a day to day basis

## 2016-06-28 NOTE — Assessment & Plan Note (Signed)
Weight loss advised 

## 2016-06-28 NOTE — Progress Notes (Signed)
   Subjective:    Patient ID: Jennifer [name], female    DOB: June 04, 1960, 56 y.o.   MRN: [id number]  HPI  56 year old obese hypertensive with atrial fibrillation  For FU of obstructive sleep apnea. Atrial fibrillation is controlled with coreg, flecainide and anticoagulation  She was started on CPAP February 2018 and is slowly adjusting to the fullface mask. Usage is improving she has only one missed night in the last month. The problems with mask or pressure she is able to tolerate the pressure better. She has noted improvement in her daytime somnolence and fatigue. Download on auto settings 8-13 cm shows good control of events with no residuals and minimal leak and good usage  Significant tests/ events  PSG 01/2016 showed baseline portion of 141 minutes of sleep with AHI 26/hour, worse in rem sleep and lowest desaturation of 74%. >> CPAP of 13 cm and small fullface mask.    Review of Systems Patient denies significant dyspnea,cough, hemoptysis,  chest pain, palpitations, pedal edema, orthopnea, paroxysmal nocturnal dyspnea, lightheadedness, nausea, vomiting, abdominal or  leg pains      Objective:   Physical Exam  Gen. Pleasant, obese, in no distress ENT - no lesions, no post nasal drip Neck: No JVD, no thyromegaly, no carotid bruits Lungs: no use of accessory muscles, no dullness to percussion, decreased without rales or rhonchi  Cardiovascular: Rhythm regular, heart sounds  normal, no murmurs or gallops, no peripheral edema Musculoskeletal: No deformities, no cyanosis or clubbing , no tremors       Assessment & Plan:

## 2016-06-28 NOTE — Patient Instructions (Signed)
You are on auto CPAP settings. CPAP is working well. CPAP supplies will be renewed for a year

## 2016-12-19 ENCOUNTER — Ambulatory Visit: Payer: Managed Care, Other (non HMO) | Admitting: Nurse Practitioner

## 2016-12-19 ENCOUNTER — Encounter: Payer: Self-pay | Admitting: Nurse Practitioner

## 2016-12-19 ENCOUNTER — Telehealth: Payer: Self-pay | Admitting: Nurse Practitioner

## 2016-12-19 VITALS — BP 168/106 | HR 89 | Temp 98.4°F | Ht 62.0 in | Wt 246.0 lb

## 2016-12-19 DIAGNOSIS — I10 Essential (primary) hypertension: Secondary | ICD-10-CM

## 2016-12-19 DIAGNOSIS — G43C1 Periodic headache syndromes in child or adult, intractable: Secondary | ICD-10-CM | POA: Diagnosis not present

## 2016-12-19 MED ORDER — AMLODIPINE BESYLATE 10 MG PO TABS: 10.0000 mg | ORAL_TABLET | Freq: Every day | ORAL | 0 refills | Status: DC

## 2016-12-19 MED ORDER — CARVEDILOL 25 MG PO TABS: ORAL_TABLET | ORAL | 0 refills | Status: DC

## 2016-12-19 MED ORDER — ASPIRIN-ACETAMINOPHEN-CAFFEINE 250-250-65 MG PO TABS: 1.0000 | ORAL_TABLET | Freq: Three times a day (TID) | ORAL | 0 refills | Status: DC | PRN

## 2016-12-19 MED FILL — AMLODIPINE BESYLATE 10 MG T: 10 | 30 days supply | Qty: 30 | Fill #0

## 2016-12-19 MED FILL — CARVEDILOL 25 MG TABS: 25 | 30 days supply | Qty: 60 | Fill #0

## 2016-12-19 NOTE — Telephone Encounter (Signed)
Pt would like her med sent in today resent to [facility]

## 2016-12-19 NOTE — Patient Instructions (Signed)
[place]

## 2016-12-19 NOTE — Progress Notes (Signed)
Subjective:  Patient ID: Jennifer [name], female    DOB: April 29, 1960  Age: 56 y.o. MRN: [id number]  CC: Headache (headache--goingon for a little bet--high BP going on for 1 mo 164/104 today reading)   HPI HTN: Uncontrolled. Stop taking medications 06/2016 because she changed eating habits and magnesium supplement. She reports BP was 130/80s She stopped diet 11/2016 and has noticed steady increase in BP. BP Readings from Last 3 Encounters:  12/19/16 (!) 168/106  06/28/16 120/80  06/03/16 (!) 153/83   Headache: Intermittent  Since last week. No OTC used.  Outpatient Medications Prior to Visit  Medication Sig Dispense Refill  . flecainide (TAMBOCOR) 50 MG tablet TAKE 1&1/2 TABLETS (75 MG TOTAL) BY MOUTH 2 (TWO) TIMES DAILY. (Patient not taking: Reported on 12/19/2016) 90 tablet 3  . Multiple Vitamin (MULTIVITAMIN WITH MINERALS) TABS tablet Take 1 tablet by mouth at bedtime. ALIVE multi-vitamin    . olmesartan-hydrochlorothiazide (BENICAR HCT) 20-12.5 MG tablet Take 1 tablet by mouth daily. (Patient not taking: Reported on 12/19/2016) 30 tablet 2  . amLODipine (NORVASC) 10 MG tablet Take 1 tablet (10 mg total) by mouth daily. (Patient not taking: Reported on 12/19/2016) 30 tablet 5  . carvedilol (COREG) 25 MG tablet TAKE 1 TABLET (25 MG) BY MOUTH 2 TIMES A DAY WITH A MEAL. (Patient not taking: Reported on 12/19/2016) 180 tablet 1  . rivaroxaban (XARELTO) 20 MG TABS tablet Take 1 tablet (20 mg total) by mouth daily with supper. (Patient not taking: Reported on 12/19/2016) 30 tablet 3   No facility-administered medications prior to visit.     ROS See HPI  Objective:  BP (!) 168/106   Pulse 89   Temp 98.4 F (36.9 C)   Ht 5\' 2"  (1.575 m)   Wt 246 lb (111.6 kg)   SpO2 97%   BMI 44.99 kg/m   BP Readings from Last 3 Encounters:  12/19/16 (!) 168/106  06/28/16 120/80  06/03/16 (!) 153/83    Wt Readings from Last 3 Encounters:  12/19/16 246 lb (111.6 kg)  06/28/16 253 lb 3.2  oz (114.9 kg)  06/03/16 255 lb (115.7 kg)    Physical Exam  Constitutional: She is oriented to person, place, and time. No distress.  Cardiovascular: Normal rate and regular rhythm.  Pulmonary/Chest: Effort normal and breath sounds normal.  Musculoskeletal: She exhibits no edema.  Neurological: She is alert and oriented to person, place, and time.  Vitals reviewed.   Lab Results  Component Value Date   WBC 5.8 10/08/2015   HGB 12.8 10/08/2015   HCT 40.0 10/08/2015   PLT 310 10/08/2015   GLUCOSE 90 01/26/2016   CHOL 180 03/10/2015   TRIG 42.0 03/10/2015   HDL 51.90 03/10/2015   LDLCALC 120 (H) 03/10/2015   ALT 13 03/10/2015   AST 16 03/10/2015   NA 142 01/26/2016   K 4.1 01/26/2016   CL 102 01/26/2016   CREATININE 0.79 01/26/2016   BUN 14 01/26/2016   CO2 31 01/26/2016   TSH 1.77 10/20/2015   INR 1.0 12/11/2007   HGBA1C 5.9 10/20/2015    Mm Screening Breast Tomo Bilateral  Result Date: 01/27/2016 CLINICAL DATA:  Screening. EXAM: 2D DIGITAL SCREENING BILATERAL MAMMOGRAM WITH CAD AND ADJUNCT TOMO COMPARISON:  Previous exam(s). ACR Breast Density Category c: The breast tissue is heterogeneously dense, which may obscure small masses. FINDINGS: There are no findings suspicious for malignancy. Images were processed with CAD. IMPRESSION: No mammographic evidence of malignancy. A result letter of this  screening mammogram will be mailed directly to the patient. RECOMMENDATION: Screening mammogram in one year. (Code:[id number]-B-01Y) BI-RADS CATEGORY  1: Negative. Electronically Signed   By: Dorise Bullion III M.D   On: 01/29/2016 18:52    Assessment & Plan:   Canaan was seen today for headache.  Diagnoses and all orders for this visit:  Intractable periodic headache syndrome -     Discontinue: aspirin-acetaminophen-caffeine (EXCEDRIN EXTRA STRENGTH) 250-250-65 MG tablet; Take 1 tablet every 8 (eight) hours as needed by mouth for headache.  Essential hypertension -      Discontinue: amLODipine (NORVASC) 10 MG tablet; Take 1 tablet (10 mg total) at bedtime by mouth. -     Discontinue: carvedilol (COREG) 25 MG tablet; TAKE 1 TABLET (25 MG) BY MOUTH 2 TIMES A DAY WITH A MEAL.   I have discontinued Jacqualynn A. Stills's amLODipine and carvedilol. I am also having her maintain her multivitamin with minerals, olmesartan-hydrochlorothiazide, and flecainide.  Meds ordered this encounter  Medications  . DISCONTD: amLODipine (NORVASC) 10 MG tablet    Sig: Take 1 tablet (10 mg total) at bedtime by mouth.    Dispense:  30 tablet    Refill:  0    Order Specific Question:   Supervising Provider    Answer:   Cassandria Anger [[id number]]  . DISCONTD: carvedilol (COREG) 25 MG tablet    Sig: TAKE 1 TABLET (25 MG) BY MOUTH 2 TIMES A DAY WITH A MEAL.    Dispense:  60 tablet    Refill:  0    Order Specific Question:   Supervising Provider    Answer:   Cassandria Anger [[id number]]  . DISCONTD: aspirin-acetaminophen-caffeine (EXCEDRIN EXTRA STRENGTH) [phone] MG tablet    Sig: Take 1 tablet every 8 (eight) hours as needed by mouth for headache.    Dispense:  30 tablet    Refill:  0    Order Specific Question:   Supervising Provider    Answer:   Cassandria Anger [[id number]]    Follow-up: Return in about 2 weeks (around 01/02/2017) for HTN at [facility] location.  Wilfred Lacy, NP

## 2016-12-20 ENCOUNTER — Other Ambulatory Visit: Payer: Self-pay | Admitting: Family

## 2016-12-20 DIAGNOSIS — Z1231 Encounter for screening mammogram for malignant neoplasm of breast: Secondary | ICD-10-CM

## 2016-12-22 ENCOUNTER — Encounter: Payer: Self-pay | Admitting: Nurse Practitioner

## 2017-01-06 ENCOUNTER — Ambulatory Visit: Payer: Managed Care, Other (non HMO) | Admitting: Nurse Practitioner

## 2017-01-06 ENCOUNTER — Encounter: Payer: Self-pay | Admitting: Nurse Practitioner

## 2017-01-06 VITALS — BP 150/96 | HR 72 | Temp 98.0°F | Ht 62.0 in | Wt 243.0 lb

## 2017-01-06 DIAGNOSIS — I10 Essential (primary) hypertension: Secondary | ICD-10-CM

## 2017-01-06 DIAGNOSIS — R1031 Right lower quadrant pain: Secondary | ICD-10-CM

## 2017-01-06 LAB — CBC
HCT: 37.7 % (ref 36.0–46.0)
Hemoglobin: 12.2 g/dL (ref 12.0–15.0)
MCHC: 32.3 g/dL (ref 30.0–36.0)
MCV: 90 fl (ref 78.0–100.0)
Platelets: 302 10*3/uL (ref 150.0–400.0)
RBC: 4.19 Mil/uL (ref 3.87–5.11)
RDW: 13.8 % (ref 11.5–15.5)
WBC: 4.8 10*3/uL (ref 4.0–10.5)

## 2017-01-06 LAB — BASIC METABOLIC PANEL
BUN: 15 mg/dL (ref 6–23)
CO2: 30 mEq/L (ref 19–32)
CREATININE: 0.74 mg/dL (ref 0.40–1.20)
Calcium: 9.5 mg/dL (ref 8.4–10.5)
Chloride: 100 mEq/L (ref 96–112)
GFR: 103.64 mL/min (ref >60.00)
Glucose, Bld: 109 mg/dL — ABNORMAL HIGH (ref 70–99)
POTASSIUM: 4 meq/L (ref 3.5–5.1)
Sodium: 137 mEq/L (ref 135–145)

## 2017-01-06 LAB — HEPATIC FUNCTION PANEL
ALK PHOS: 72 U/L (ref 39–117)
ALT: 13 U/L (ref 0–35)
AST: 14 U/L (ref 0–37)
Albumin: 4 g/dL (ref 3.5–5.2)
BILIRUBIN TOTAL: 0.6 mg/dL (ref 0.2–1.2)
Bilirubin, Direct: 0.1 mg/dL (ref 0.0–0.3)
Total Protein: 7.2 g/dL (ref 6.0–8.3)

## 2017-01-06 MED ORDER — AMLODIPINE BESYLATE 10 MG PO TABS: 10.0000 mg | ORAL_TABLET | Freq: Every day | ORAL | 1 refills | Status: DC

## 2017-01-06 MED ORDER — CARVEDILOL 25 MG PO TABS: ORAL_TABLET | ORAL | 1 refills | Status: DC

## 2017-01-06 NOTE — Patient Instructions (Addendum)
Normal lab results. Proceed to schedule CT ABD/pelvis to rule out colitis.  DASH Eating Plan DASH stands for "Dietary Approaches to Stop Hypertension." The DASH eating plan is a healthy eating plan that has been shown to reduce high blood pressure (hypertension). It may also reduce your risk for type 2 diabetes, heart disease, and stroke. The DASH eating plan may also help with weight loss. What are tips for following this plan? General guidelines  Avoid eating more than 2,300 mg (milligrams) of salt (sodium) a day. If you have hypertension, you may need to reduce your sodium intake to 1,500 mg a day.  Limit alcohol intake to no more than 1 drink a day for nonpregnant women and 2 drinks a day for men. One drink equals 12 oz of beer, 5 oz of wine, or 1 oz of hard liquor.  Work with your health care provider to maintain a healthy body weight or to lose weight. Ask what an ideal weight is for you.  Get at least 30 minutes of exercise that causes your heart to beat faster (aerobic exercise) most days of the week. Activities may include walking, swimming, or biking.  Work with your health care provider or diet and nutrition specialist (dietitian) to adjust your eating plan to your individual calorie needs. Reading food labels  Check food labels for the amount of sodium per serving. Choose foods with less than 5 percent of the Daily Value of sodium. Generally, foods with less than 300 mg of sodium per serving fit into this eating plan.  To find whole grains, look for the word "whole" as the first word in the ingredient list. Shopping  Buy products labeled as "low-sodium" or "no salt added."  Buy fresh foods. Avoid canned foods and premade or frozen meals. Cooking  Avoid adding salt when cooking. Use salt-free seasonings or herbs instead of table salt or sea salt. Check with your health care provider or pharmacist before using salt substitutes.  Do not fry foods. Cook foods using healthy  methods such as baking, boiling, grilling, and broiling instead.  Cook with heart-healthy oils, such as olive, canola, soybean, or sunflower oil. Meal planning   Eat a balanced diet that includes: ? 5 or more servings of fruits and vegetables each day. At each meal, try to fill half of your plate with fruits and vegetables. ? Up to 6-8 servings of whole grains each day. ? Less than 6 oz of lean meat, poultry, or fish each day. A 3-oz serving of meat is about the same size as a deck of cards. One egg equals 1 oz. ? 2 servings of low-fat dairy each day. ? A serving of nuts, seeds, or beans 5 times each week. ? Heart-healthy fats. Healthy fats called Omega-3 fatty acids are found in foods such as flaxseeds and coldwater fish, like sardines, salmon, and mackerel.  Limit how much you eat of the following: ? Canned or prepackaged foods. ? Food that is high in trans fat, such as fried foods. ? Food that is high in saturated fat, such as fatty meat. ? Sweets, desserts, sugary drinks, and other foods with added sugar. ? Full-fat dairy products.  Do not salt foods before eating.  Try to eat at least 2 vegetarian meals each week.  Eat more home-cooked food and less restaurant, buffet, and fast food.  When eating at a restaurant, ask that your food be prepared with less salt or no salt, if possible. What foods are recommended? The items  listed may not be a complete list. Talk with your dietitian about what dietary choices are best for you. Grains Whole-grain or whole-wheat bread. Whole-grain or whole-wheat pasta. Brown rice. [place] Morrow. Bulgur. Whole-grain and low-sodium cereals. Pita bread. Low-fat, low-sodium crackers. Whole-wheat flour tortillas. Vegetables Fresh or frozen vegetables (raw, steamed, roasted, or grilled). Low-sodium or reduced-sodium tomato and vegetable juice. Low-sodium or reduced-sodium tomato sauce and tomato paste. Low-sodium or reduced-sodium canned  vegetables. Fruits All fresh, dried, or frozen fruit. Canned fruit in natural juice (without added sugar). Meat and other protein foods Skinless chicken or [place]. Ground chicken or [place]. Pork with fat trimmed off. Fish and seafood. Egg whites. Dried beans, peas, or lentils. Unsalted nuts, nut butters, and seeds. Unsalted canned beans. Lean cuts of beef with fat trimmed off. Low-sodium, lean deli meat. Dairy Low-fat (1%) or fat-free (skim) milk. Fat-free, low-fat, or reduced-fat cheeses. Nonfat, low-sodium ricotta or cottage cheese. Low-fat or nonfat yogurt. Low-fat, low-sodium cheese. Fats and oils Soft margarine without trans fats. Vegetable oil. Low-fat, reduced-fat, or light mayonnaise and salad dressings (reduced-sodium). Canola, safflower, olive, soybean, and sunflower oils. Avocado. Seasoning and other foods Herbs. Spices. Seasoning mixes without salt. Unsalted popcorn and pretzels. Fat-free sweets. What foods are not recommended? The items listed may not be a complete list. Talk with your dietitian about what dietary choices are best for you. Grains Baked goods made with fat, such as croissants, muffins, or some breads. Dry pasta or rice meal packs. Vegetables Creamed or fried vegetables. Vegetables in a cheese sauce. Regular canned vegetables (not low-sodium or reduced-sodium). Regular canned tomato sauce and paste (not low-sodium or reduced-sodium). Regular tomato and vegetable juice (not low-sodium or reduced-sodium). Angie Fava. Olives. Fruits Canned fruit in a light or heavy syrup. Fried fruit. Fruit in cream or butter sauce. Meat and other protein foods Fatty cuts of meat. Ribs. Fried meat. Berniece Salines. Sausage. Bologna and other processed lunch meats. Salami. Fatback. Hotdogs. Bratwurst. Salted nuts and seeds. Canned beans with added salt. Canned or smoked fish. Whole eggs or egg yolks. Chicken or [place] with skin. Dairy Whole or 2% milk, cream, and half-and-half. Whole or full-fat  cream cheese. Whole-fat or sweetened yogurt. Full-fat cheese. Nondairy creamers. Whipped toppings. Processed cheese and cheese spreads. Fats and oils Butter. Stick margarine. Lard. Shortening. Ghee. Bacon fat. Tropical oils, such as coconut, palm kernel, or palm oil. Seasoning and other foods Salted popcorn and pretzels. Onion salt, garlic salt, seasoned salt, table salt, and sea salt. Worcestershire sauce. Tartar sauce. Barbecue sauce. Teriyaki sauce. Soy sauce, including reduced-sodium. Steak sauce. Canned and packaged gravies. Fish sauce. Oyster sauce. Cocktail sauce. Horseradish that you find on the shelf. Ketchup. Mustard. Meat flavorings and tenderizers. Bouillon cubes. Hot sauce and Tabasco sauce. Premade or packaged marinades. Premade or packaged taco seasonings. Relishes. Regular salad dressings. Where to find more information:  National Heart, Lung, and [place]: https://wilson-eaton.com/  American Heart Association: www.heart.org Summary  The DASH eating plan is a healthy eating plan that has been shown to reduce high blood pressure (hypertension). It may also reduce your risk for type 2 diabetes, heart disease, and stroke.  With the DASH eating plan, you should limit salt (sodium) intake to 2,300 mg a day. If you have hypertension, you may need to reduce your sodium intake to 1,500 mg a day.  When on the DASH eating plan, aim to eat more fresh fruits and vegetables, whole grains, lean proteins, low-fat dairy, and heart-healthy fats.  Work with your health care provider or  diet and nutrition specialist (dietitian) to adjust your eating plan to your individual calorie needs. This information is not intended to replace advice given to you by your health care provider. Make sure you discuss any questions you have with your health care provider. Document Released: 01/17/2011 Document Revised: 01/22/2016 Document Reviewed: 01/22/2016 Elsevier Interactive Patient Education  2017 [employer].

## 2017-01-06 NOTE — Progress Notes (Signed)
Subjective:  Patient ID: Jennifer [name], female    DOB: [birth year]  Age: 56 y.o. MRN: [id number]  CC: Follow-up (2 wk follow up---lower right abd pain--going on but worse this week. )   Abdominal Pain  This is a new problem. The current episode started 1 to 4 weeks ago. The onset quality is gradual. The problem occurs constantly. The problem has been gradually worsening. The pain is located in the RLQ. The pain is moderate. The quality of the pain is a sensation of fullness and aching. The abdominal pain does not radiate. Associated symptoms include constipation. Pertinent negatives include no anorexia, belching, diarrhea, dysuria, fever, frequency, hematochezia, hematuria, melena, myalgias, nausea, vomiting or weight loss. Nothing aggravates the pain. The pain is relieved by nothing. Jennifer [name] has tried nothing for the symptoms.  Constipation x 1week, improved with laxative. S/p hysterectomy several years ago.  Headache: Resolved with use of excedrin.  HTN: uncontrolled. Taking only amlodipine and coreg at this time. Reports Jennifer [name] is not interested in taking any additional medications. Has not made any changes to diet at this time. Denies any adverse effects from medications. BP Readings from Last 3 Encounters:  01/06/17 (!) 150/96  12/19/16 (!) 168/106  06/28/16 120/80   Outpatient Medications Prior to Visit  Medication Sig Dispense Refill  . aspirin-acetaminophen-caffeine (EXCEDRIN EXTRA STRENGTH) 250-250-65 MG tablet Take 1 tablet every 8 (eight) hours as needed by mouth for headache. 30 tablet 0  . Multiple Vitamin (MULTIVITAMIN WITH MINERALS) TABS tablet Take 1 tablet by mouth at bedtime. ALIVE multi-vitamin    . amLODipine (NORVASC) 10 MG tablet Take 1 tablet (10 mg total) at bedtime by mouth. 30 tablet 0  . carvedilol (COREG) 25 MG tablet TAKE 1 TABLET (25 MG) BY MOUTH 2 TIMES A DAY WITH A MEAL. 60 tablet 0  . flecainide (TAMBOCOR) 50 MG tablet TAKE 1&1/2 TABLETS (75 MG TOTAL) BY  MOUTH 2 (TWO) TIMES DAILY. (Patient not taking: Reported on 01/06/2017) 90 tablet 3  . olmesartan-hydrochlorothiazide (BENICAR HCT) 20-12.5 MG tablet Take 1 tablet by mouth daily. (Patient not taking: Reported on 01/06/2017) 30 tablet 2   No facility-administered medications prior to visit.     ROS See HPI  Objective:  BP (!) 150/96   Pulse 72   Temp 98 F (36.7 C)   Ht 5\' 2"  (1.575 m)   Wt 243 lb (110.2 kg)   SpO2 94%   BMI 44.45 kg/m   BP Readings from Last 3 Encounters:  01/06/17 (!) 150/96  12/19/16 (!) 168/106  06/28/16 120/80    Wt Readings from Last 3 Encounters:  01/06/17 243 lb (110.2 kg)  12/19/16 246 lb (111.6 kg)  06/28/16 253 lb 3.2 oz (114.9 kg)    Physical Exam  Constitutional: Jennifer [name] is oriented to person, place, and time. No distress.  Cardiovascular: Normal rate, regular rhythm and normal heart sounds.  Pulmonary/Chest: Effort normal and breath sounds normal.  Abdominal: Soft. Bowel sounds are normal. Jennifer [name] exhibits no distension and no mass. There is tenderness in the right lower quadrant. There is no rigidity, no rebound, no guarding, no CVA tenderness, no tenderness at McBurney's point and negative Murphy's sign.  Musculoskeletal: Jennifer [name] exhibits no edema.  Neurological: Jennifer [name] is alert and oriented to person, place, and time.  Skin: Skin is warm and dry. No rash noted. No erythema.  Psychiatric: Jennifer [name] has a normal mood and affect. Her behavior is normal.  Vitals reviewed.   Lab Results  Component Value Date  WBC 5.8 10/08/2015   HGB 12.8 10/08/2015   HCT 40.0 10/08/2015   PLT 310 10/08/2015   GLUCOSE 90 01/26/2016   CHOL 180 03/10/2015   TRIG 42.0 03/10/2015   HDL 51.90 03/10/2015   LDLCALC 120 (H) 03/10/2015   ALT 13 03/10/2015   AST 16 03/10/2015   NA 142 01/26/2016   K 4.1 01/26/2016   CL 102 01/26/2016   CREATININE 0.79 01/26/2016   BUN 14 01/26/2016   CO2 31 01/26/2016   TSH 1.77 10/20/2015   INR 1.0 12/11/2007   HGBA1C 5.9 10/20/2015     Mm Screening Breast Tomo Bilateral  Result Date: 01/27/2016 CLINICAL DATA:  Screening. EXAM: 2D DIGITAL SCREENING BILATERAL MAMMOGRAM WITH CAD AND ADJUNCT TOMO COMPARISON:  Previous exam(s). ACR Breast Density Category c: The breast tissue is heterogeneously dense, which may obscure small masses. FINDINGS: There are no findings suspicious for malignancy. Images were processed with CAD. IMPRESSION: No mammographic evidence of malignancy. A result letter of this screening mammogram will be mailed directly to the patient. RECOMMENDATION: Screening mammogram in one year. (Code:SM-B-01Y) BI-RADS CATEGORY  1: Negative. Electronically Signed   By: Dorise Bullion III M.D   On: 01/29/2016 18:52    Assessment & Plan:   Jennifer [name] was seen today for follow-up.  Diagnoses and all orders for this visit:  Continuous RLQ abdominal pain -     CBC -     Hepatic function panel -     CT Abdomen Pelvis Wo Contrast; Future  Essential hypertension, benign -     Basic metabolic panel  Right lower quadrant abdominal pain  Essential hypertension -     amLODipine (NORVASC) 10 MG tablet; Take 1 tablet (10 mg total) by mouth at bedtime. -     carvedilol (COREG) 25 MG tablet; TAKE 1 TABLET (25 MG) BY MOUTH 2 TIMES A DAY WITH A MEAL.   I have changed Jennifer [name]'s amLODipine. I am also having her maintain her multivitamin with minerals, olmesartan-hydrochlorothiazide, flecainide, aspirin-acetaminophen-caffeine, and carvedilol.  Meds ordered this encounter  Medications  . amLODipine (NORVASC) 10 MG tablet    Sig: Take 1 tablet (10 mg total) by mouth at bedtime.    Dispense:  90 tablet    Refill:  1    Order Specific Question:   Supervising Provider    Answer:   Lucille Passy [[id number]]  . carvedilol (COREG) 25 MG tablet    Sig: TAKE 1 TABLET (25 MG) BY MOUTH 2 TIMES A DAY WITH A MEAL.    Dispense:  180 tablet    Refill:  1    Order Specific Question:   Supervising Provider    Answer:   Lucille Passy [[id number]]    Follow-up: Return in about 3 months (around 04/08/2017) for HTN (fasting).  Wilfred Lacy, NP

## 2017-01-13 ENCOUNTER — Other Ambulatory Visit: Payer: Self-pay

## 2017-01-23 ENCOUNTER — Ambulatory Visit (INDEPENDENT_AMBULATORY_CARE_PROVIDER_SITE_OTHER)
Admission: RE | Admit: 2017-01-23 | Discharge: 2017-01-23 | Disposition: A | Discharge status: Home / Self Care | Payer: Managed Care, Other (non HMO) | Source: Ambulatory Visit | Attending: Nurse Practitioner | Admitting: Nurse Practitioner

## 2017-01-23 DIAGNOSIS — R1031 Right lower quadrant pain: Secondary | ICD-10-CM | POA: Diagnosis not present

## 2017-01-23 MED ORDER — IOPAMIDOL (ISOVUE-300) INJECTION 61%
100.0000 mL | Freq: Once | INTRAVENOUS | Status: AC | PRN
  Administered 2017-01-23: 100 mL via INTRAVENOUS

## 2017-01-31 ENCOUNTER — Encounter (HOSPITAL_BASED_OUTPATIENT_CLINIC_OR_DEPARTMENT_OTHER): Payer: Self-pay

## 2017-01-31 ENCOUNTER — Ambulatory Visit (HOSPITAL_BASED_OUTPATIENT_CLINIC_OR_DEPARTMENT_OTHER)
Admission: RE | Admit: 2017-01-31 | Discharge: 2017-01-31 | Disposition: A | Discharge status: Home / Self Care | Payer: Managed Care, Other (non HMO) | Source: Ambulatory Visit | Attending: Family | Admitting: Family

## 2017-01-31 ENCOUNTER — Other Ambulatory Visit: Payer: Self-pay | Admitting: Nurse Practitioner

## 2017-01-31 DIAGNOSIS — Z1231 Encounter for screening mammogram for malignant neoplasm of breast: Secondary | ICD-10-CM

## 2017-02-14 ENCOUNTER — Ambulatory Visit: Payer: Managed Care, Other (non HMO) | Admitting: Nurse Practitioner

## 2017-02-14 ENCOUNTER — Encounter: Payer: Self-pay | Admitting: Nurse Practitioner

## 2017-02-14 DIAGNOSIS — R0789 Other chest pain: Secondary | ICD-10-CM

## 2017-02-14 DIAGNOSIS — M542 Cervicalgia: Secondary | ICD-10-CM | POA: Diagnosis not present

## 2017-02-14 MED ORDER — CYCLOBENZAPRINE HCL 5 MG PO TABS: 5.0000 mg | ORAL_TABLET | Freq: Two times a day (BID) | ORAL | 0 refills | Status: DC | PRN

## 2017-02-14 MED ORDER — NAPROXEN SODIUM 550 MG PO TABS: 550.0000 mg | ORAL_TABLET | Freq: Two times a day (BID) | ORAL | 0 refills | Status: DC | PRN

## 2017-02-14 MED FILL — CARVEDILOL 25 MG TABS: 25 | 30 days supply | Qty: 60 | Fill #0

## 2017-02-14 MED FILL — NAPROXEN SODIUM 550 MG TAB: 550 | 10 days supply | Qty: 20 | Fill #0

## 2017-02-14 MED FILL — AMLODIPINE BESYLATE 10 MG T: 10 | 30 days supply | Qty: 30 | Fill #0

## 2017-02-14 MED FILL — CYCLOBENZAPRINE 5 MG TABLET: 5 | 10 days supply | Qty: 20 | Fill #0
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
## 2017-02-14 NOTE — Patient Instructions (Addendum)
BP medication refills were sent to [facility].  Muscle Strain A muscle strain is an injury that occurs when a muscle is stretched beyond its normal length. Usually a small number of muscle fibers are torn when this happens. Muscle strain is rated in degrees. First-degree strains have the least amount of muscle fiber tearing and pain. Second-degree and third-degree strains have increasingly more tearing and pain. Usually, recovery from muscle strain takes 1-2 weeks. Complete healing takes 5-6 weeks. What are the causes? Muscle strain happens when a sudden, violent force placed on a muscle stretches it too far. This may occur with lifting, sports, or a fall. What increases the risk? Muscle strain is especially common in athletes. What are the signs or symptoms? At the site of the muscle strain, there may be:  Pain.  Bruising.  Swelling.  Difficulty using the muscle due to pain or lack of normal function.  How is this diagnosed? Your health care provider will perform a physical exam and ask about your medical history. How is this treated? Often, the best treatment for a muscle strain is resting, icing, and applying cold compresses to the injured area. Follow these instructions at home:  Use the PRICE method of treatment to promote muscle healing during the first 2-3 days after your injury. The PRICE method involves: ? Protecting the muscle from being injured again. ? Restricting your activity and resting the injured body part. ? Icing your injury. To do this, put ice in a plastic bag. Place a towel between your skin and the bag. Then, apply the ice and leave it on from 15-20 minutes each hour. After the third day, switch to moist heat packs. ? Apply compression to the injured area with a splint or elastic bandage. Be careful not to wrap it too tightly. This may interfere with blood circulation or increase swelling. ? Elevate the injured body part above the level of your heart as  often as you can.  Only take over-the-counter or prescription medicines for pain, discomfort, or fever as directed by your health care provider.  Warming up prior to exercise helps to prevent future muscle strains. Contact a health care provider if:  You have increasing pain or swelling in the injured area.  You have numbness, tingling, or a significant loss of strength in the injured area. This information is not intended to replace advice given to you by your health care provider. Make sure you discuss any questions you have with your health care provider. Document Released: 01/28/2005 Document Revised: 07/06/2015 Document Reviewed: 08/27/2012 Elsevier Interactive Patient Education  2017 [employer].

## 2017-04-09 MED FILL — CARVEDILOL 25 MG TABS: 25 | 30 days supply | Qty: 60 | Fill #1

## 2017-04-09 MED FILL — AMLODIPINE BESYLATE 10 MG T: 10 | 30 days supply | Qty: 30 | Fill #1

## 2017-04-11 ENCOUNTER — Encounter: Payer: Self-pay | Admitting: Nurse Practitioner

## 2017-04-11 ENCOUNTER — Ambulatory Visit (INDEPENDENT_AMBULATORY_CARE_PROVIDER_SITE_OTHER): Payer: Managed Care, Other (non HMO) | Admitting: Nurse Practitioner

## 2017-04-11 VITALS — BP 140/98 | HR 77 | Temp 98.4°F | Ht 62.0 in | Wt 247.0 lb

## 2017-04-11 DIAGNOSIS — Z6841 Body Mass Index (BMI) 40.0 and over, adult: Secondary | ICD-10-CM

## 2017-04-11 DIAGNOSIS — Z23 Encounter for immunization: Secondary | ICD-10-CM

## 2017-04-11 DIAGNOSIS — Z136 Encounter for screening for cardiovascular disorders: Secondary | ICD-10-CM

## 2017-04-11 DIAGNOSIS — Z0001 Encounter for general adult medical examination with abnormal findings: Secondary | ICD-10-CM | POA: Diagnosis not present

## 2017-04-11 DIAGNOSIS — Z13220 Encounter for screening for lipoid disorders: Secondary | ICD-10-CM

## 2017-04-11 DIAGNOSIS — R739 Hyperglycemia, unspecified: Secondary | ICD-10-CM

## 2017-04-11 DIAGNOSIS — I10 Essential (primary) hypertension: Secondary | ICD-10-CM

## 2017-04-11 MED ORDER — OLMESARTAN MEDOXOMIL 5 MG PO TABS: 10.0000 mg | ORAL_TABLET | Freq: Every day | ORAL | 0 refills | Status: DC

## 2017-04-11 NOTE — Patient Instructions (Signed)
DASH Eating Plan DASH stands for "Dietary Approaches to Stop Hypertension." The DASH eating plan is a healthy eating plan that has been shown to reduce high blood pressure (hypertension). It may also reduce your risk for type 2 diabetes, heart disease, and stroke. The DASH eating plan may also help with weight loss. What are tips for following this plan? General guidelines  Avoid eating more than 2,300 mg (milligrams) of salt (sodium) a day. If you have hypertension, you may need to reduce your sodium intake to 1,500 mg a day.  Limit alcohol intake to no more than 1 drink a day for nonpregnant women and 2 drinks a day for men. One drink equals 12 oz of beer, 5 oz of wine, or 1 oz of hard liquor.  Work with your health care provider to maintain a healthy body weight or to lose weight. Ask what an ideal weight is for you.  Get at least 30 minutes of exercise that causes your heart to beat faster (aerobic exercise) most days of the week. Activities may include walking, swimming, or biking.  Work with your health care provider or diet and nutrition specialist (dietitian) to adjust your eating plan to your individual calorie needs. Reading food labels  Check food labels for the amount of sodium per serving. Choose foods with less than 5 percent of the Daily Value of sodium. Generally, foods with less than 300 mg of sodium per serving fit into this eating plan.  To find whole grains, look for the word "whole" as the first word in the ingredient list. Shopping  Buy products labeled as "low-sodium" or "no salt added."  Buy fresh foods. Avoid canned foods and premade or frozen meals. Cooking  Avoid adding salt when cooking. Use salt-free seasonings or herbs instead of table salt or sea salt. Check with your health care provider or pharmacist before using salt substitutes.  Do not fry foods. Cook foods using healthy methods such as baking, boiling, grilling, and broiling instead.  Cook with  heart-healthy oils, such as olive, canola, soybean, or sunflower oil. Meal planning   Eat a balanced diet that includes: ? 5 or more servings of fruits and vegetables each day. At each meal, try to fill half of your plate with fruits and vegetables. ? Up to 6-8 servings of whole grains each day. ? Less than 6 oz of lean meat, poultry, or fish each day. A 3-oz serving of meat is about the same size as a deck of cards. One egg equals 1 oz. ? 2 servings of low-fat dairy each day. ? A serving of nuts, seeds, or beans 5 times each week. ? Heart-healthy fats. Healthy fats called Omega-3 fatty acids are found in foods such as flaxseeds and coldwater fish, like sardines, salmon, and mackerel.  Limit how much you eat of the following: ? Canned or prepackaged foods. ? Food that is high in trans fat, such as fried foods. ? Food that is high in saturated fat, such as fatty meat. ? Sweets, desserts, sugary drinks, and other foods with added sugar. ? Full-fat dairy products.  Do not salt foods before eating.  Try to eat at least 2 vegetarian meals each week.  Eat more home-cooked food and less restaurant, buffet, and fast food.  When eating at a restaurant, ask that your food be prepared with less salt or no salt, if possible. What foods are recommended? The items listed may not be a complete list. Talk with your dietitian about what   dietary choices are best for you. Grains Whole-grain or whole-wheat bread. Whole-grain or whole-wheat pasta. Brown rice. Oatmeal. Quinoa. Bulgur. Whole-grain and low-sodium cereals. Pita bread. Low-fat, low-sodium crackers. Whole-wheat flour tortillas. Vegetables Fresh or frozen vegetables (raw, steamed, roasted, or grilled). Low-sodium or reduced-sodium tomato and vegetable juice. Low-sodium or reduced-sodium tomato sauce and tomato paste. Low-sodium or reduced-sodium canned vegetables. Fruits All fresh, dried, or frozen fruit. Canned fruit in natural juice (without  added sugar). Meat and other protein foods Skinless chicken or turkey. Ground chicken or turkey. Pork with fat trimmed off. Fish and seafood. Egg whites. Dried beans, peas, or lentils. Unsalted nuts, nut butters, and seeds. Unsalted canned beans. Lean cuts of beef with fat trimmed off. Low-sodium, lean deli meat. Dairy Low-fat (1%) or fat-free (skim) milk. Fat-free, low-fat, or reduced-fat cheeses. Nonfat, low-sodium ricotta or cottage cheese. Low-fat or nonfat yogurt. Low-fat, low-sodium cheese. Fats and oils Soft margarine without trans fats. Vegetable oil. Low-fat, reduced-fat, or light mayonnaise and salad dressings (reduced-sodium). Canola, safflower, olive, soybean, and sunflower oils. Avocado. Seasoning and other foods Herbs. Spices. Seasoning mixes without salt. Unsalted popcorn and pretzels. Fat-free sweets. What foods are not recommended? The items listed may not be a complete list. Talk with your dietitian about what dietary choices are best for you. Grains Baked goods made with fat, such as croissants, muffins, or some breads. Dry pasta or rice meal packs. Vegetables Creamed or fried vegetables. Vegetables in a cheese sauce. Regular canned vegetables (not low-sodium or reduced-sodium). Regular canned tomato sauce and paste (not low-sodium or reduced-sodium). Regular tomato and vegetable juice (not low-sodium or reduced-sodium). Pickles. Olives. Fruits Canned fruit in a light or heavy syrup. Fried fruit. Fruit in cream or butter sauce. Meat and other protein foods Fatty cuts of meat. Ribs. Fried meat. Bacon. Sausage. Bologna and other processed lunch meats. Salami. Fatback. Hotdogs. Bratwurst. Salted nuts and seeds. Canned beans with added salt. Canned or smoked fish. Whole eggs or egg yolks. Chicken or turkey with skin. Dairy Whole or 2% milk, cream, and half-and-half. Whole or full-fat cream cheese. Whole-fat or sweetened yogurt. Full-fat cheese. Nondairy creamers. Whipped toppings.  Processed cheese and cheese spreads. Fats and oils Butter. Stick margarine. Lard. Shortening. Ghee. Bacon fat. Tropical oils, such as coconut, palm kernel, or palm oil. Seasoning and other foods Salted popcorn and pretzels. Onion salt, garlic salt, seasoned salt, table salt, and sea salt. Worcestershire sauce. Tartar sauce. Barbecue sauce. Teriyaki sauce. Soy sauce, including reduced-sodium. Steak sauce. Canned and packaged gravies. Fish sauce. Oyster sauce. Cocktail sauce. Horseradish that you find on the shelf. Ketchup. Mustard. Meat flavorings and tenderizers. Bouillon cubes. Hot sauce and Tabasco sauce. Premade or packaged marinades. Premade or packaged taco seasonings. Relishes. Regular salad dressings. Where to find more information:  National Heart, Lung, and Blood Institute: www.nhlbi.nih.gov  American Heart Association: www.heart.org Summary  The DASH eating plan is a healthy eating plan that has been shown to reduce high blood pressure (hypertension). It may also reduce your risk for type 2 diabetes, heart disease, and stroke.  With the DASH eating plan, you should limit salt (sodium) intake to 2,300 mg a day. If you have hypertension, you may need to reduce your sodium intake to 1,500 mg a day.  When on the DASH eating plan, aim to eat more fresh fruits and vegetables, whole grains, lean proteins, low-fat dairy, and heart-healthy fats.  Work with your health care provider or diet and nutrition specialist (dietitian) to adjust your eating plan to your individual   calorie needs. This information is not intended to replace advice given to you by your health care provider. Make sure you discuss any questions you have with your health care provider. Document Released: 01/17/2011 Document Revised: 01/22/2016 Document Reviewed: 01/22/2016 Elsevier Interactive Patient Education  2018 [place] Calories are units of energy. Your body needs a certain  amount of calories from food to keep you going throughout the day. When you eat more calories than your body needs, your body stores the extra calories as fat. When you eat fewer calories than your body needs, your body burns fat to get the energy it needs. Calorie counting means keeping track of how many calories you eat and drink each day. Calorie counting can be helpful if you need to lose weight. If you make sure to eat fewer calories than your body needs, you should lose weight. Ask your health care provider what a healthy weight is for you. For calorie counting to work, you will need to eat the right number of calories in a day in order to lose a healthy amount of weight per week. A dietitian can help you determine how many calories you need in a day and will give you suggestions on how to reach your calorie goal.  A healthy amount of weight to lose per week is usually 1-2 lb (0.5-0.9 kg). This usually means that your daily calorie intake should be reduced by 500-750 calories.  Eating 1,200 - 1,500 calories per day can help most women lose weight.  Eating 1,500 - 1,800 calories per day can help most men lose weight.  What is my plan? My goal is to have _____1800____ calories per day. If I have this many calories per day, I should lose around ______1____ pounds per week. What do I need to know about calorie counting? In order to meet your daily calorie goal, you will need to:  Find out how many calories are in each food you would like to eat. Try to do this before you eat.  Decide how much of the food you plan to eat.  Write down what you ate and how many calories it had. Doing this is called keeping a food log.  To successfully lose weight, it is important to balance calorie counting with a healthy lifestyle that includes regular activity. Aim for 150 minutes of moderate exercise (such as walking) or 75 minutes of vigorous exercise (such as running) each week. Where do I find calorie  information?  The number of calories in a food can be found on a Nutrition Facts label. If a food does not have a Nutrition Facts label, try to look up the calories online or ask your dietitian for help. Remember that calories are listed per serving. If you choose to have more than one serving of a food, you will have to multiply the calories per serving by the amount of servings you plan to eat. For example, the label on a package of bread might say that a serving size is 1 slice and that there are 90 calories in a serving. If you eat 1 slice, you will have eaten 90 calories. If you eat 2 slices, you will have eaten 180 calories. How do I keep a food log? Immediately after each meal, record the following information in your food log:  What you ate. Don't forget to include toppings, sauces, and other extras on the food.  How much you ate. This can  be measured in cups, ounces, or number of items.  How many calories each food and drink had.  The total number of calories in the meal.  Keep your food log near you, such as in a small notebook in your pocket, or use a mobile app or website. Some programs will calculate calories for you and show you how many calories you have left for the day to meet your goal. What are some calorie counting tips?  Use your calories on foods and drinks that will fill you up and not leave you hungry: ? Some examples of foods that fill you up are nuts and nut butters, vegetables, lean proteins, and high-fiber foods like whole grains. High-fiber foods are foods with more than 5 g fiber per serving. ? Drinks such as sodas, specialty coffee drinks, alcohol, and juices have a lot of calories, yet do not fill you up.  Eat nutritious foods and avoid empty calories. Empty calories are calories you get from foods or beverages that do not have many vitamins or protein, such as candy, sweets, and soda. It is better to have a nutritious high-calorie food (such as an avocado) than  a food with few nutrients (such as a bag of chips).  Know how many calories are in the foods you eat most often. This will help you calculate calorie counts faster.  Pay attention to calories in drinks. Low-calorie drinks include water and unsweetened drinks.  Pay attention to nutrition labels for "low fat" or "fat free" foods. These foods sometimes have the same amount of calories or more calories than the full fat versions. They also often have added sugar, starch, or salt, to make up for flavor that was removed with the fat.  Find a way of tracking calories that works for you. Get creative. Try different apps or programs if writing down calories does not work for you. What are some portion control tips?  Know how many calories are in a serving. This will help you know how many servings of a certain food you can have.  Use a measuring cup to measure serving sizes. You could also try weighing out portions on a kitchen scale. With time, you will be able to estimate serving sizes for some foods.  Take some time to put servings of different foods on your favorite plates, bowls, and cups so you know what a serving looks like.  Try not to eat straight from a bag or box. Doing this can lead to overeating. Put the amount you would like to eat in a cup or on a plate to make sure you are eating the right portion.  Use smaller plates, glasses, and bowls to prevent overeating.  Try not to multitask (for example, watch TV or use your computer) while eating. If it is time to eat, sit down at a table and enjoy your food. This will help you to know when you are full. It will also help you to be aware of what you are eating and how much you are eating. What are tips for following this plan? Reading food labels  Check the calorie count compared to the serving size. The serving size may be smaller than what you are used to eating.  Check the source of the calories. Make sure the food you are eating is high  in vitamins and protein and low in saturated and trans fats. Shopping  Read nutrition labels while you shop. This will help you make healthy decisions before you  decide to purchase your food.  Make a grocery list and stick to it. Cooking  Try to cook your favorite foods in a healthier way. For example, try baking instead of frying.  Use low-fat dairy products. Meal planning  Use more fruits and vegetables. Half of your plate should be fruits and vegetables.  Include lean proteins like poultry and fish. How do I count calories when eating out?  Ask for smaller portion sizes.  Consider sharing an entree and sides instead of getting your own entree.  If you get your own entree, eat only half. Ask for a box at the beginning of your meal and put the rest of your entree in it so you are not tempted to eat it.  If calories are listed on the menu, choose the lower calorie options.  Choose dishes that include vegetables, fruits, whole grains, low-fat dairy products, and lean protein.  Choose items that are boiled, broiled, grilled, or steamed. Stay away from items that are buttered, battered, fried, or served with cream sauce. Items labeled "crispy" are usually fried, unless stated otherwise.  Choose water, low-fat milk, unsweetened iced tea, or other drinks without added sugar. If you want an alcoholic beverage, choose a lower calorie option such as a glass of wine or light beer.  Ask for dressings, sauces, and syrups on the side. These are usually high in calories, so you should limit the amount you eat.  If you want a salad, choose a garden salad and ask for grilled meats. Avoid extra toppings like bacon, cheese, or fried items. Ask for the dressing on the side, or ask for olive oil and vinegar or lemon to use as dressing.  Estimate how many servings of a food you are given. For example, a serving of cooked rice is  cup or about the size of half a baseball. Knowing serving sizes will  help you be aware of how much food you are eating at restaurants. The list below tells you how big or small some common portion sizes are based on everyday objects: ? 1 oz-4 stacked dice. ? 3 oz-1 deck of cards. ? 1 tsp-1 die. ? 1 Tbsp- a ping-pong ball. ? 2 Tbsp-1 ping-pong ball. ?  cup- baseball. ? 1 cup-1 baseball. Summary  Calorie counting means keeping track of how many calories you eat and drink each day. If you eat fewer calories than your body needs, you should lose weight.  A healthy amount of weight to lose per week is usually 1-2 lb (0.5-0.9 kg). This usually means reducing your daily calorie intake by 500-750 calories.  The number of calories in a food can be found on a Nutrition Facts label. If a food does not have a Nutrition Facts label, try to look up the calories online or ask your dietitian for help.  Use your calories on foods and drinks that will fill you up, and not on foods and drinks that will leave you hungry.  Use smaller plates, glasses, and bowls to prevent overeating. This information is not intended to replace advice given to you by your health care provider. Make sure you discuss any questions you have with your health care provider. Document Released: 01/28/2005 Document Revised: 12/29/2015 Document Reviewed: 12/29/2015 Elsevier Interactive Patient Education  [employer].

## 2017-04-11 NOTE — Progress Notes (Signed)
Subjective:    Patient ID: Jennifer [name], female    DOB: 07/31/1960, 57 y.o.   MRN: [id number]  Patient presents today for complete physical HPI  HTN: uncontrolled Current use of amlodipine and coreg. Has not made changes to diet. BP Readings from Last 3 Encounters:  04/11/17 (!) 140/98  02/14/17 (!) 154/102  01/06/17 (!) 150/96   A-Fib: Denies any palpitation or chest pain or syncope or dizziness or SOB. Current use of coreg. Denies to resume flecainide or xarelto.  Obesity: Has not made any changes to diet. Has not started any exercise regimen. Admits to eating at restaurants because she does not have time to cook.  Immunizations: (TDAP, Hep C screen, Pneumovax, Influenza, zoster)  Health Maintenance  Topic Date Due  . Tetanus Vaccine  06/26/2016  . Flu Shot  06/01/2017*  . Colon Cancer Screening  06/17/2017  . Mammogram  02/01/2019  .  Hepatitis C: One time screening is recommended by Center for Disease Control  (CDC) for  adults born from [birth year] through 1967.   Completed  . HIV Screening  Completed  *Topic was postponed. The date shown is not the original due date.   Diet:regular, high sodium.  Weight:  Wt Readings from Last 3 Encounters:  04/11/17 247 lb (112 kg)  02/14/17 251 lb (113.9 kg)  01/06/17 243 lb (110.2 kg)    Exercise:none.  Fall Risk: Fall Risk  04/11/2017 01/12/2016  Falls in the past year? No No   Home Safety:home with 2 adult children.  Depression/Suicide: Depression screen [facility] 2/9 04/11/2017 01/12/2016 03/10/2015 01/28/2014  Decreased Interest 0 0 1 0  Down, Depressed, Hopeless 0 0 0 0  PHQ - 2 Score 0 0 1 0  Altered sleeping - - 1 -  Tired, decreased energy - - 1 -  Change in appetite - - 0 -  Feeling bad or failure about yourself  - - 0 -  Trouble concentrating - - 0 -  Moving slowly or fidgety/restless - - 0 -  Suicidal thoughts - - 0 -  PHQ-9 Score - - 3 -  Difficult doing work/chores - - Somewhat difficult -   Vision:needed,  she will schedule.  Dental: will schedule  Advanced Directive: Advanced Directives 06/14/2016  Does Patient Have a Medical Advance Directive? No  Would patient like information on creating a medical advance directive? -    Medications and allergies reviewed with patient and updated if appropriate.  Patient Active Problem List   Diagnosis Date Noted  . OSA (obstructive sleep apnea) 03/13/2016  . Hyperglycemia 01/12/2016  . Atrial fibrillation ([place]) 11/16/2015  . Esophageal reflux 10/16/2012  . Dysphagia, unspecified(787.20) 10/16/2012  . Abdominal pain, epigastric 09/11/2012  . 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 Carpal tunnel syndrome 01/03/2012  . Obesity 12/20/2011  . Neck pain 09/27/2011  . Left shoulder pain 09/27/2011  . Joint pain 06/04/2010  . General medical examination 06/04/2010  . HOT FLASHES 04/27/2010  . CHOLECYSTECTOMY, LAPAROSCOPIC, HX OF 08/09/2009  . ALLERGIC RHINITIS 04/26/2008  . Anemia 05/05/2007  . CONDYLOMA ACUMINATUM 04/15/2007  . Essential hypertension 07/03/2006    Current Outpatient Medications on File Prior to Visit  Medication Sig Dispense Refill  . amLODipine (NORVASC) 10 MG tablet Take 1 tablet (10 mg total) by mouth at bedtime. 90 tablet 1  . carvedilol (COREG) 25 MG tablet TAKE 1 TABLET (25 MG) BY MOUTH 2 TIMES A DAY WITH A MEAL. 180 tablet 1  . Multiple Vitamin (MULTIVITAMIN WITH  MINERALS) TABS tablet Take 1 tablet by mouth at bedtime. ALIVE multi-vitamin    . flecainide (TAMBOCOR) 50 MG tablet TAKE 1&1/2 TABLETS (75 MG TOTAL) BY MOUTH 2 (TWO) TIMES DAILY. (Patient not taking: Reported on 01/06/2017) 90 tablet 3  . naproxen sodium (ANAPROX) 550 MG tablet Take 1 tablet (550 mg total) by mouth 2 (two) times daily between meals as needed. (Patient not taking: Reported on 04/11/2017) 20 tablet 0   No current facility-administered medications on file prior to visit.     Past Medical History:  Diagnosis Date  . Allergy    allergic rhinitis    . Anemia 06/2007   C-scope 06/2007: tics, neg hemocults 07/2007  . Anxiety   . Chest pain 12/2007   negative ECHO, neg stress test 06/2008  . Condyloma acuminatum    history of  . Hyperglycemia    borderline. A1c 5.7  12/2007  . Hypertension     Past Surgical History:  Procedure Laterality Date  . ABDOMINAL HYSTERECTOMY     no oophorectomy  . BREAST EXCISIONAL BIOPSY    . BREAST SURGERY  age 24   Fibroid tumor removed right breast  . CARPAL TUNNEL RELEASE    . CESAREAN SECTION    . CHOLECYSTECTOMY      Social History   Socioeconomic History  . Marital status: Divorced    Spouse name: None  . Number of children: 3  . Years of education: None  . Highest education level: None  Social Needs  . Financial resource strain: None  . Food insecurity - worry: None  . Food insecurity - inability: None  . Transportation needs - medical: None  . Transportation needs - non-medical: None  Occupational History    Employer: [employer]  Tobacco Use  . Smoking status: Former Smoker    Packs/day: 0.50    Years: 27.00    Pack years: 13.50    Types: Cigarettes    Last attempt to quit: 02/12/2004    Years since quitting: 13.1  . Smokeless tobacco: Never Used  Substance and Sexual Activity  . Alcohol use: No  . Drug use: No  . Sexual activity: None  Other Topics Concern  . 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 Breast cancer Paternal Aunt   . Cervical cancer Cousin   . Diabetes Mother   . Coronary artery disease Father   . Kidney disease Father   . Diabetes Father   . Hypertension Father   . Prostate cancer Father   . Coronary artery disease Sister   . Kidney disease Sister   . Diabetes Sister   .  Hypertension Sister   . Kidney disease Brother   . Stroke Brother   . Hypertension Brother        x3  . Diabetes Maternal Grandmother   . Stroke Maternal Grandmother   . Diabetes Paternal Grandmother   . Stroke Paternal Grandmother   . Coronary artery disease Other        grandmother, uncle  . Kidney disease Other        grandmother  . Stroke Other  uncle  . Thyroid disease Other        aunts  . Glaucoma Other        grandmother  . Hypertension Other        grandmother  . Diabetes Other        aunts  . Hypothyroidism Other        aunt  . Hyperthyroidism Other        aunt  . Healthy Child   . Healthy Child   . Healthy Child   . Colon cancer Neg Hx         Review of Systems  Constitutional: Negative for fever, malaise/fatigue and weight loss.  HENT: Negative for congestion and sore throat.   Eyes:       Negative for visual changes  Respiratory: Negative for cough and shortness of breath.   Cardiovascular: Positive for leg swelling. Negative for chest pain, palpitations, orthopnea and PND.  Gastrointestinal: Negative for blood in stool, constipation, diarrhea and heartburn.  Genitourinary: Negative for dysuria, frequency and urgency.  Musculoskeletal: Negative for falls, joint pain and myalgias.  Skin: Negative for rash.  Neurological: Negative for dizziness, sensory change and headaches.  Endo/Heme/Allergies: Does not bruise/bleed easily.  Psychiatric/Behavioral: Negative for depression, substance abuse and suicidal ideas. The patient is not nervous/anxious.     Objective:   Vitals:   04/11/17 0903  BP: (!) 140/98  Pulse: 77  Temp: 98.4 F (36.9 C)  SpO2: 98%    Body mass index is 45.18 kg/m.   Physical Examination:  Physical Exam  Constitutional: She is oriented to person, place, and time and well-developed, well-nourished, and in no distress. No distress.  HENT:  Right Ear: External ear normal.  Left Ear: External ear normal.  Nose: Nose  normal.  Mouth/Throat: Oropharynx is clear and moist. No oropharyngeal exudate.  Eyes: Conjunctivae and EOM are normal. Pupils are equal, round, and reactive to light. No scleral icterus.  Neck: Normal range of motion. Neck supple. No thyromegaly present.  Cardiovascular: Normal rate, normal heart sounds and intact distal pulses.  Pulmonary/Chest: Effort normal and breath sounds normal. She exhibits no tenderness.  Declined breast exam  Abdominal: Soft. Bowel sounds are normal. She exhibits no distension. There is no tenderness.  Genitourinary:  Genitourinary Comments: Declined.  Musculoskeletal: Normal range of motion. She exhibits no edema or tenderness.  Lymphadenopathy:    She has no cervical adenopathy.  Neurological: She is alert and oriented to person, place, and time. Gait normal.  Skin: Skin is warm and dry.  Psychiatric: Affect and judgment normal.    ASSESSMENT and PLAN:  Jennifer [name] was seen today for annual exam.  Diagnoses and all orders for this visit:  Encounter for preventative adult health care exam with abnormal findings  Essential hypertension -     Basic metabolic panel -     olmesartan (BENICAR) 5 MG tablet; Take 2 tablets (10 mg total) by mouth daily.  Hyperglycemia -     Hemoglobin A1c  Encounter for lipid screening for cardiovascular disease -     Lipid panel  Class 3 severe obesity due to excess calories with serious comorbidity and body mass index (BMI) of 45.0 to 49.9 in adult (HCC) -     TSH  Need for diphtheria-tetanus-pertussis (Tdap) vaccine -     Tdap vaccine greater than or equal to 7yo IM   No problem-specific Assessment & Plan notes found for this encounter.     Follow up: Return in about 3  months (around 07/12/2017) for HTN re eval and weight loss.  Wilfred Lacy, NP

## 2017-06-04 ENCOUNTER — Encounter: Payer: Self-pay | Admitting: Gastroenterology

## 2017-06-09 MED FILL — AMLODIPINE BESYLATE 10 MG T: 10 | 30 days supply | Qty: 30 | Fill #2

## 2017-06-09 MED FILL — OLMESARTAN MEDOXOMIL 5 MG T: 5 | 30 days supply | Qty: 60 | Fill #0

## 2017-06-09 MED FILL — CARVEDILOL 25 MG TABS: 25 | 30 days supply | Qty: 60 | Fill #2

## 2017-07-15 ENCOUNTER — Ambulatory Visit: Payer: Managed Care, Other (non HMO) | Admitting: Pulmonary Disease

## 2017-07-15 ENCOUNTER — Encounter: Payer: Self-pay | Admitting: Pulmonary Disease

## 2017-07-15 DIAGNOSIS — G4733 Obstructive sleep apnea (adult) (pediatric): Secondary | ICD-10-CM

## 2017-07-15 NOTE — Assessment & Plan Note (Signed)
Weight loss of about 25 pounds advised If she reaches that goal, will consider repeating study

## 2017-07-15 NOTE — Assessment & Plan Note (Signed)
CPAP machine is working well. CPAP supplies will be renewed for a year.  Weight loss encouraged, compliance with goal of at least 4-6 hrs every night is the expectation. Advised against medications with sedative side effects Cautioned against driving when sleepy - understanding that sleepiness will vary on a day to day basis

## 2017-07-15 NOTE — Patient Instructions (Signed)
CPAP machine is working well. CPAP supplies will be renewed for a year.  Weight loss of about 25 pounds advised

## 2017-07-15 NOTE — Progress Notes (Signed)
   Subjective:    Patient ID: Jennifer [name], female    DOB: 02/27/1960, 57 y.o.   MRN: [id number]  HPI  57 yo obese hypertensive with atrialfibrillation For FUof obstructive sleep apnea.  She has come off flecainide and anticoagulation, back in sinus rhythm. CPAP is really helped to improve her daytime somnolence and fatigue, on nights that she does not use this she feels more tired. Snoring has been abolished. She is settled down with a full facemask. No problems with mask or pressure she inquires about cleaning machine. Download was reviewed which shows excellent control of events on auto settings 8-13 with average pressure of 13 cm, no residuals and minimal leak, compliance is acceptable at 4.5 hours    Significant tests/ events  PSG 01/2016 showed baseline portion of 141 minutes of sleep with AHI 26/hour, worse in rem sleep and lowest desaturation of 74%. >> CPAP of 13 cm and small fullface mask.   Review of Systems Patient denies significant dyspnea,cough, hemoptysis,  chest pain, palpitations, pedal edema, orthopnea, paroxysmal nocturnal dyspnea, lightheadedness, nausea, vomiting, abdominal or  leg pains      Objective:   Physical Exam   Gen. Pleasant, obese, in no distress ENT - no lesions, no post nasal drip Neck: No JVD, no thyromegaly, no carotid bruits Lungs: no use of accessory muscles, no dullness to percussion, decreased without rales or rhonchi  Cardiovascular: Rhythm regular, heart sounds  normal, no murmurs or gallops, 1+ peripheral edema Musculoskeletal: No deformities, no cyanosis or clubbing , no tremors        Assessment & Plan:

## 2017-07-18 MED FILL — OLMESARTAN MEDOXOMIL 5 MG T: 5 | 30 days supply | Qty: 60 | Fill #1

## 2017-07-18 MED FILL — CARVEDILOL 25 MG TABS: 25 | 30 days supply | Qty: 60 | Fill #3

## 2017-07-25 ENCOUNTER — Ambulatory Visit: Payer: Self-pay | Admitting: Nurse Practitioner

## 2017-08-29 MED FILL — CARVEDILOL 25 MG TABLET: 25 | 30 days supply | Qty: 60 | Fill #4

## 2017-10-21 ENCOUNTER — Ambulatory Visit: Payer: Managed Care, Other (non HMO) | Admitting: Nurse Practitioner

## 2017-10-21 ENCOUNTER — Encounter: Payer: Self-pay | Admitting: Nurse Practitioner

## 2017-10-21 VITALS — BP 142/80 | HR 66 | Temp 97.9°F | Ht 62.0 in | Wt 252.0 lb

## 2017-10-21 DIAGNOSIS — Z1211 Encounter for screening for malignant neoplasm of colon: Secondary | ICD-10-CM | POA: Diagnosis not present

## 2017-10-21 DIAGNOSIS — Z136 Encounter for screening for cardiovascular disorders: Secondary | ICD-10-CM | POA: Diagnosis not present

## 2017-10-21 DIAGNOSIS — I10 Essential (primary) hypertension: Secondary | ICD-10-CM | POA: Diagnosis not present

## 2017-10-21 DIAGNOSIS — Z13220 Encounter for screening for lipoid disorders: Secondary | ICD-10-CM | POA: Diagnosis not present

## 2017-10-21 DIAGNOSIS — R739 Hyperglycemia, unspecified: Secondary | ICD-10-CM

## 2017-10-21 LAB — CBC
HEMATOCRIT: 36.1 % (ref 36.0–46.0)
HEMOGLOBIN: 12 g/dL (ref 12.0–15.0)
MCHC: 33.1 g/dL (ref 30.0–36.0)
MCV: 88.4 fl (ref 78.0–100.0)
PLATELETS: 261 10*3/uL (ref 150.0–400.0)
RBC: 4.09 Mil/uL (ref 3.87–5.11)
RDW: 13.5 % (ref 11.5–15.5)
WBC: 4.8 10*3/uL (ref 4.0–10.5)

## 2017-10-21 LAB — BASIC METABOLIC PANEL
BUN: 11 mg/dL (ref 6–23)
CALCIUM: 9 mg/dL (ref 8.4–10.5)
CO2: 29 meq/L (ref 19–32)
Chloride: 104 mEq/L (ref 96–112)
Creatinine, Ser: 0.71 mg/dL (ref 0.40–1.20)
GFR: 108.41 mL/min (ref >60.00)
Glucose, Bld: 88 mg/dL (ref 70–99)
Potassium: 3.4 mEq/L — ABNORMAL LOW (ref 3.5–5.1)
SODIUM: 140 meq/L (ref 135–145)

## 2017-10-21 LAB — LIPID PANEL
CHOLESTEROL: 183 mg/dL (ref 0–200)
HDL: 46.5 mg/dL (ref >39.00)
LDL Cholesterol: 121 mg/dL — ABNORMAL HIGH (ref 0–99)
NonHDL: 136.75
TRIGLYCERIDES: 78 mg/dL (ref 0.0–149.0)
Total CHOL/HDL Ratio: 4
VLDL: 15.6 mg/dL (ref 0.0–40.0)

## 2017-10-21 LAB — HEMOGLOBIN A1C: Hgb A1c MFr Bld: 5.9 % (ref 4.6–6.5)

## 2017-10-21 MED ORDER — OLMESARTAN MEDOXOMIL 5 MG PO TABS: 10.0000 mg | ORAL_TABLET | Freq: Every day | ORAL | 3 refills | Status: DC

## 2017-10-21 MED ORDER — AMLODIPINE BESYLATE 10 MG PO TABS: 10.0000 mg | ORAL_TABLET | Freq: Every day | ORAL | 3 refills | Status: DC

## 2017-10-21 MED FILL — AMLODIPINE BESYLATE 10 MG T: 10 | 30 days supply | Qty: 30 | Fill #0

## 2017-10-21 MED FILL — CARVEDILOL 25 MG TABLET: 25 | 30 days supply | Qty: 60 | Fill #5

## 2017-10-21 NOTE — Patient Instructions (Addendum)
Stable HgbA1c at 5.9. Lipid panel with persistent elevated LDL. This can improve with DASH diet and regular exercise. BMP indicates mild decrease in potassium. Replace with potassium rich foods: dark green vegetables, banana, orange, beans Normal CBC.  Please continue DASH diet and walking 77mins daily.  Call GI office to make appt.

## 2017-10-21 NOTE — Progress Notes (Signed)
Subjective:  Patient ID: Jennifer [name], female    DOB: 10/26/1960  Age: 57 y.o. MRN: [id number]  CC: Follow-up (3 mo fu/weight is up and down. refill for Benicar )  HPI HTN: Waxing and waning with amlodipine, benicar and coreg. Made some changes to diet: low sodium and low carb. No exercise. BP Readings from Last 3 Encounters:  10/21/17 (!) 142/80  07/15/17 126/82  04/11/17 (!) 140/98   Reviewed past Medical, Social and Family history today.  Outpatient Medications Prior to Visit  Medication Sig Dispense Refill  . carvedilol (COREG) 25 MG tablet TAKE 1 TABLET (25 MG) BY MOUTH 2 TIMES A DAY WITH A MEAL. 180 tablet 1  . Multiple Vitamin (MULTIVITAMIN WITH MINERALS) TABS tablet Take 1 tablet by mouth at bedtime. ALIVE multi-vitamin    . amLODipine (NORVASC) 10 MG tablet Take 1 tablet (10 mg total) by mouth at bedtime. 90 tablet 1  . olmesartan (BENICAR) 5 MG tablet Take 2 tablets (10 mg total) by mouth daily. 180 tablet 0   No facility-administered medications prior to visit.     ROS Review of Systems  Constitutional: Negative.   Eyes: Negative.   Respiratory: Negative for cough, sputum production and shortness of breath.   Cardiovascular: Positive for leg swelling. Negative for chest pain, palpitations and orthopnea.  Gastrointestinal: Negative.   Musculoskeletal: Negative.   Neurological: Negative for dizziness, sensory change and headaches.     Objective:  BP (!) 142/80   Pulse 66   Temp 97.9 F (36.6 C) (Oral)   Ht 5\' 2"  (1.575 m)   Wt 252 lb (114.3 kg)   SpO2 96%   BMI 46.09 kg/m   BP Readings from Last 3 Encounters:  10/21/17 (!) 142/80  07/15/17 126/82  04/11/17 (!) 140/98    Wt Readings from Last 3 Encounters:  10/21/17 252 lb (114.3 kg)  07/15/17 250 lb (113.4 kg)  04/11/17 247 lb (112 kg)    Physical Exam  Constitutional: She is oriented to person, place, and time. She appears well-developed and well-nourished.  Neck: No JVD present.    Cardiovascular: Normal rate, regular rhythm, normal heart sounds and intact distal pulses.  Pulmonary/Chest: Effort normal and breath sounds normal.  Musculoskeletal: She exhibits edema.  Neurological: She is alert and oriented to person, place, and time.  Skin: No rash noted.  Psychiatric: She has a normal mood and affect. Her behavior is normal.  Vitals reviewed.   Lab Results  Component Value Date   WBC 4.8 10/21/2017   HGB 12.0 10/21/2017   HCT 36.1 10/21/2017   PLT 261.0 10/21/2017   GLUCOSE 88 10/21/2017   CHOL 183 10/21/2017   TRIG 78.0 10/21/2017   HDL 46.50 10/21/2017   LDLCALC 121 (H) 10/21/2017   ALT 13 01/06/2017   AST 14 01/06/2017   NA 140 10/21/2017   K 3.4 (L) 10/21/2017   CL 104 10/21/2017   CREATININE 0.71 10/21/2017   BUN 11 10/21/2017   CO2 29 10/21/2017   TSH 1.77 10/20/2015   INR 1.0 12/11/2007   HGBA1C 5.9 10/21/2017    Mm Screening Breast Tomo Bilateral  Result Date: 01/31/2017 CLINICAL DATA:  Screening. EXAM: 2D DIGITAL SCREENING BILATERAL MAMMOGRAM WITH CAD AND ADJUNCT TOMO COMPARISON:  Previous exam(s). ACR Breast Density Category c: The breast tissue is heterogeneously dense, which may obscure small masses. FINDINGS: There are no findings suspicious for malignancy. Images were processed with CAD. IMPRESSION: No mammographic evidence of malignancy. A result letter of  this screening mammogram will be mailed directly to the patient. RECOMMENDATION: Screening mammogram in one year. (Code:SM-B-01Y) BI-RADS CATEGORY  1: Negative. Electronically Signed   By: Abelardo Diesel M.D.   On: 01/31/2017 14:51    Assessment & Plan:   Jennifer [name] was seen today for follow-up.  Diagnoses and all orders for this visit:  Essential hypertension -     Basic metabolic panel -     CBC -     amLODipine (NORVASC) 10 MG tablet; Take 1 tablet (10 mg total) by mouth at bedtime. -     olmesartan (BENICAR) 5 MG tablet; Take 2 tablets (10 mg total) by mouth  daily.  Hyperglycemia -     Hemoglobin A1c  Encounter for lipid screening for cardiovascular disease -     Lipid panel  Colon cancer screening -     Cancel: Ambulatory referral to Gastroenterology   I am having Jennifer [name] maintain her multivitamin with minerals, carvedilol, amLODipine, and olmesartan.  Meds ordered this encounter  Medications  . amLODipine (NORVASC) 10 MG tablet    Sig: Take 1 tablet (10 mg total) by mouth at bedtime.    Dispense:  90 tablet    Refill:  3    Order Specific Question:   Supervising Provider    Answer:   Lucille Passy [[id number]]  . olmesartan (BENICAR) 5 MG tablet    Sig: Take 2 tablets (10 mg total) by mouth daily.    Dispense:  180 tablet    Refill:  3    Order Specific Question:   Supervising Provider    Answer:   Lucille Passy [[id number]]    Follow-up: Return in about 6 months (around 04/21/2018) for HTN.  Wilfred Lacy, NP

## 2017-11-11 IMAGING — DX DG CHEST 2V
2 series · 2 of 2 positions shown · non-contrast
Comparison: Chest radiograph 12/17/2011

CLINICAL DATA: Fluttering, emesis and diaphoresis.

EXAM:
CHEST  2 VIEW

[w chest pa]
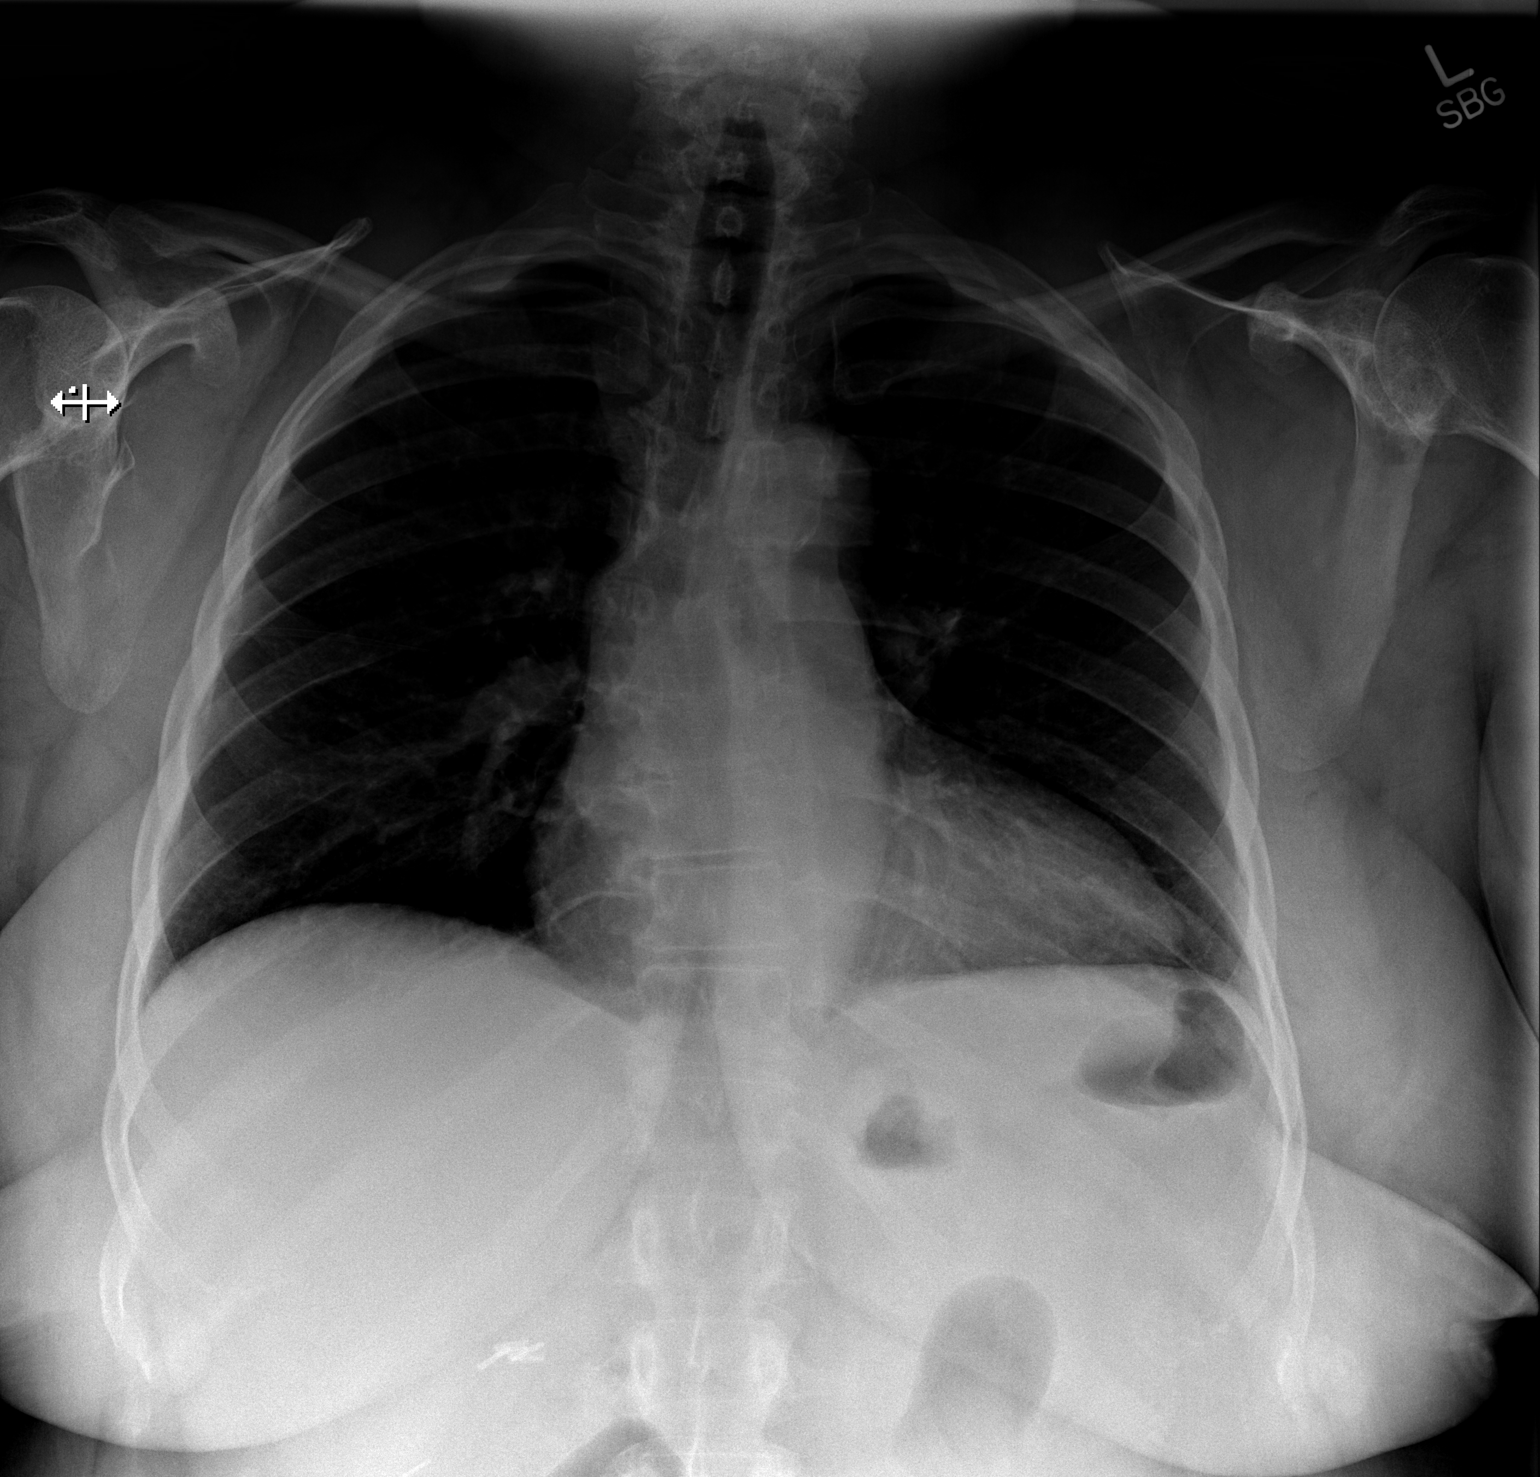

[w chest lat]
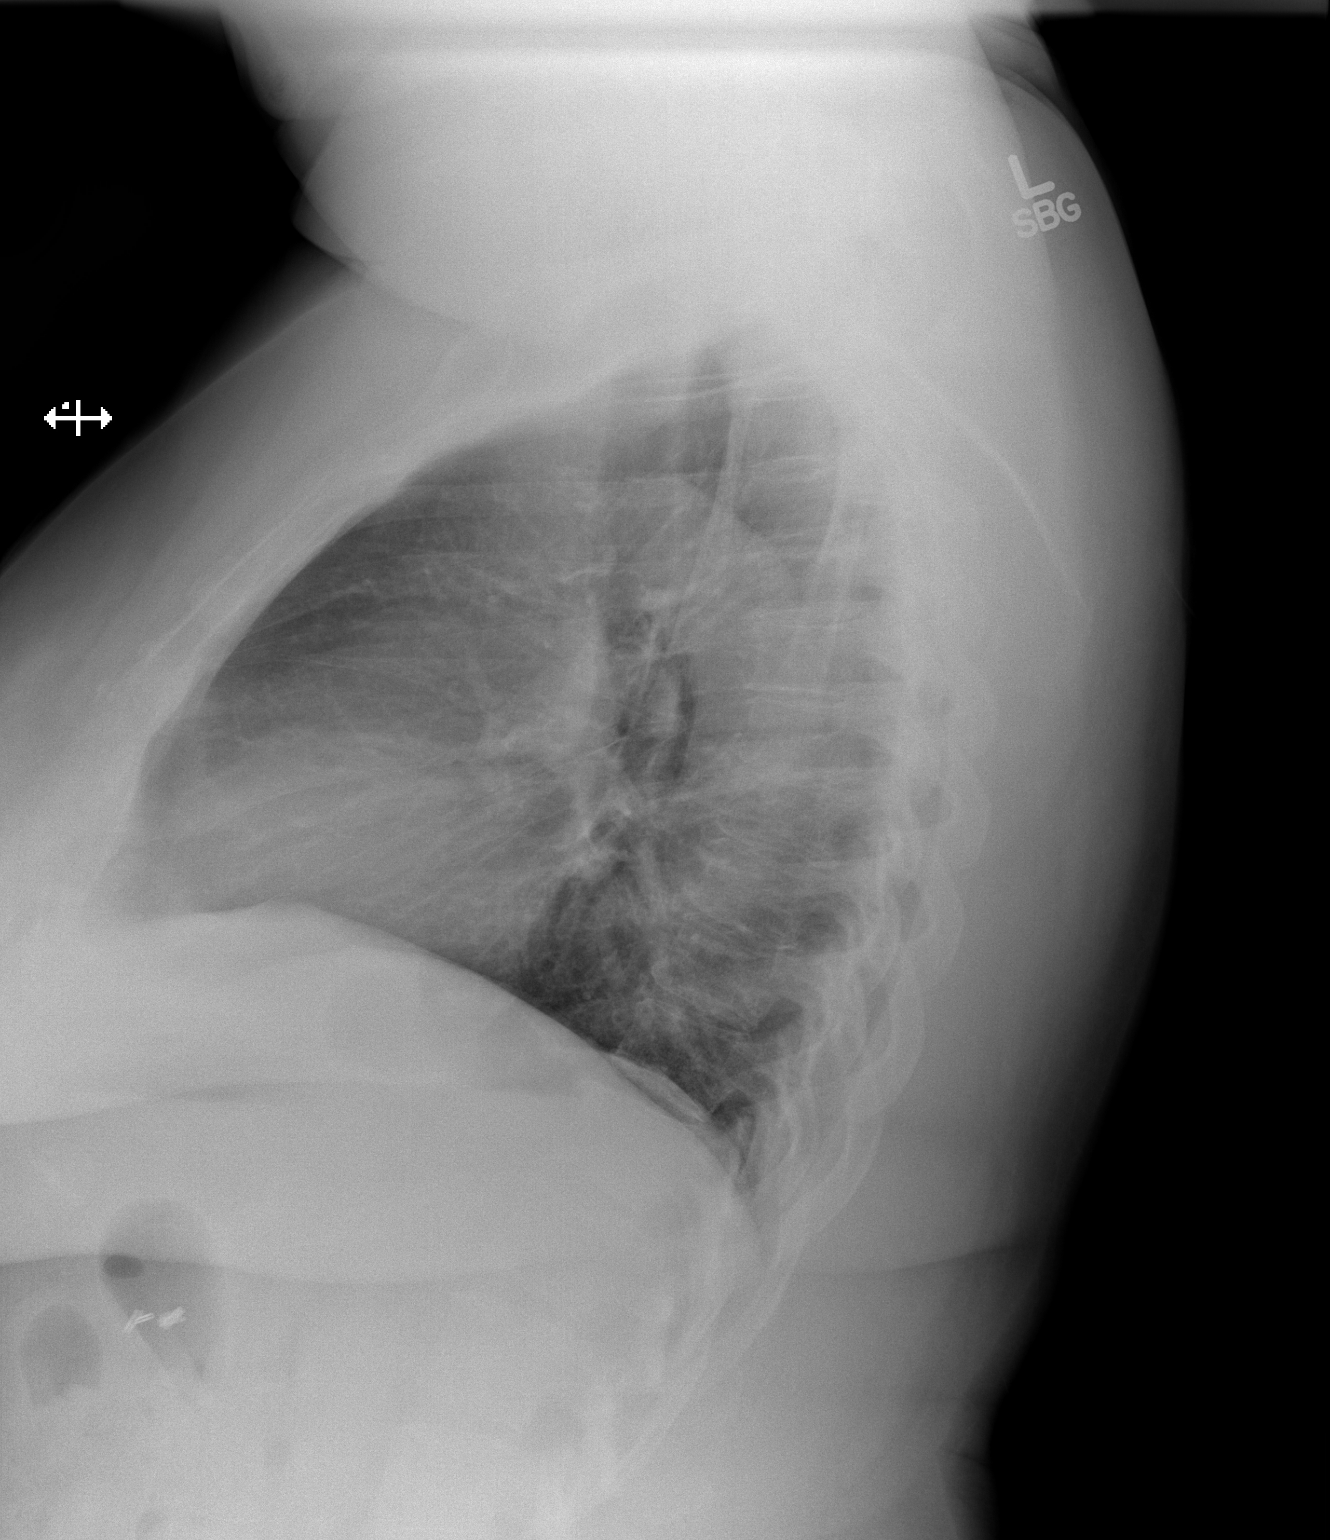

[2 of 2 positions shown; findings below may reference images not displayed]

FINDINGS: Mild enlargement of the cardiac silhouette. Torturous aorta.
Mediastinal contours appear intact.

There is no evidence of focal airspace consolidation, pleural
effusion or pneumothorax.

Osseous structures are without acute abnormality. Soft tissues are
grossly normal.
IMPRESSION: Mild enlargement of the cardiac silhouette.

No focal airspace consolidation or pulmonary edema.

## 2017-11-19 ENCOUNTER — Encounter: Payer: Self-pay | Admitting: Gastroenterology

## 2017-11-28 ENCOUNTER — Other Ambulatory Visit: Payer: Self-pay | Admitting: Nurse Practitioner

## 2017-11-28 DIAGNOSIS — I10 Essential (primary) hypertension: Secondary | ICD-10-CM

## 2017-11-28 MED FILL — AMLODIPINE BESYLATE 10 MG T: 10 | 30 days supply | Qty: 30 | Fill #1

## 2017-12-01 MED FILL — CARVEDILOL 25 MG TABLET: 25 | 30 days supply | Qty: 60 | Fill #0

## 2017-12-16 ENCOUNTER — Encounter: Payer: Self-pay | Admitting: Gastroenterology

## 2017-12-16 ENCOUNTER — Ambulatory Visit (AMBULATORY_SURGERY_CENTER): Payer: Self-pay | Admitting: *Deleted

## 2017-12-16 VITALS — Ht 63.0 in | Wt 255.0 lb

## 2017-12-16 DIAGNOSIS — Z1211 Encounter for screening for malignant neoplasm of colon: Secondary | ICD-10-CM

## 2017-12-16 MED ORDER — PEG-KCL-NACL-NASULF-NA ASC-C 140 G PO SOLR: 1.0000 | ORAL | 0 refills | Status: DC

## 2017-12-16 MED FILL — PLENVU 140 GM SOLR: 140 | 1 days supply | Qty: 3 | Fill #0

## 2017-12-16 NOTE — Progress Notes (Signed)
No egg or soy allergy known to patient  No issues with past sedation with any surgeries  or procedures, no intubation problems  No diet pills per patient No home 02 use per patient  No blood thinners per patient  Pt denies issues with constipation  No A fib or A flutter  EMMI video sent to pt's e mail  [employer] Coupon to pt in [place] today

## 2017-12-26 ENCOUNTER — Ambulatory Visit (AMBULATORY_SURGERY_CENTER): Payer: Managed Care, Other (non HMO) | Admitting: Gastroenterology

## 2017-12-26 ENCOUNTER — Encounter: Payer: Self-pay | Admitting: Gastroenterology

## 2017-12-26 VITALS — BP 141/93 | HR 72 | Temp 98.7°F | Resp 18 | Ht 62.0 in | Wt 252.0 lb

## 2017-12-26 DIAGNOSIS — K635 Polyp of colon: Secondary | ICD-10-CM | POA: Diagnosis not present

## 2017-12-26 DIAGNOSIS — Z1211 Encounter for screening for malignant neoplasm of colon: Secondary | ICD-10-CM | POA: Diagnosis not present

## 2017-12-26 DIAGNOSIS — D125 Benign neoplasm of sigmoid colon: Secondary | ICD-10-CM

## 2017-12-26 MED ORDER — SODIUM CHLORIDE 0.9 % IV SOLN: 500.0000 mL | Freq: Once | INTRAVENOUS | Status: DC

## 2017-12-26 NOTE — Progress Notes (Signed)
Called to room to assist during endoscopic procedure.  Patient ID and intended procedure confirmed with present staff. Received instructions for my participation in the procedure from the performing physician.  

## 2017-12-26 NOTE — Patient Instructions (Signed)
Thank you for allowing [place] to care for you today!  Await pathology results by mail, approx 10 days.  Next colonoscopy will be determined at that time.  Resume previous diet and medications today.  Return to normal activities tomorrow.    YOU HAD AN ENDOSCOPIC PROCEDURE TODAY AT [facility]:   Refer to the procedure report that was given to you for any specific questions about what was found during the examination.  If the procedure report does not answer your questions, please call your gastroenterologist to clarify.  If you requested that your care partner not be given the details of your procedure findings, then the procedure report has been included in a sealed envelope for you to review at your convenience later.  YOU SHOULD EXPECT: Some feelings of bloating in the abdomen. Passage of more gas than usual.  Walking can help get rid of the air that was put into your GI tract during the procedure and reduce the bloating. If you had a lower endoscopy (such as a colonoscopy or flexible sigmoidoscopy) you may notice spotting of blood in your stool or on the toilet paper. If you underwent a bowel prep for your procedure, you may not have a normal bowel movement for a few days.  Please Note:  You might notice some irritation and congestion in your nose or some drainage.  This is from the oxygen used during your procedure.  There is no need for concern and it should clear up in a day or so.  SYMPTOMS TO REPORT IMMEDIATELY:   Following lower endoscopy (colonoscopy or flexible sigmoidoscopy):  Excessive amounts of blood in the stool  Significant tenderness or worsening of abdominal pains  Swelling of the abdomen that is new, acute  Fever of 100F or higher    For urgent or emergent issues, a gastroenterologist can be reached at any hour by calling [phone].   DIET:  We do recommend a small meal at first, but then you may proceed to your regular diet.  Drink plenty of  fluids but you should avoid alcoholic beverages for 24 hours.  ACTIVITY:  You should plan to take it easy for the rest of today and you should NOT DRIVE or use heavy machinery until tomorrow (because of the sedation medicines used during the test).    FOLLOW UP: Our staff will call the number listed on your records the next business day following your procedure to check on you and address any questions or concerns that you may have regarding the information given to you following your procedure. If we do not reach you, we will leave a message.  However, if you are feeling well and you are not experiencing any problems, there is no need to return our call.  We will assume that you have returned to your regular daily activities without incident.  If any biopsies were taken you will be contacted by phone or by letter within the next 1-3 weeks.  Please call us at [phone] if you have not heard about the biopsies in 3 weeks.    SIGNATURES/CONFIDENTIALITY: You and/or your care partner have signed paperwork which will be entered into your electronic medical record.  These signatures attest to the fact that that the information above on your After Visit Summary has been reviewed and is understood.  Full responsibility of the confidentiality of this discharge information lies with you and/or your care-partner.

## 2017-12-26 NOTE — Progress Notes (Signed)
Pt's states no medical or surgical changes since previsit or office visit. 

## 2017-12-26 NOTE — Progress Notes (Signed)
Report given to PACU, vss 

## 2017-12-26 NOTE — Op Note (Signed)
[facility] Patient Name: Jennifer [name] Procedure Date: 12/26/2017 2:36 PM MRN: [id number] Endoscopist: Mallie Mussel L. Loletha Carrow , MD Age: 57 Referring MD:  Date of Birth: January 01, 1961 Gender: Female Account #: [id number] Procedure:                Colonoscopy Indications:              Screening for colorectal malignant neoplasm (no                            polyps 06/2007 screening colonoscopy) Medicines:                Monitored Anesthesia Care Procedure:                Pre-Anesthesia Assessment:                           - Prior to the procedure, a History and Physical                            was performed, and patient medications and                            allergies were reviewed. The patient's tolerance of                            previous anesthesia was also reviewed. The risks                            and benefits of the procedure and the sedation                            options and risks were discussed with the patient.                            All questions were answered, and informed consent                            was obtained. Prior Anticoagulants: The patient has                            taken no previous anticoagulant or antiplatelet                            agents. ASA Grade Assessment: II - A patient with                            mild systemic disease. After reviewing the risks                            and benefits, the patient was deemed in                            satisfactory condition to undergo the procedure.  After obtaining informed consent, the colonoscope                            was passed under direct vision. Throughout the                            procedure, the patient's blood pressure, pulse, and                            oxygen saturations were monitored continuously. The                            Colonoscope was introduced through the anus and                            advanced to the the  cecum, identified by                            appendiceal orifice and ileocecal valve. The                            colonoscopy was somewhat difficult due to                            significant looping. Successful completion of the                            procedure was aided by changing the patient's                            position and using manual pressure. The patient                            tolerated the procedure well. The quality of the                            bowel preparation was good. The ileocecal valve,                            appendiceal orifice, and rectum were photographed. Scope In: 2:47:17 PM Scope Out: 3:06:29 PM Scope Withdrawal Time: 0 hours 8 minutes 10 seconds  Total Procedure Duration: 0 hours 19 minutes 12 seconds  Findings:                 The perianal and digital rectal examinations were                            normal.                           A 3 mm polyp was found in the distal sigmoid colon.                            The polyp was sessile. The polyp was removed with a  cold biopsy forceps. Resection and retrieval were                            complete.                           The exam was otherwise without abnormality on                            direct and retroflexion views. Complications:            No immediate complications. Estimated Blood Loss:     Estimated blood loss was minimal. Impression:               - One 3 mm polyp in the distal sigmoid colon,                            removed with a cold biopsy forceps. Resected and                            retrieved.                           - The examination was otherwise normal on direct                            and retroflexion views. Recommendation:           - Patient has a contact number available for                            emergencies. The signs and symptoms of potential                            delayed complications were discussed  with the                            patient. Return to normal activities tomorrow.                            Written discharge instructions were provided to the                            patient.                           - Resume previous diet.                           - Continue present medications.                           - Await pathology results.                           - Repeat colonoscopy is recommended for  surveillance. The colonoscopy date will be                            determined after pathology results from today's                            exam become available for review. Carey Johndrow L. Loletha Carrow, MD 12/26/2017 3:11:58 PM This report has been signed electronically.

## 2017-12-29 ENCOUNTER — Telehealth: Payer: Self-pay | Admitting: *Deleted

## 2017-12-29 ENCOUNTER — Telehealth: Payer: Self-pay

## 2017-12-29 NOTE — Telephone Encounter (Signed)
First follow up call attempt.  Voicemail with first name identifier.  Message left to call if any questions or concerns.

## 2017-12-29 NOTE — Telephone Encounter (Signed)
Attempted to reach pt. With follow-up call following endoscopic procedure 12/26/2017.  LM on pt. Voice mail to call if she has any questions or concerns.

## 2018-01-05 ENCOUNTER — Encounter: Payer: Self-pay | Admitting: Gastroenterology

## 2018-01-07 MED FILL — CARVEDILOL 25 MG TABLET: 25 | 30 days supply | Qty: 60 | Fill #1

## 2018-01-07 MED FILL — AMLODIPINE BESYLATE 10 MG T: 10 | 30 days supply | Qty: 30 | Fill #2

## 2018-01-26 ENCOUNTER — Encounter: Payer: Self-pay | Admitting: Nurse Practitioner

## 2018-01-26 ENCOUNTER — Ambulatory Visit: Payer: Managed Care, Other (non HMO) | Admitting: Nurse Practitioner

## 2018-01-26 VITALS — BP 120/82 | HR 75 | Temp 98.4°F | Ht 62.0 in | Wt 248.0 lb

## 2018-01-26 DIAGNOSIS — J209 Acute bronchitis, unspecified: Secondary | ICD-10-CM

## 2018-01-26 DIAGNOSIS — J0140 Acute pansinusitis, unspecified: Secondary | ICD-10-CM

## 2018-01-26 MED ORDER — CEFDINIR 300 MG PO CAPS: 300.0000 mg | ORAL_CAPSULE | Freq: Two times a day (BID) | ORAL | 0 refills | Status: DC

## 2018-01-26 MED ORDER — FLUTICASONE PROPIONATE 50 MCG/ACT NA SUSP: 2.0000 | Freq: Every day | NASAL | 0 refills | Status: DC

## 2018-01-26 MED ORDER — BENZONATATE 100 MG PO CAPS: 100.0000 mg | ORAL_CAPSULE | Freq: Three times a day (TID) | ORAL | 0 refills | Status: DC | PRN

## 2018-01-26 MED ORDER — GUAIFENESIN-DM 100-10 MG/5ML PO SYRP: 5.0000 mL | ORAL_SOLUTION | ORAL | 0 refills | Status: DC | PRN

## 2018-01-26 MED ORDER — ALBUTEROL SULFATE 108 (90 BASE) MCG/ACT IN AEPB: 1.0000 | INHALATION_SPRAY | Freq: Four times a day (QID) | RESPIRATORY_TRACT | 0 refills | Status: DC | PRN

## 2018-01-26 MED ORDER — METHYLPREDNISOLONE ACETATE 40 MG/ML IJ SUSP
40.0000 mg | Freq: Once | INTRAMUSCULAR | Status: AC
  Administered 2018-01-26: 40 mg via INTRAMUSCULAR

## 2018-01-26 MED FILL — PROAIR RESPICLICK INHAL PWD: 108 (90 BAS | 30 days supply | Qty: 1 | Fill #0

## 2018-01-26 MED FILL — BENZONATATE 100 MG CAP: 100 | 6 days supply | Qty: 20 | Fill #0

## 2018-01-26 MED FILL — FLUTICASONE PROP 50 MCG SPR: 50 | 30 days supply | Qty: 16 | Fill #0

## 2018-01-26 MED FILL — CEFDINIR 300 MG CAPSULE: 300 | 7 days supply | Qty: 14 | Fill #0

## 2018-01-26 NOTE — Patient Instructions (Signed)
maintain adequate oral hydration.  Alternate between benzonatate and robitussin for cough.  Avoid OTC decongestants.

## 2018-01-26 NOTE — Progress Notes (Signed)
Subjective:  Patient ID: Jennifer [name], female    DOB: Dec 24, 1960  Age: 57 y.o. MRN: [id number]  CC: Cough (coughing yellow/brown mucus,chest congetion,worse at night/going on 1 wk/took otc: nyquil,etc)   Cough  This is a new problem. The current episode started in the past 7 days. The problem has been gradually worsening. The problem occurs constantly. The cough is productive of purulent sputum. Associated symptoms include chills, ear pain, headaches, myalgias, nasal congestion, postnasal drip, rhinorrhea and a sore throat. Pertinent negatives include no fever, heartburn, shortness of breath, sweats or wheezing. The symptoms are aggravated by lying down and cold air. She has tried OTC cough suppressant for the symptoms. The treatment provided mild relief.   Reviewed past Medical, Social and Family history today.  Outpatient Medications Prior to Visit  Medication Sig Dispense Refill  . amLODipine (NORVASC) 10 MG tablet Take 1 tablet (10 mg total) by mouth at bedtime. 90 tablet 3  . carvedilol (COREG) 25 MG tablet TAKE 1 TABLET BY MOUTH TWICE A DAY WITH MEALS 180 tablet 1  . Multiple Vitamin (MULTIVITAMIN WITH MINERALS) TABS tablet Take 1 tablet by mouth at bedtime. ALIVE multi-vitamin    . OVER THE COUNTER MEDICATION Herbal Tea daily    . olmesartan (BENICAR) 5 MG tablet Take 2 tablets (10 mg total) by mouth daily. (Patient not taking: Reported on 12/16/2017) 180 tablet 3   No facility-administered medications prior to visit.     ROS See HPI  Objective:  BP 120/82   Pulse 75   Temp 98.4 F (36.9 C) (Oral)   Ht 5\' 2"  (1.575 m)   Wt 248 lb (112.5 kg)   SpO2 96%   BMI 45.36 kg/m   BP Readings from Last 3 Encounters:  01/26/18 120/82  12/26/17 (!) 141/93  10/21/17 (!) 142/80    Wt Readings from Last 3 Encounters:  01/26/18 248 lb (112.5 kg)  12/26/17 252 lb (114.3 kg)  12/16/17 255 lb (115.7 kg)    Physical Exam Vitals signs reviewed.  Constitutional:    Appearance: She is obese.  HENT:     Right Ear: Ear canal and external ear normal. A middle ear effusion is present.     Left Ear: Ear canal and external ear normal. A middle ear effusion is present.     Nose: Mucosal edema, congestion and rhinorrhea present.     Right Turbinates: Swollen.     Left Turbinates: Swollen.     Right Sinus: Maxillary sinus tenderness and frontal sinus tenderness present.     Left Sinus: Maxillary sinus tenderness and frontal sinus tenderness present.     Mouth/Throat:     Pharynx: Posterior oropharyngeal erythema present. No oropharyngeal exudate or uvula swelling.  Cardiovascular:     Rate and Rhythm: Normal rate and regular rhythm.  Pulmonary:     Effort: Pulmonary effort is normal.     Breath sounds: Normal breath sounds.  Lymphadenopathy:     Cervical: Cervical adenopathy present.  Neurological:     Mental Status: She is alert.     Lab Results  Component Value Date   WBC 4.8 10/21/2017   HGB 12.0 10/21/2017   HCT 36.1 10/21/2017   PLT 261.0 10/21/2017   GLUCOSE 88 10/21/2017   CHOL 183 10/21/2017   TRIG 78.0 10/21/2017   HDL 46.50 10/21/2017   LDLCALC 121 (H) 10/21/2017   ALT 13 01/06/2017   AST 14 01/06/2017   NA 140 10/21/2017   K 3.4 (L) 10/21/2017  CL 104 10/21/2017   CREATININE 0.71 10/21/2017   BUN 11 10/21/2017   CO2 29 10/21/2017   TSH 1.77 10/20/2015   INR 1.0 12/11/2007   HGBA1C 5.9 10/21/2017    Mm Screening Breast Tomo Bilateral  Result Date: 01/31/2017 CLINICAL DATA:  Screening. EXAM: 2D DIGITAL SCREENING BILATERAL MAMMOGRAM WITH CAD AND ADJUNCT TOMO COMPARISON:  Previous exam(s). ACR Breast Density Category c: The breast tissue is heterogeneously dense, which may obscure small masses. FINDINGS: There are no findings suspicious for malignancy. Images were processed with CAD. IMPRESSION: No mammographic evidence of malignancy. A result letter of this screening mammogram will be mailed directly to the patient.  RECOMMENDATION: Screening mammogram in one year. (Code:SM-B-01Y) BI-RADS CATEGORY  1: Negative. Electronically Signed   By: Abelardo Diesel M.D.   On: 01/31/2017 14:51    Assessment & Plan:   Jacobi was seen today for cough.  Diagnoses and all orders for this visit:  Acute non-recurrent pansinusitis -     methylPREDNISolone acetate (DEPO-MEDROL) injection 40 mg -     guaiFENesin-dextromethorphan (ROBITUSSIN DM) 100-10 MG/5ML syrup; Take 5 mLs by mouth every 4 (four) hours as needed for cough. -     fluticasone (FLONASE) 50 MCG/ACT nasal spray; Place 2 sprays into both nostrils daily. -     benzonatate (TESSALON) 100 MG capsule; Take 1 capsule (100 mg total) by mouth 3 (three) times daily as needed for cough. -     cefdinir (OMNICEF) 300 MG capsule; Take 1 capsule (300 mg total) by mouth 2 (two) times daily.  Acute bronchitis, unspecified organism -     methylPREDNISolone acetate (DEPO-MEDROL) injection 40 mg -     guaiFENesin-dextromethorphan (ROBITUSSIN DM) 100-10 MG/5ML syrup; Take 5 mLs by mouth every 4 (four) hours as needed for cough. -     fluticasone (FLONASE) 50 MCG/ACT nasal spray; Place 2 sprays into both nostrils daily. -     benzonatate (TESSALON) 100 MG capsule; Take 1 capsule (100 mg total) by mouth 3 (three) times daily as needed for cough. -     cefdinir (OMNICEF) 300 MG capsule; Take 1 capsule (300 mg total) by mouth 2 (two) times daily. -     Albuterol Sulfate (PROAIR RESPICLICK) 003 (90 Base) MCG/ACT AEPB; Inhale 1 puff into the lungs every 6 (six) hours as needed.   I am having Jennifer [name] start on guaiFENesin-dextromethorphan, fluticasone, benzonatate, cefdinir, and Albuterol Sulfate. I am also having her maintain her multivitamin with minerals, amLODipine, olmesartan, carvedilol, and OVER THE COUNTER MEDICATION. We administered methylPREDNISolone acetate.  Meds ordered this encounter  Medications  . methylPREDNISolone acetate (DEPO-MEDROL) injection 40 mg  .  guaiFENesin-dextromethorphan (ROBITUSSIN DM) 100-10 MG/5ML syrup    Sig: Take 5 mLs by mouth every 4 (four) hours as needed for cough.    Dispense:  118 mL    Refill:  0    Order Specific Question:   Supervising Provider    Answer:   Lucille Passy [[id number]]  . fluticasone (FLONASE) 50 MCG/ACT nasal spray    Sig: Place 2 sprays into both nostrils daily.    Dispense:  16 g    Refill:  0    Order Specific Question:   Supervising Provider    Answer:   Lucille Passy [[id number]]  . benzonatate (TESSALON) 100 MG capsule    Sig: Take 1 capsule (100 mg total) by mouth 3 (three) times daily as needed for cough.    Dispense:  20 capsule  Refill:  0    Order Specific Question:   Supervising Provider    Answer:   Lucille Passy [[id number]]  . cefdinir (OMNICEF) 300 MG capsule    Sig: Take 1 capsule (300 mg total) by mouth 2 (two) times daily.    Dispense:  14 capsule    Refill:  0    Order Specific Question:   Supervising Provider    Answer:   Lucille Passy [[id number]]  . Albuterol Sulfate (PROAIR RESPICLICK) 735 (90 Base) MCG/ACT AEPB    Sig: Inhale 1 puff into the lungs every 6 (six) hours as needed.    Dispense:  1 each    Refill:  0    Order Specific Question:   Supervising Provider    Answer:   Lucille Passy [[id number]]    Problem List Items Addressed This Visit    None    Visit Diagnoses    Acute non-recurrent pansinusitis    -  Primary   Relevant Medications   methylPREDNISolone acetate (DEPO-MEDROL) injection 40 mg (Completed)   guaiFENesin-dextromethorphan (ROBITUSSIN DM) 100-10 MG/5ML syrup   fluticasone (FLONASE) 50 MCG/ACT nasal spray   benzonatate (TESSALON) 100 MG capsule   cefdinir (OMNICEF) 300 MG capsule   Acute bronchitis, unspecified organism       Relevant Medications   methylPREDNISolone acetate (DEPO-MEDROL) injection 40 mg (Completed)   guaiFENesin-dextromethorphan (ROBITUSSIN DM) 100-10 MG/5ML syrup   fluticasone (FLONASE) 50 MCG/ACT nasal spray   benzonatate (TESSALON) 100 MG  capsule   cefdinir (OMNICEF) 300 MG capsule   Albuterol Sulfate (PROAIR RESPICLICK) 670 (90 Base) MCG/ACT AEPB       Follow-up: Return if symptoms worsen or fail to improve.  Wilfred Lacy, NP

## 2018-02-10 MED FILL — AMLODIPINE BESYLATE 10 MG T: 10 | 30 days supply | Qty: 30 | Fill #3

## 2018-02-10 MED FILL — CARVEDILOL 25 MG TABLET: 25 | 30 days supply | Qty: 60 | Fill #2

## 2018-03-23 MED FILL — AMLODIPINE BESYLATE 10 MG T: 10 | 30 days supply | Qty: 30 | Fill #4

## 2018-03-23 MED FILL — CARVEDILOL 25 MG TABLET: 25 | 30 days supply | Qty: 60 | Fill #3

## 2018-04-03 ENCOUNTER — Other Ambulatory Visit (HOSPITAL_BASED_OUTPATIENT_CLINIC_OR_DEPARTMENT_OTHER): Payer: Self-pay | Admitting: Nurse Practitioner

## 2018-04-03 DIAGNOSIS — Z1231 Encounter for screening mammogram for malignant neoplasm of breast: Secondary | ICD-10-CM

## 2018-04-10 ENCOUNTER — Ambulatory Visit (HOSPITAL_BASED_OUTPATIENT_CLINIC_OR_DEPARTMENT_OTHER)
Admission: RE | Admit: 2018-04-10 | Discharge: 2018-04-10 | Disposition: A | Discharge status: Home / Self Care | Payer: Managed Care, Other (non HMO) | Source: Ambulatory Visit | Attending: Nurse Practitioner | Admitting: Nurse Practitioner

## 2018-04-10 DIAGNOSIS — Z1231 Encounter for screening mammogram for malignant neoplasm of breast: Secondary | ICD-10-CM | POA: Diagnosis not present

## 2018-04-21 ENCOUNTER — Ambulatory Visit (INDEPENDENT_AMBULATORY_CARE_PROVIDER_SITE_OTHER): Payer: Managed Care, Other (non HMO) | Admitting: Nurse Practitioner

## 2018-04-21 ENCOUNTER — Encounter: Payer: Self-pay | Admitting: Nurse Practitioner

## 2018-04-21 VITALS — BP 124/86 | HR 62 | Temp 97.7°F | Ht 61.81 in | Wt 244.8 lb

## 2018-04-21 DIAGNOSIS — I10 Essential (primary) hypertension: Secondary | ICD-10-CM

## 2018-04-21 DIAGNOSIS — J302 Other seasonal allergic rhinitis: Secondary | ICD-10-CM

## 2018-04-21 DIAGNOSIS — E7800 Pure hypercholesterolemia, unspecified: Secondary | ICD-10-CM

## 2018-04-21 DIAGNOSIS — R739 Hyperglycemia, unspecified: Secondary | ICD-10-CM

## 2018-04-21 LAB — BASIC METABOLIC PANEL
BUN: 10 mg/dL (ref 6–23)
CO2: 25 mEq/L (ref 19–32)
Calcium: 9.4 mg/dL (ref 8.4–10.5)
Chloride: 104 mEq/L (ref 96–112)
Creatinine, Ser: 0.68 mg/dL (ref 0.40–1.20)
GFR: 107.03 mL/min (ref >60.00)
Glucose, Bld: 95 mg/dL (ref 70–99)
POTASSIUM: 3.8 meq/L (ref 3.5–5.1)
Sodium: 139 mEq/L (ref 135–145)

## 2018-04-21 LAB — HEMOGLOBIN A1C: Hgb A1c MFr Bld: 5.7 % (ref 4.6–6.5)

## 2018-04-21 LAB — LIPID PANEL
CHOL/HDL RATIO: 3
Cholesterol: 191 mg/dL (ref 0–200)
HDL: 55.1 mg/dL (ref >39.00)
LDL Cholesterol: 125 mg/dL — ABNORMAL HIGH (ref 0–99)
NonHDL: 135.54
Triglycerides: 51 mg/dL (ref 0.0–149.0)
VLDL: 10.2 mg/dL (ref 0.0–40.0)

## 2018-04-21 MED ORDER — FLUTICASONE PROPIONATE 50 MCG/ACT NA SUSP: 2.0000 | Freq: Every day | NASAL | 5 refills | Status: DC

## 2018-04-21 MED ORDER — AMLODIPINE BESYLATE 10 MG PO TABS: 10.0000 mg | ORAL_TABLET | Freq: Every day | ORAL | 3 refills | Status: DC

## 2018-04-21 MED ORDER — CARVEDILOL 25 MG PO TABS: ORAL_TABLET | ORAL | 1 refills | Status: DC

## 2018-04-21 MED FILL — FLUTICASONE PROP 50 MCG SPR: 50 | 30 days supply | Qty: 16 | Fill #0

## 2018-04-21 MED FILL — AMLODIPINE BESYLATE 10 MG T: 10 | 30 days supply | Qty: 30 | Fill #0

## 2018-04-21 MED FILL — CARVEDILOL 25 MG TABLET: 25 | 30 days supply | Qty: 60 | Fill #0

## 2018-04-21 NOTE — Assessment & Plan Note (Addendum)
Elevated LDL at 121. Prediabetes with HgbA1c of 5.9 No tobacco use. 45yrs cardiovascular risk increased 5.5% to 7.5% in 60months.  Start pravastatin

## 2018-04-21 NOTE — Progress Notes (Addendum)
Subjective:  Patient ID: Jennifer [name], female    DOB: [birth year]  Age: 58 y.o. MRN: [id number]  CC: Follow-up (6wks HTN, fasting,Rx refill , doing well , R ankle pain W/AMB )  Ankle Pain   The incident occurred more than 1 week ago. There was no injury mechanism. The pain is present in the right ankle. The quality of the pain is described as aching. The pain has been fluctuating since onset. Pertinent negatives include no inability to bear weight, loss of motion, loss of sensation, muscle weakness, numbness or tingling. She reports no foreign bodies present. The symptoms are aggravated by movement and weight bearing. She has tried nothing for the symptoms.   HTN: Controlled with coreg and amlodipine. She reports she has not taken benicar for over 52months. unsure why she stopped taking medication. BP Readings from Last 3 Encounters:  04/21/18 124/86  01/26/18 120/82  12/26/17 (!) 141/93   Elevated LDL at 121. Prediabetes with HgbA1c of 5.9 No tobacco use. 53yrs cardiovascular risk of 5.5%  Reviewed past Medical, Social and Family history today.  Outpatient Medications Prior to Visit  Medication Sig Dispense Refill  . Albuterol Sulfate (PROAIR RESPICLICK) 702 (90 Base) MCG/ACT AEPB Inhale 1 puff into the lungs every 6 (six) hours as needed. 1 each 0  . Multiple Vitamin (MULTIVITAMIN WITH MINERALS) TABS tablet Take 1 tablet by mouth at bedtime. ALIVE multi-vitamin    . OVER THE COUNTER MEDICATION Herbal Tea daily    . amLODipine (NORVASC) 10 MG tablet Take 1 tablet (10 mg total) by mouth at bedtime. 90 tablet 3  . carvedilol (COREG) 25 MG tablet TAKE 1 TABLET BY MOUTH TWICE A DAY WITH MEALS 180 tablet 1  . cefdinir (OMNICEF) 300 MG capsule Take 1 capsule (300 mg total) by mouth 2 (two) times daily. 14 capsule 0  . fluticasone (FLONASE) 50 MCG/ACT nasal spray Place 2 sprays into both nostrils daily. 16 g 0  . benzonatate (TESSALON) 100 MG capsule Take 1 capsule (100 mg total) by  mouth 3 (three) times daily as needed for cough. (Patient not taking: Reported on 04/21/2018) 20 capsule 0  . guaiFENesin-dextromethorphan (ROBITUSSIN DM) 100-10 MG/5ML syrup Take 5 mLs by mouth every 4 (four) hours as needed for cough. (Patient not taking: Reported on 04/21/2018) 118 mL 0  . olmesartan (BENICAR) 5 MG tablet Take 2 tablets (10 mg total) by mouth daily. (Patient not taking: Reported on 12/16/2017) 180 tablet 3   No facility-administered medications prior to visit.     ROS See HPI  Objective:  BP 124/86 (BP Location: Left Arm, Patient Position: Sitting, Cuff Size: Large)   Pulse 62   Temp 97.7 F (36.5 C) (Oral)   Ht 5' 1.81" (1.57 m)   Wt 244 lb 12.8 oz (111 kg)   SpO2 98%   BMI 45.05 kg/m   BP Readings from Last 3 Encounters:  04/21/18 124/86  01/26/18 120/82  12/26/17 (!) 141/93    Wt Readings from Last 3 Encounters:  04/21/18 244 lb 12.8 oz (111 kg)  01/26/18 248 lb (112.5 kg)  12/26/17 252 lb (114.3 kg)    Physical Exam Vitals signs reviewed.  Cardiovascular:     Rate and Rhythm: Normal rate and regular rhythm.     Pulses: Normal pulses.     Heart sounds: Normal heart sounds.  Pulmonary:     Effort: Pulmonary effort is normal.     Breath sounds: Normal breath sounds.  Musculoskeletal:  General: Tenderness present. No swelling.     Right ankle: She exhibits normal range of motion, no swelling and normal pulse. Tenderness. Posterior TFL tenderness found. No lateral malleolus, no medial malleolus, no AITFL, no CF ligament, no head of 5th metatarsal and no proximal fibula tenderness found. Achilles tendon normal.     Right lower leg: Normal. No edema.     Left lower leg: No edema.     Right foot: Normal.  Neurological:     Mental Status: She is alert and oriented to person, place, and time.     Lab Results  Component Value Date   WBC 4.8 10/21/2017   HGB 12.0 10/21/2017   HCT 36.1 10/21/2017   PLT 261.0 10/21/2017   GLUCOSE 95 04/21/2018     CHOL 191 04/21/2018   TRIG 51.0 04/21/2018   HDL 55.10 04/21/2018   LDLCALC 125 (H) 04/21/2018   ALT 13 01/06/2017   AST 14 01/06/2017   NA 139 04/21/2018   K 3.8 04/21/2018   CL 104 04/21/2018   CREATININE 0.68 04/21/2018   BUN 10 04/21/2018   CO2 25 04/21/2018   TSH 1.77 10/20/2015   INR 1.0 12/11/2007   HGBA1C 5.7 04/21/2018    Mm 3d Screen Breast Bilateral  Result Date: 04/10/2018 CLINICAL DATA:  Screening. EXAM: DIGITAL SCREENING BILATERAL MAMMOGRAM WITH TOMO AND CAD COMPARISON:  Previous exam(s). ACR Breast Density Category c: The breast tissue is heterogeneously dense, which may obscure small masses. FINDINGS: There are no findings suspicious for malignancy. Images were processed with CAD. IMPRESSION: No mammographic evidence of malignancy. A result letter of this screening mammogram will be mailed directly to the patient. RECOMMENDATION: Screening mammogram in one year. (Code:SM-B-01Y) BI-RADS CATEGORY  1: Negative. Electronically Signed   By: Lillia Mountain M.D.   On: 04/10/2018 16:15    Assessment & Plan:   Jennifer [name] was seen today for follow-up.  Diagnoses and all orders for this visit:  Essential hypertension -     Basic metabolic panel -     carvedilol (COREG) 25 MG tablet; TAKE 1 TABLET BY MOUTH TWICE A DAY WITH MEALS -     amLODipine (NORVASC) 10 MG tablet; Take 1 tablet (10 mg total) by mouth at bedtime. -     pravastatin (PRAVACHOL) 20 MG tablet; Take 1 tablet (20 mg total) by mouth daily.  Hyperglycemia -     Hemoglobin A1c -     pravastatin (PRAVACHOL) 20 MG tablet; Take 1 tablet (20 mg total) by mouth daily.  Elevated LDL cholesterol level -     Lipid panel -     pravastatin (PRAVACHOL) 20 MG tablet; Take 1 tablet (20 mg total) by mouth daily.  Seasonal allergic rhinitis, unspecified trigger -     fluticasone (FLONASE) 50 MCG/ACT nasal spray; Place 2 sprays into both nostrils daily.   I have discontinued Loriel A. Prestridge's olmesartan,  guaiFENesin-dextromethorphan, benzonatate, and cefdinir. I am also having her start on pravastatin. Additionally, I am having her maintain her multivitamin with minerals, OVER THE COUNTER MEDICATION, Albuterol Sulfate, fluticasone, carvedilol, and amLODipine.  Meds ordered this encounter  Medications  . fluticasone (FLONASE) 50 MCG/ACT nasal spray    Sig: Place 2 sprays into both nostrils daily.    Dispense:  16 g    Refill:  5    Order Specific Question:   Supervising Provider    Answer:   MATTHEWS, CODY [[id number]]  . carvedilol (COREG) 25 MG tablet    Sig: TAKE  1 TABLET BY MOUTH TWICE A DAY WITH MEALS    Dispense:  180 tablet    Refill:  1    Order Specific Question:   Supervising Provider    Answer:   MATTHEWS, CODY [[id number]]  . amLODipine (NORVASC) 10 MG tablet    Sig: Take 1 tablet (10 mg total) by mouth at bedtime.    Dispense:  90 tablet    Refill:  3    Order Specific Question:   Supervising Provider    Answer:   MATTHEWS, CODY [[id number]]  . pravastatin (PRAVACHOL) 20 MG tablet    Sig: Take 1 tablet (20 mg total) by mouth daily.    Dispense:  90 tablet    Refill:  1    Order Specific Question:   Supervising Provider    Answer:   Lucille Passy [[id number]]    Problem List Items Addressed This Visit      Cardiovascular and Mediastinum   Essential hypertension - Primary    Controlled with coreg and amlodipine. She reports she has not taken benicar for over 20months. unsure why she stopped taking medication. BP Readings from Last 3 Encounters:  04/21/18 124/86  01/26/18 120/82  12/26/17 (!) 141/93        Relevant Medications   carvedilol (COREG) 25 MG tablet   amLODipine (NORVASC) 10 MG tablet   pravastatin (PRAVACHOL) 20 MG tablet   Other Relevant Orders   Basic metabolic panel (Completed)     Respiratory   Allergic rhinitis   Relevant Medications   fluticasone (FLONASE) 50 MCG/ACT nasal spray     Other   Elevated LDL cholesterol level    Elevated LDL at 121. Prediabetes  with HgbA1c of 5.9 No tobacco use. 10yrs cardiovascular risk increased 5.5% to 7.5% in 7months.  Start pravastatin      Relevant Medications   pravastatin (PRAVACHOL) 20 MG tablet   Other Relevant Orders   Lipid panel (Completed)   Hyperglycemia   Relevant Medications   pravastatin (PRAVACHOL) 20 MG tablet   Other Relevant Orders   Hemoglobin A1c (Completed)       Follow-up: Return in about 6 months (around 10/22/2018) for HTN and , hyperlipidemia (fasting).  Wilfred Lacy, NP

## 2018-04-21 NOTE — Patient Instructions (Addendum)
Inquire from GI about time to repeat colonoscopy.  Go to lab for blood draw.  Continue to hold benicar for now, since BP is under control with amlodipine and coreg.  Use soft ankle brace and adequate shoe support to help with right ankle discomfort. Do exercise once a day, 10 repetitions.   Posterior Tibialis Tendinosis Rehab Ask your health care provider which exercises are safe for you. Do exercises exactly as told by your health care provider and adjust them as directed. It is normal to feel mild stretching, pulling, tightness, or discomfort as you do these exercises, but you should stop right away if you feel sudden pain or your pain gets worse.Do not begin these exercises until told by your health care provider. Stretching and range of motion exercises These exercises warm up your muscles and joints and improve the movement and flexibility in your ankle and foot. These exercises may also help to relieve pain. Exercise A: Standing wall calf stretch, knee straight  1. Stand with your hands against a wall. 2. Extend your __________ leg behind you, and bend your front knee slightly. Keep both of your heels on the floor. 3. Point the toes of your back foot slightly inward. 4. Keeping your heels on the floor and your back knee straight, shift your weight toward the wall. Do not allow your back to arch. You should feel a gentle stretch in the back of your lower leg (calf). 5. Hold this position for __________ seconds. Repeat __________ times. Complete this stretch __________ times a day. Exercise B: Standing wall calf stretch, knee bent 1. Stand with your hands against a wall. 2. Extend your __________ leg behind you, and bend your front knee slightly. Keep both of your heels on the floor. 3. Point the toes of your back foot slightly inward. 4. Unlock your back knee so it is bent. Keep your heels on the floor. You should feel a gentle stretch deep in your calf. 5. Hold this position for  __________ seconds. Repeat __________ times. Complete this exercise __________ times a day. Strengthening exercises These exercises build strength and endurance in your ankle and foot. Endurance is the ability to use your muscles for a long time, even after they get tired. Exercise C: Ankle inversion with band 1. Secure one end of an exercise band or tubing to a fixed object, such as a table leg or a pole, that will stay still when the band is pulled. to an object that will not move if it is pulled on, like a table leg. 2. Loop the other end of the band around the middle of your left / right foot. 3. Sit on the floor facing the object with your __________ leg extended. The band or tube should be slightly tense when your foot is relaxed. 4. Leading with your big toe, slowly bring your __________ foot and ankle inward, toward your other foot. 5. Hold this position for __________ seconds. 6. Slowly return your foot to the starting position. Repeat __________ times. Complete this exercise __________ times a day. Exercise D: Towel curls  1. Sit in a chair on a non-carpeted surface, and put your feet on the floor. 2. Place a towel in front of your feet. If told by your health care provider, add __________ at the end of the towel. 3. Keeping your heel on the floor, put your __________ foot on the towel. 4. Pull the towel toward you by grabbing the towel with your toes and curling them  under. Keep your heel on the floor. 5. Let your toes relax. 6. Grab the towel with your toes again. Keep going until the towel is completely underneath your foot. Repeat __________ times. Complete this exercise __________ times a day. Balance exercises These exercises improve or maintain your balance. Balance is important in preventing falls. Exercise E: Single leg stand 1. Without shoes, stand near a railing or in a doorway. You can hold on to the railing or door frame as needed for balance. 2. Stand on your  __________ foot. Keep your big toe down on the floor and try to keep your arch lifted. If balancing in this position is too easy, try the exercise with your eyes closed or while standing on a pillow. 3. Hold this position for __________ seconds. Repeat __________ times. Complete this exercise __________ times a day. This information is not intended to replace advice given to you by your health care provider. Make sure you discuss any questions you have with your health care provider. Document Released: 01/28/2005 Document Revised: 10/03/2015 Document Reviewed: 10/14/2014 Elsevier Interactive Patient Education  2019 [employer].

## 2018-04-21 NOTE — Assessment & Plan Note (Signed)
Controlled with coreg and amlodipine. She reports she has not taken benicar for over 41months. unsure why she stopped taking medication. BP Readings from Last 3 Encounters:  04/21/18 124/86  01/26/18 120/82  12/26/17 (!) 141/93

## 2018-04-23 MED ORDER — PRAVASTATIN SODIUM 20 MG PO TABS: 20.0000 mg | ORAL_TABLET | Freq: Every day | ORAL | 1 refills | Status: DC

## 2018-04-23 NOTE — Addendum Note (Signed)
Addended by: Wilfred Lacy L on: 04/23/2018 12:34 AM   Modules accepted: Orders, Level of Service

## 2018-05-29 ENCOUNTER — Ambulatory Visit: Payer: Self-pay

## 2018-05-29 NOTE — Telephone Encounter (Signed)
Left vm for the pt to call back, need to offer an doxy.appt with Dr. Loletha Grayer ([facility] is book today) to eval symptoms if pt is okey with this.

## 2018-05-29 NOTE — Telephone Encounter (Signed)
Monica please help follow up with the pt as well.

## 2018-05-29 NOTE — Telephone Encounter (Signed)
  Incoming call from Patient , stating her having a sensation of " chest is on fire".  Reports sneezing  Denies SOB, denies fever.  Patient states she wears a mask .  Onset was 2 days ago.   Pattern is constant.  Rates it mild.  A mild cough every now and then.  Patient states that she does have allergies sometimes.  Reviewed Protocol with Patient and provided care advice.  Patient voiced understanding.   Reason for Disposition . [1] Patient claims chest pain is same as previously diagnosed "heartburn" AND [2] describes burning in chest AND [3] accompanying sour taste in mouth  Answer Assessment - Initial Assessment Questions 1. LOCATION: "Where does it hurt?"       middle 2. RADIATION: "Does the pain go anywhere else?" (e.g., into neck, jaw, arms, back)     denies 3. ONSET: "When did the chest pain begin?" (Minutes, hours or days)      2 days ago 4. PATTERN "Does the pain come and go, or has it been constant since it started?"  "Does it get worse with exertion?"      constant 5. DURATION: "How long does it last" (e.g., seconds, minutes, hours)     *No Answer* 6. SEVERITY: "How bad is the pain?"  (e.g., Scale 1-10; mild, moderate, or severe)    - MILD (1-3): doesn't interfere with normal activities     - MODERATE (4-7): interferes with normal activities or awakens from sleep    - SEVERE (8-10): excruciating pain, unable to do any normal activities       mild 7. CARDIAC RISK FACTORS: "Do you have any history of heart problems or risk factors for heart disease?" (e.g., prior heart attack, angina; high blood pressure, diabetes, being overweight, high cholesterol, smoking, or strong family history of heart disease)     Yes,  B/p,  8. PULMONARY RISK FACTORS: "Do you have any history of lung disease?"  (e.g., blood clots in lung, asthma, emphysema, birth control pills)     Bronchial problems  9. CAUSE: "What do you think is causing the chest pain?"     *No Answer* 10. OTHER SYMPTOMS: "Do you have  any other symptoms?" (e.g., dizziness, nausea, vomiting, sweating, fever, difficulty breathing, cough)     Every now and then 11. PREGNANCY: "Is there any chance you are pregnant?" "When was your last menstrual period?"       *No Answer*  Protocols used: CHEST PAIN-A-AH

## 2018-05-29 NOTE — Telephone Encounter (Signed)
Left another vm for pt to call back.

## 2018-06-01 NOTE — Telephone Encounter (Signed)
I called and left message for patient to return call on 05/29/2018 and 06/01/2018.

## 2018-06-05 MED FILL — CARVEDILOL 25 MG TABLET: 25 | 30 days supply | Qty: 60 | Fill #1

## 2018-06-05 MED FILL — AMLODIPINE BESYLATE 10 MG T: 10 | 30 days supply | Qty: 30 | Fill #1

## 2018-06-05 MED FILL — FLUTICASONE PROP 50 MCG SPR: 50 | 30 days supply | Qty: 16 | Fill #1

## 2018-06-25 ENCOUNTER — Ambulatory Visit (INDEPENDENT_AMBULATORY_CARE_PROVIDER_SITE_OTHER): Payer: Managed Care, Other (non HMO) | Admitting: Nurse Practitioner

## 2018-06-25 ENCOUNTER — Ambulatory Visit: Payer: Self-pay | Admitting: Nurse Practitioner

## 2018-06-25 ENCOUNTER — Encounter: Payer: Self-pay | Admitting: Nurse Practitioner

## 2018-06-25 VITALS — BP 131/91 | HR 68 | Ht 61.81 in

## 2018-06-25 DIAGNOSIS — M79604 Pain in right leg: Secondary | ICD-10-CM | POA: Diagnosis not present

## 2018-06-25 DIAGNOSIS — M542 Cervicalgia: Secondary | ICD-10-CM

## 2018-06-25 MED ORDER — ACETAMINOPHEN 500 MG PO TABS: 500.0000 mg | ORAL_TABLET | Freq: Three times a day (TID) | ORAL | 0 refills | Status: DC | PRN

## 2018-06-25 NOTE — Patient Instructions (Signed)
Symptoms related to musculoskeletal strain She declined meloxicam prescription. She opted to use tylenol only at this time. Call office if symptoms worsen. Gave patient hospital precautions: dizziness, chest pain, dizziness, swelling, numbness or tingling, or weakness. F/up in 2weeks

## 2018-06-25 NOTE — Telephone Encounter (Signed)
LVM for the pt to call back, need to offer an appt with [facility].   Monica please help follow up with schedule an appt.

## 2018-06-25 NOTE — Progress Notes (Signed)
Virtual Visit via Video Note  I connected with Jennifer [name] on 38/25/05 at 10:15 AM EDT by a video enabled telemedicine application and verified that I am speaking with the correct person using two identifiers.  Location: Patient: In car Provider: Office   I discussed the limitations of evaluation and management by telemedicine and the availability of in person appointments. The patient expressed understanding and agreed to proceed.  CC: pt is c/o neck pain going -2 days--- leg pain---1 wk---no injury--took tylenol and rest.   History of Present Illness: Neck Pain   This is a new problem. The current episode started yesterday. The problem occurs constantly. The problem has been unchanged. The pain is associated with nothing. The pain is present in the right side and left side. The quality of the pain is described as aching. The symptoms are aggravated by twisting. Stiffness is present all day. Associated symptoms include leg pain. Pertinent negatives include no chest pain, fever, headaches, numbness, pain with swallowing, paresis, photophobia, syncope, tingling, trouble swallowing, visual change, weakness or weight loss. She has tried nothing for the symptoms.  Leg Pain   The incident occurred more than 1 week ago. There was no injury mechanism. The pain is present in the right leg. The quality of the pain is described as aching. The pain has been intermittent since onset. Pertinent negatives include no inability to bear weight, loss of motion, loss of sensation, muscle weakness, numbness or tingling. She reports no foreign bodies present. Nothing aggravates the symptoms. She has tried acetaminophen for the symptoms. The treatment provided significant relief.   no swelling, no redness, no pain with ambulation, no rash No recent prolong immobilization. Did not take pravastatin as prescribed. Reports increased stress and anxiety amid COVID-19 pandemic but declines to discuss feeling any  further.  Observations/Objective: Physical Exam  Constitutional: She is oriented to person, place, and time.  Neck: Normal range of motion. Neck supple.  Pulmonary/Chest: Effort normal.  Neurological: She is alert and oriented to person, place, and time.  Vitals reviewed. unable to examine LE(she is sitting in car in public parking lot during video call)  Assessment and Plan: Jennifer [name] was seen today for neck pain.  Diagnoses and all orders for this visit:  Neck pain, bilateral -     acetaminophen (TYLENOL) 500 MG tablet; Take 1-2 tablets (500-1,000 mg total) by mouth every 8 (eight) hours as needed.  Pain of right lower extremity -     acetaminophen (TYLENOL) 500 MG tablet; Take 1-2 tablets (500-1,000 mg total) by mouth every 8 (eight) hours as needed.   Follow Up Instructions: Symptoms related to musculoskeletal strain She declined meloxicam prescription. She opted to use tylenol only at this time. Call office if symptoms worsen. Gave patient hospital precautions: dizziness, chest pain, dizziness, swelling, numbness or tingling, or weakness. F/up in 2weeks   I discussed the assessment and treatment plan with the patient. The patient was provided an opportunity to ask questions and all were answered. The patient agreed with the plan and demonstrated an understanding of the instructions.   The patient was advised to call back or seek an in-person evaluation if the symptoms worsen or if the condition fails to improve as anticipated.   Wilfred Lacy, NP

## 2018-06-25 NOTE — Telephone Encounter (Signed)
Pt. Reports she started having right sided neck pain that goes into her shoulder. Occasionally has chest heaviness. Concerned because she also has right leg pain. Does not recall any injury. States she has recently started walking for exercise. No weakness or numbness to extremities. States she is concerned about a blood clot to her leg. Office is currently closed. Will call practice when they open.  Answer Assessment - Initial Assessment Questions 1. ONSET: "When did the pain begin?"      Started 2 days ago 2. LOCATION: "Where does it hurt?"      Hurts on right side and into shoulder 3. PATTERN "Does the pain come and go, or has it been constant since it started?"      Constant 4. SEVERITY: "How bad is the pain?"  (Scale 1-10; or mild, moderate, severe)   - MILD (1-3): doesn't interfere with normal activities    - MODERATE (4-7): interferes with normal activities or awakens from sleep    - SEVERE (8-10):  excruciating pain, unable to do any normal activities      5 5. RADIATION: "Does the pain go anywhere else, shoot into your arms?"     Shoulder - leg pain  6. CORD SYMPTOMS: "Any weakness or numbness of the arms or legs?"     No 7. CAUSE: "What do you think is causing the neck pain?"     Unsure 8. NECK OVERUSE: "Any recent activities that involved turning or twisting the neck?"     No 9. OTHER SYMPTOMS: "Do you have any other symptoms?" (e.g., headache, fever, chest pain, difficulty breathing, neck swelling)     Chest heaviness on and off 10. PREGNANCY: "Is there any chance you are pregnant?" "When was your last menstrual period?"       No  Protocols used: NECK PAIN OR STIFFNESS-A-AH

## 2018-07-02 ENCOUNTER — Encounter: Payer: Self-pay | Admitting: Nurse Practitioner

## 2018-07-07 ENCOUNTER — Ambulatory Visit: Payer: Managed Care, Other (non HMO) | Admitting: Nurse Practitioner

## 2018-07-14 MED FILL — CARVEDILOL 25 MG TABLET: 25 | 30 days supply | Qty: 60 | Fill #2

## 2018-08-17 MED FILL — CARVEDILOL 25 MG TABLET: 25 | 30 days supply | Qty: 60 | Fill #3

## 2018-09-22 MED FILL — FLUTICASONE PROP 50 MCG SPR: 50 | 30 days supply | Qty: 16 | Fill #2

## 2018-09-22 MED FILL — CARVEDILOL 25 MG TABLET: 25 | 30 days supply | Qty: 60 | Fill #4

## 2018-10-27 MED FILL — CARVEDILOL 25 MG TABLET: 25 | 30 days supply | Qty: 60 | Fill #5

## 2018-11-27 ENCOUNTER — Telehealth: Payer: Self-pay | Admitting: Nurse Practitioner

## 2018-11-27 DIAGNOSIS — I10 Essential (primary) hypertension: Secondary | ICD-10-CM

## 2018-11-27 MED ORDER — CARVEDILOL 25 MG PO TABS: ORAL_TABLET | ORAL | 0 refills | Status: DC

## 2018-11-27 MED FILL — CARVEDILOL 25 MG TABLET: 25 | 30 days supply | Qty: 60 | Fill #4

## 2018-11-27 NOTE — Telephone Encounter (Signed)
received refill request from [facility] for Carvedilol 25 mg take 1 tab BID with meal.   LVM for the pt to call back, need to schedule follow up with PCP fore more refill---30 days supply sent.

## 2018-12-02 ENCOUNTER — Other Ambulatory Visit: Payer: Self-pay

## 2018-12-02 ENCOUNTER — Ambulatory Visit: Payer: Managed Care, Other (non HMO) | Admitting: Nurse Practitioner

## 2018-12-02 ENCOUNTER — Encounter: Payer: Self-pay | Admitting: Nurse Practitioner

## 2018-12-02 VITALS — BP 132/90 | HR 76 | Temp 97.5°F | Ht 61.81 in | Wt 254.2 lb

## 2018-12-02 DIAGNOSIS — R739 Hyperglycemia, unspecified: Secondary | ICD-10-CM | POA: Diagnosis not present

## 2018-12-02 DIAGNOSIS — M25562 Pain in left knee: Secondary | ICD-10-CM | POA: Diagnosis not present

## 2018-12-02 DIAGNOSIS — Z6841 Body Mass Index (BMI) 40.0 and over, adult: Secondary | ICD-10-CM

## 2018-12-02 DIAGNOSIS — M25561 Pain in right knee: Secondary | ICD-10-CM | POA: Diagnosis not present

## 2018-12-02 DIAGNOSIS — M791 Myalgia, unspecified site: Secondary | ICD-10-CM

## 2018-12-02 DIAGNOSIS — I10 Essential (primary) hypertension: Secondary | ICD-10-CM

## 2018-12-02 LAB — CBC WITH DIFFERENTIAL/PLATELET
Basophils Absolute: 0.1 10*3/uL (ref 0.0–0.1)
Basophils Relative: 0.8 % (ref 0.0–3.0)
Eosinophils Absolute: 0.1 10*3/uL (ref 0.0–0.7)
Eosinophils Relative: 2.3 % (ref 0.0–5.0)
HCT: 36.6 % (ref 36.0–46.0)
Hemoglobin: 12 g/dL (ref 12.0–15.0)
Lymphocytes Relative: 34.7 % (ref 12.0–46.0)
Lymphs Abs: 2.2 10*3/uL (ref 0.7–4.0)
MCHC: 32.7 g/dL (ref 30.0–36.0)
MCV: 89.6 fl (ref 78.0–100.0)
Monocytes Absolute: 0.5 10*3/uL (ref 0.1–1.0)
Monocytes Relative: 8.3 % (ref 3.0–12.0)
Neutro Abs: 3.4 10*3/uL (ref 1.4–7.7)
Neutrophils Relative %: 53.9 % (ref 43.0–77.0)
Platelets: 284 10*3/uL (ref 150.0–400.0)
RBC: 4.09 Mil/uL (ref 3.87–5.11)
RDW: 13.9 % (ref 11.5–15.5)
WBC: 6.2 10*3/uL (ref 4.0–10.5)

## 2018-12-02 LAB — BASIC METABOLIC PANEL
BUN: 17 mg/dL (ref 6–23)
CO2: 31 mEq/L (ref 19–32)
Calcium: 9.3 mg/dL (ref 8.4–10.5)
Chloride: 101 mEq/L (ref 96–112)
Creatinine, Ser: 0.81 mg/dL (ref 0.40–1.20)
GFR: 87.28 mL/min (ref >60.00)
Glucose, Bld: 102 mg/dL — ABNORMAL HIGH (ref 70–99)
Potassium: 3.8 mEq/L (ref 3.5–5.1)
Sodium: 141 mEq/L (ref 135–145)

## 2018-12-02 LAB — SEDIMENTATION RATE: Sed Rate: 42 mm/hr — ABNORMAL HIGH (ref 0–30)

## 2018-12-02 LAB — CK: Total CK: 164 U/L (ref 7–177)

## 2018-12-02 LAB — TSH: TSH: 1.34 u[IU]/mL (ref 0.35–4.50)

## 2018-12-02 LAB — HEMOGLOBIN A1C: Hgb A1c MFr Bld: 5.8 % (ref 4.6–6.5)

## 2018-12-02 MED ORDER — KETOROLAC TROMETHAMINE 30 MG/ML IJ SOLN
30.0000 mg | Freq: Once | INTRAMUSCULAR | Status: AC
  Administered 2018-12-02: 30 mg via INTRAMUSCULAR

## 2018-12-02 MED ORDER — IBUPROFEN 600 MG PO TABS: 600.0000 mg | ORAL_TABLET | Freq: Three times a day (TID) | ORAL | 0 refills | Status: DC | PRN

## 2018-12-02 MED FILL — IBUPROFEN 600 MG TABLET: 600 | 10 days supply | Qty: 30 | Fill #0

## 2018-12-02 NOTE — Patient Instructions (Addendum)
Go to lab for blood draw.  You will be contacted to schedule appt with weight loss clinic.

## 2018-12-02 NOTE — Progress Notes (Signed)
Subjective:  Patient ID: Jennifer [name], female    DOB: 1960/09/24  Age: 58 y.o. MRN: [id number]  CC: Knee Pain (pt is c/o of both knee pain--behind knee- move up to thigh and lower pelvis part--going on for months---elavated)  Knee Pain  There was no injury mechanism. The pain is present in the right knee and left knee. The quality of the pain is described as aching and burning. The pain is severe. The pain has been worsening since onset. Pertinent negatives include no inability to bear weight, loss of motion, loss of sensation, muscle weakness, numbness or tingling. She reports no foreign bodies present. The symptoms are aggravated by movement and palpation. She has tried NSAIDs and rest for the symptoms. The treatment provided moderate relief.  pain radiates to inner upper thigh. Pain is worse with prolonged sitting and improves with walking. Denies an morning stiffness.   Hyperlipidemia: Did not start pravastatin. Lipid Panel     Component Value Date/Time   CHOL 191 04/21/2018 1102   TRIG 51.0 04/21/2018 1102   HDL 55.10 04/21/2018 1102   CHOLHDL 3 04/21/2018 1102   VLDL 10.2 04/21/2018 1102   LDLCALC 125 (H) 04/21/2018 1102   HTN: Stable with coreg and amlodipine. Compliant with CPAP machine. Not compliant with diet BP Readings from Last 3 Encounters:  12/07/18 (!) 134/94  12/03/18 116/80  12/02/18 132/90   Reviewed past Medical, Social and Family history today.  Outpatient Medications Prior to Visit  Medication Sig Dispense Refill  . acetaminophen (TYLENOL) 500 MG tablet Take 1-2 tablets (500-1,000 mg total) by mouth every 8 (eight) hours as needed. 30 tablet 0  . Albuterol Sulfate (PROAIR RESPICLICK) 123XX123 (90 Base) MCG/ACT AEPB Inhale 1 puff into the lungs every 6 (six) hours as needed. 1 each 0  . amLODipine (NORVASC) 10 MG tablet Take 1 tablet (10 mg total) by mouth at bedtime. 90 tablet 3  . fluticasone (FLONASE) 50 MCG/ACT nasal spray Place 2 sprays into both  nostrils daily. 16 g 5  . MAGNESIUM PO Take by mouth.    . Multiple Vitamin (MULTIVITAMIN WITH MINERALS) TABS tablet Take 1 tablet by mouth at bedtime. ALIVE multi-vitamin    . OVER THE COUNTER MEDICATION Herbal Tea daily    . carvedilol (COREG) 25 MG tablet TAKE 1 TABLET BY MOUTH TWICE A DAY WITH MEALS 60 tablet 0   No facility-administered medications prior to visit.     ROS See HPI  Objective:  BP 132/90   Pulse 76   Temp (!) 97.5 F (36.4 C) (Tympanic)   Ht 5' 1.81" (1.57 m)   Wt 254 lb 3.2 oz (115.3 kg)   SpO2 99%   BMI 46.78 kg/m   BP Readings from Last 3 Encounters:  12/07/18 (!) 134/94  12/03/18 116/80  12/02/18 132/90    Wt Readings from Last 3 Encounters:  12/07/18 254 lb (115.2 kg)  12/02/18 254 lb 3.2 oz (115.3 kg)  04/21/18 244 lb 12.8 oz (111 kg)    Physical Exam Vitals signs reviewed.  Cardiovascular:     Rate and Rhythm: Normal rate and regular rhythm.  Abdominal:     General: Bowel sounds are normal.     Palpations: Abdomen is soft.     Tenderness: There is no abdominal tenderness. There is no guarding.  Musculoskeletal:        General: Tenderness present. No swelling.     Right hip: She exhibits decreased range of motion, decreased strength and tenderness.  Left hip: She exhibits decreased range of motion, decreased strength and tenderness.     Right knee: She exhibits swelling and effusion. She exhibits normal range of motion and no erythema. Tenderness found. Medial joint line tenderness noted. No patellar tendon tenderness noted.     Left knee: She exhibits swelling. She exhibits normal range of motion, no effusion and no erythema. Tenderness found. Medial joint line tenderness noted. No patellar tendon tenderness noted.     Lumbar back: Normal.     Right upper leg: Normal.     Left upper leg: Normal.     Right lower leg: Edema present.     Left lower leg: Edema present.     Comments: guarded bilateral hip flexion and abduction. Slowed  change in position (from sitting to standing)  Neurological:     Mental Status: She is alert.     Lab Results  Component Value Date   WBC 6.2 12/02/2018   HGB 12.0 12/02/2018   HCT 36.6 12/02/2018   PLT 284.0 12/02/2018   GLUCOSE 102 (H) 12/02/2018   CHOL 191 04/21/2018   TRIG 51.0 04/21/2018   HDL 55.10 04/21/2018   LDLCALC 125 (H) 04/21/2018   ALT 13 01/06/2017   AST 14 01/06/2017   NA 141 12/02/2018   K 3.8 12/02/2018   CL 101 12/02/2018   CREATININE 0.81 12/02/2018   BUN 17 12/02/2018   CO2 31 12/02/2018   TSH 1.34 12/02/2018   INR 1.0 12/11/2007   HGBA1C 5.8 12/02/2018    Mm 3d Screen Breast Bilateral  Result Date: 04/10/2018 CLINICAL DATA:  Screening. EXAM: DIGITAL SCREENING BILATERAL MAMMOGRAM WITH TOMO AND CAD COMPARISON:  Previous exam(s). ACR Breast Density Category c: The breast tissue is heterogeneously dense, which may obscure small masses. FINDINGS: There are no findings suspicious for malignancy. Images were processed with CAD. IMPRESSION: No mammographic evidence of malignancy. A result letter of this screening mammogram will be mailed directly to the patient. RECOMMENDATION: Screening mammogram in one year. (Code:SM-B-01Y) BI-RADS CATEGORY  1: Negative. Electronically Signed   By: Lillia Mountain M.D.   On: 04/10/2018 16:15    Assessment & Plan:   Jennifer [name] was seen today for knee pain.  Diagnoses and all orders for this visit:  Myalgia -     CK (Creatine Kinase) -     Basic metabolic panel -     Sedimentation rate -     CBC w/Diff -     TSH  Acute pain of both knees -     ketorolac (TORADOL) 30 MG/ML injection 30 mg -     Discontinue: ibuprofen (IBU) 600 MG tablet; Take 1 tablet (600 mg total) by mouth every 8 (eight) hours as needed (with food).  Essential hypertension -     Basic metabolic panel -     carvedilol (COREG) 25 MG tablet; TAKE 1 TABLET BY MOUTH TWICE A DAY WITH MEALS  Hyperglycemia -     Hemoglobin A1c  Class 3 severe obesity due to  excess calories with serious comorbidity and body mass index (BMI) of 45.0 to 49.9 in adult (HCC) -     Amb Ref to Medical Weight Management   I am having Jennifer [name] maintain her multivitamin with minerals, OVER THE COUNTER MEDICATION, Albuterol Sulfate, fluticasone, amLODipine, acetaminophen, MAGNESIUM PO, and carvedilol. We administered ketorolac.  Meds ordered this encounter  Medications  . ketorolac (TORADOL) 30 MG/ML injection 30 mg  . DISCONTD: ibuprofen (IBU) 600 MG tablet  Sig: Take 1 tablet (600 mg total) by mouth every 8 (eight) hours as needed (with food).    Dispense:  30 tablet    Refill:  0    Order Specific Question:   Supervising Provider    Answer:   Lucille Passy [[id number]]  . carvedilol (COREG) 25 MG tablet    Sig: TAKE 1 TABLET BY MOUTH TWICE A DAY WITH MEALS    Dispense:  180 tablet    Refill:  1    Order Specific Question:   Supervising Provider    Answer:   Lucille Passy [[id number]]    Problem List Items Addressed This Visit      Cardiovascular and Mediastinum   Essential hypertension   Relevant Medications   carvedilol (COREG) 25 MG tablet   Other Relevant Orders   Basic metabolic panel (Completed)     Other   Hyperglycemia   Relevant Orders   Hemoglobin A1c (Completed)   Obesity   Relevant Orders   Amb Ref to Medical Weight Management    Other Visit Diagnoses    Myalgia    -  Primary   Relevant Orders   CK (Creatine Kinase) (Completed)   Basic metabolic panel (Completed)   Sedimentation rate (Completed)   CBC w/Diff (Completed)   TSH (Completed)   Acute pain of both knees       Relevant Medications   ketorolac (TORADOL) 30 MG/ML injection 30 mg (Completed)      Follow-up: Return in about 3 months (around 03/04/2019) for HTN and , hyperlipidemia (fasting).  Wilfred Lacy, NP

## 2018-12-03 ENCOUNTER — Encounter (HOSPITAL_COMMUNITY): Payer: Self-pay

## 2018-12-03 ENCOUNTER — Emergency Department (HOSPITAL_COMMUNITY): Payer: Managed Care, Other (non HMO)

## 2018-12-03 ENCOUNTER — Telehealth: Payer: Self-pay | Admitting: Nurse Practitioner

## 2018-12-03 ENCOUNTER — Emergency Department (HOSPITAL_COMMUNITY)
Admission: EM | Admit: 2018-12-03 | Discharge: 2018-12-03 | Disposition: A | Discharge status: Home / Self Care | Payer: Managed Care, Other (non HMO) | Attending: Emergency Medicine | Admitting: Emergency Medicine

## 2018-12-03 ENCOUNTER — Other Ambulatory Visit: Payer: Self-pay

## 2018-12-03 DIAGNOSIS — Z87891 Personal history of nicotine dependence: Secondary | ICD-10-CM | POA: Insufficient documentation

## 2018-12-03 DIAGNOSIS — M1909 Primary osteoarthritis, other specified site: Secondary | ICD-10-CM | POA: Insufficient documentation

## 2018-12-03 DIAGNOSIS — I10 Essential (primary) hypertension: Secondary | ICD-10-CM | POA: Insufficient documentation

## 2018-12-03 DIAGNOSIS — Z79899 Other long term (current) drug therapy: Secondary | ICD-10-CM | POA: Insufficient documentation

## 2018-12-03 DIAGNOSIS — M79605 Pain in left leg: Secondary | ICD-10-CM | POA: Diagnosis present

## 2018-12-03 MED ORDER — ACETAMINOPHEN 500 MG PO TABS
1000.0000 mg | ORAL_TABLET | Freq: Once | ORAL | Status: AC
  Administered 2018-12-03: 05:00:00 1000 mg via ORAL
  Filled 2018-12-03: qty 2

## 2018-12-03 MED ORDER — DICLOFENAC SODIUM ER 100 MG PO TB24: 100.0000 mg | ORAL_TABLET | Freq: Every day | ORAL | 0 refills | Status: DC

## 2018-12-03 MED ORDER — KETOROLAC TROMETHAMINE 30 MG/ML IJ SOLN
30.0000 mg | Freq: Once | INTRAMUSCULAR | Status: AC
  Administered 2018-12-03: 30 mg via INTRAMUSCULAR
  Filled 2018-12-03: qty 1

## 2018-12-03 MED FILL — DICLOFENAC SODIUM 75 MG TAB: 75 | 10 days supply | Qty: 20 | Fill #0

## 2018-12-03 NOTE — Telephone Encounter (Signed)
Pt went to er around 4 AM this morning and was given diclofenac sodium 75 mg and pt would like to know if its ok to take. Pt already taking bp med and diclofenac can cause strokes and heart attacks and raise her bp. Pt would like to know if she can take something else. Pt has not taken medication

## 2018-12-03 NOTE — ED Triage Notes (Signed)
Pt complains of left leg pain for several months and last night it moved to her pelvis, she states that it's hard to get up and walk

## 2018-12-03 NOTE — ED Notes (Signed)
Pt supplied with heat packs for pain relief. She was also given a new mask because hers broke.

## 2018-12-03 NOTE — Telephone Encounter (Signed)
On review of pts chart it looks as though ER determined pain/symptoms due to osteoarthritis. NSAID would be first line treatment. Diclofenac is an NSAID. NSAIDs can be used in pts with HTN, especially if it is well controlled. All NSAIDs (relafen, naproxen, etc) will have the same warning. If she wants to try ibuprofen (motrin, advil) or naprosyn (aleve) which are OTC she can do that. Diclofenac would really be ok to use in short term and on as needed basis.  BP Readings from Last 3 Encounters:  12/03/18 116/80  12/02/18 132/90  06/25/18 (!) 131/91

## 2018-12-03 NOTE — ED Notes (Signed)
Patient arrived back from X-ray via stretcher.

## 2018-12-03 NOTE — ED Provider Notes (Signed)
[facility] DEPT Provider Note   CSN: [id number] Arrival date & time: 12/03/18  0416     History   Chief Complaint No chief complaint on file.   HPI Jennifer [name] is a 58 y.o. female.     The history is provided by the patient.  Leg Pain Location:  Leg Leg location:  L leg, R leg, R upper leg, L lower leg, L upper leg and R lower leg Pain details:    Quality:  Pressure and burning   Severity:  Severe   Onset quality:  Gradual   Timing:  Constant   Progression:  Waxing and waning Chronicity:  Chronic Dislocation: no   Foreign body present:  No foreign bodies Prior injury to area:  No Relieved by:  Nothing Worsened by:  Nothing Ineffective treatments:  None tried Associated symptoms: no back pain, no decreased ROM, no fatigue, no fever, no itching, no muscle weakness, no neck pain, no numbness, no stiffness, no swelling and no tingling   Risk factors: obesity   Patient seen by PMD for same earlier in the day and told to start NSAIDs and follow up with weight management presents with ongoing leg pain in BLE.  No f/c/r. No trauma.  No rashes or lesions.  No weakness or numbness.    Past Medical History:  Diagnosis Date  . Allergy    allergic rhinitis  . Anemia 06/2007   C-scope 06/2007: tics, neg hemocults 07/2007  . Anxiety   . Blood transfusion without reported diagnosis    at birth  . Chest pain 12/2007   negative ECHO, neg stress test 06/2008  . Condyloma acuminatum    history of  . Hyperglycemia    borderline. A1c 5.7  12/2007  . Hypertension   . Personal history of atrial fibrillation    on carvedilol only   . Sleep apnea    wears cpap at night    Patient Active Problem List   Diagnosis Date Noted  . Elevated LDL cholesterol level 04/21/2018  . OSA (obstructive sleep apnea) 03/13/2016  . Hyperglycemia 01/12/2016  . Atrial fibrillation ([place]) 11/16/2015  . Esophageal reflux 10/16/2012  . Periodic headache syndrome  08/21/2012  . Obesity 12/20/2011  . HOT FLASHES 04/27/2010  . CHOLECYSTECTOMY, LAPAROSCOPIC, HX OF 08/09/2009  . Allergic rhinitis 04/26/2008  . CONDYLOMA ACUMINATUM 04/15/2007  . Essential hypertension 07/03/2006    Past Surgical History:  Procedure Laterality Date  . ABDOMINAL HYSTERECTOMY     no oophorectomy  . BREAST EXCISIONAL BIOPSY    . BREAST SURGERY  age 2   Fibroid tumor removed right breast  . CARPAL TUNNEL RELEASE    . CESAREAN SECTION    . CHOLECYSTECTOMY    . COLONOSCOPY       OB History   No obstetric history on file.      Home Medications    Prior to Admission medications   Medication Sig Start Date End Date Taking? Authorizing Provider  acetaminophen (TYLENOL) 500 MG tablet Take 1-2 tablets (500-1,000 mg total) by mouth every 8 (eight) hours as needed. 06/25/18   Nche, Charlene Brooke, NP  Albuterol Sulfate (PROAIR RESPICLICK) 123XX123 (90 Base) MCG/ACT AEPB Inhale 1 puff into the lungs every 6 (six) hours as needed. 01/26/18   Nche, Charlene Brooke, NP  amLODipine (NORVASC) 10 MG tablet Take 1 tablet (10 mg total) by mouth at bedtime. 04/21/18   Nche, Charlene Brooke, NP  carvedilol (COREG) 25 MG tablet TAKE 1  TABLET BY MOUTH TWICE A DAY WITH MEALS 11/27/18   Nche, Charlene Brooke, NP  fluticasone (FLONASE) 50 MCG/ACT nasal spray Place 2 sprays into both nostrils daily. 04/21/18   Nche, Charlene Brooke, NP  ibuprofen (IBU) 600 MG tablet Take 1 tablet (600 mg total) by mouth every 8 (eight) hours as needed (with food). 12/02/18   Nche, Charlene Brooke, NP  MAGNESIUM PO Take by mouth.    [provider]  Multiple Vitamin (MULTIVITAMIN WITH MINERALS) TABS tablet Take 1 tablet by mouth at bedtime. ALIVE multi-vitamin    [provider]  OVER THE COUNTER MEDICATION Herbal Tea daily    [provider]    Family History Family History  Problem Relation Age of Onset  . Breast cancer Paternal Aunt   . Cervical cancer Cousin   . Diabetes Mother   .  Coronary artery disease Father   . Kidney disease Father   . Diabetes Father   . Hypertension Father   . Prostate cancer Father   . Coronary artery disease Sister   . Kidney disease Sister   . Diabetes Sister   . Hypertension Sister   . Kidney disease Brother   . Stroke Brother   . Hypertension Brother        x3  . Diabetes Maternal Grandmother   . Stroke Maternal Grandmother   . Diabetes Paternal Grandmother   . Stroke Paternal Grandmother   . Coronary artery disease Other        grandmother, uncle  . Kidney disease Other        grandmother  . Stroke Other        uncle  . Thyroid disease Other        aunts  . Glaucoma Other        grandmother  . Hypertension Other        grandmother  . Diabetes Other        aunts  . Hypothyroidism Other        aunt  . Hyperthyroidism Other        aunt  . Healthy Child   . Healthy Child   . Healthy Child   . Colon cancer Neg Hx   . Colon polyps Neg Hx   . Esophageal cancer Neg Hx   . Rectal cancer Neg Hx   . Stomach cancer Neg Hx     Social History Social History   Tobacco Use  . Smoking status: Former Smoker    Packs/day: 0.50    Years: 27.00    Pack years: 13.50    Types: Cigarettes    Quit date: 02/12/2004    Years since quitting: 14.8  . Smokeless tobacco: Never Used  Substance Use Topics  . Alcohol use: No  . Drug use: No     Allergies   Codeine   Review of Systems Review of Systems  Constitutional: Negative for fatigue and fever.  HENT: Negative for congestion.   Eyes: Negative for visual disturbance.  Respiratory: Negative for cough and shortness of breath.   Cardiovascular: Negative for chest pain, palpitations and leg swelling.  Gastrointestinal: Negative for abdominal pain.  Genitourinary: Negative for difficulty urinating.  Musculoskeletal: Negative for back pain, neck pain and stiffness.  Skin: Negative for itching.  Neurological: Negative for headaches.  Psychiatric/Behavioral: Negative for  agitation.  All other systems reviewed and are negative.    Physical Exam Updated Vital Signs Temp 98.5 F (36.9 C)   Physical Exam Vitals signs and  nursing note reviewed.  Constitutional:      General: She is not in acute distress.    Appearance: She is obese.  HENT:     Head: Normocephalic and atraumatic.     Nose: Nose normal.  Eyes:     Conjunctiva/sclera: Conjunctivae normal.     Pupils: Pupils are equal, round, and reactive to light.  Neck:     Musculoskeletal: Normal range of motion and neck supple.  Cardiovascular:     Rate and Rhythm: Normal rate and regular rhythm.     Pulses: Normal pulses.     Heart sounds: Normal heart sounds.  Pulmonary:     Effort: Pulmonary effort is normal.     Breath sounds: Normal breath sounds.  Abdominal:     General: Abdomen is flat. Bowel sounds are normal.     Tenderness: There is no abdominal tenderness. There is no guarding.  Musculoskeletal: Normal range of motion.        General: No swelling.  Skin:    General: Skin is warm and dry.     Capillary Refill: Capillary refill takes less than 2 seconds.  Neurological:     General: No focal deficit present.     Mental Status: She is alert and oriented to person, place, and time.     Deep Tendon Reflexes: Reflexes normal.  Psychiatric:        Mood and Affect: Mood normal.        Behavior: Behavior normal.      ED Treatments / Results  Labs (all labs ordered are listed, but only abnormal results are displayed) Labs Reviewed - No data to display  EKG None  Radiology No results found.  Procedures Procedures (including critical care time)  Medications Ordered in ED Medications  ketorolac (TORADOL) 30 MG/ML injection 30 mg (has no administration in time range)  acetaminophen (TYLENOL) tablet 1,000 mg (has no administration in time range)    Recommended NSAIDs, compression stockings and follow up with sports medicine.  I agree with patient's PMD that weight loss may  be of some benefit.  Labs from earlier in the day reviewed and are unremarkable.  Stable for discharge with close follow up.    Jennifer [name] was evaluated in Emergency Department on 12/03/2018 for the symptoms described in the history of present illness. She was evaluated in the context of the global COVID-19 pandemic, which necessitated consideration that the patient might be at risk for infection with the SARS-CoV-2 virus that causes COVID-19. Institutional protocols and algorithms that pertain to the evaluation of patients at risk for COVID-19 are in a state of rapid change based on information released by regulatory bodies including the CDC and federal and state organizations. These policies and algorithms were followed during the patient's care in the ED.   Final Clinical Impressions(s) / ED Diagnoses    Return for weakness, numbness, changes in vision or speech, fevers >100.4 unrelieved by medication, shortness of breath, intractable vomiting, or diarrhea, abdominal pain, Inability to tolerate liquids or food, cough, altered mental status or any concerns. No signs of systemic illness or infection. The patient is nontoxic-appearing on exam and vital signs are within normal limits.   I have reviewed the triage vital signs and the nursing notes. Pertinent labs &imaging results that were available during my care of the patient were reviewed by me and considered in my medical decision making (see chart for details).  After history, exam, and medical workup I feel the patient  has been appropriately medically screened and is safe for discharge home. Pertinent diagnoses were discussed with the patient. Patient was given return precautions   Wassim Kirksey, MD 12/03/18 [id number]

## 2018-12-03 NOTE — Telephone Encounter (Signed)
Dr. Loletha Grayer please help in absence of Latimer today.

## 2018-12-03 NOTE — Discharge Instructions (Addendum)
Stop ibuprofen, take voltaren instead, follow up with sports medicine.

## 2018-12-03 NOTE — Telephone Encounter (Signed)
Pt is aware.   Pt also wondering if osteoarthritis can move around from joints to joints? Pt also mention that she has an appt with sport med.   Charlotte please advise--ok to wait until tomorrow.

## 2018-12-04 NOTE — Telephone Encounter (Signed)
You can have OA in multiple joints, hence pain in hips and knees.

## 2018-12-07 ENCOUNTER — Ambulatory Visit: Payer: Managed Care, Other (non HMO) | Admitting: Family Medicine

## 2018-12-07 ENCOUNTER — Other Ambulatory Visit: Payer: Self-pay

## 2018-12-07 VITALS — BP 134/94 | Ht 62.0 in | Wt 254.0 lb

## 2018-12-07 DIAGNOSIS — M869 Osteomyelitis, unspecified: Secondary | ICD-10-CM | POA: Diagnosis not present

## 2018-12-07 MED ORDER — CARVEDILOL 25 MG PO TABS: ORAL_TABLET | ORAL | 1 refills | Status: DC

## 2018-12-07 MED ORDER — NAPROXEN 500 MG PO TABS: 500.0000 mg | ORAL_TABLET | Freq: Two times a day (BID) | ORAL | 1 refills | Status: DC | PRN

## 2018-12-07 MED FILL — NAPROXEN 500 MG TABS: 500 | 30 days supply | Qty: 60 | Fill #0

## 2018-12-07 NOTE — Patient Instructions (Signed)
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 Ice the area 3-4 times a day for 15 minutes at a time Take naproxen 500mg  twice a day with food for pain and inflammation. Physical therapy for hip, pelvic, core stretching/strengthening has shown to be helpful for this condition once pain has eased. Follow up with me in 6 weeks.

## 2018-12-07 NOTE — Telephone Encounter (Signed)
LVM for the pt to call back, need to inform Charlotte's message below.  

## 2018-12-07 NOTE — Progress Notes (Signed)
Jennifer [name] is a 58 y.o. female who presents to [facility] today for the following:  Bilateral Leg/Hip Pain, L>R Pain moves back and forth to each legs, but mostly in the left leg Sits a lot at work and would have pain when standing in left knee, feels stiff after sitting Mostly on the medial joint line of left knee Last week, had worsening in pain that was in the pelvis and she was unable to walk 2/2 pain Has not had pain in hips or pelvis before this Was seen in ED on 10/22 for this Had XRs and was told she has osetoarthritis Was prescribed Diclofenac 100mg , took one tab, didn't help, was afraid to take more In the past, has not taken anything for the pain, no ice or heat No prior injury or trauma Pain worsens after walking and has worsening pain that night Endorses pain all over bilateral legs afterwards, mainly in the back No AM stiffness Now feels pain in her lower back since yesterday, "It just feels like everything is moving" No fevers, weight loss, numbness and tingling, saddle anesthesia, change in bowel or bladder function, no weakness in legs No history of knee swelling, popping, locking, or giving way Patient notes that the worst pain is felt in her groin  PMH reviewed. Hx HTN, A fib ROS as above. Medications reviewed.  Exam:  BP (!) 134/94   Ht 5\' 2"  (1.575 m)   Wt 254 lb (115.2 kg)   BMI 46.46 kg/m  Gen: Well NAD MSK:  Left Hip:  - Inspection: No gross deformity, no swelling, erythema, or ecchymosis bilaterally  - Palpation: TTP pubic symphysis, Mild TTP greater trochanter bilaterally, left SI joint, no TTP right greater trochanter specifically.  No TTP lumbar spinous processes, no step-off palpated. - ROM: Normal range of motion on Flexion, extension, abduction, internal and external rotation - Strength: Normal strength in all fields, except 4/5 strength in hip abduction and endorses pain in medial thigh with this.  No pain with resisted hip adduction. -  Neuro/vasc: NV intact distally - Special Tests: Positive FABER on left.  Negative FADIR.  Negative Log Roll b/l.  Left Knee: - Inspection: no gross deformity. No swelling/effusion, erythema or bruising. Skin intact - Palpation: TTP medial joint line bilateral knees, L>R, some fullness in b/l popliteal fossa - ROM: full active ROM with flexion and extension in knee and hip - Strength: 5/5 strength in knee flexion and extensions - Neuro/vasc: NV intact - Special Tests: - LIGAMENTS: negative anterior and posterior drawer, negative Lachman's, no MCL or LCL laxity  -- MENISCUS: negative McMurray's, negative seated Apley's  -- PF JOINT: nml patellar mobility bilaterally.  negative patellar grind, negative patellar apprehension  Right hip: No deformity, instability. FROM with 5/5 strength.  Pain midline at pubic bone with hip abduction. Tenderness as noted above. NVI distally. Negative logroll.  Dg Hips Bilat W Or Wo Pelvis 3-4 Views  Result Date: 12/03/2018 CLINICAL DATA:  After hip pain for several months increasing for 24 hours EXAM: DG HIP (WITH OR WITHOUT PELVIS) 3-4V BILAT COMPARISON:  Abdominal CT 01/23/2017 FINDINGS: No hip fracture or dislocation. No degenerative hip narrowing or spurring. Osteitis pubis, which was also seen on comparison study, with marginal sclerosis and irregularity. No evidence of pelvic ring fracture or lesion. Mild spurring and vacuum phenomenon seen at the sacroiliac joints. IMPRESSION: 1. Negative bilateral hip radiographs. 2. Osteitis pubis and bilateral sacroiliac osteoarthritis. Electronically Signed   By: Neva Seat.D.  On: 12/03/2018 06:07     Assessment and Plan: 1) Osteitis pubis (HCC) XRs and exam consistent with osteitis pubis as primary cause of her pain.  During physical exam, pain mostly felt at pubic symphysis, although a small amount at left SI.  Will give Naproxen 500mg  BID, advise rest, and ice.  Would hold off on PT given severity of  pain at present.  Activity as tolerated and given note for work for walker use prn.  Patient has her own walker to use.  Advised pool or exercise bike if able for exercise.  Will f/u in 6 weeks or sooner if worsening.  If improvement, will likely send to PT at next visit.   [place] Constable, D.O.  PGY-2 Family Medicine  12/07/2018 1:19 PM

## 2018-12-07 NOTE — Assessment & Plan Note (Signed)
XRs and exam consistent with osteitis pubis.  During physical exam, pain mostly felt at pubic symphysis, although a small amount at left SI.  Will give Naproxen 500mg  BID, advise rest, and ice.  Would hold off on PT given severity of pain at present.  Activity as tolerated and given note for work for walker use prn.  Patient has her own walker to use.  Advised pool or exercise bike if able for exercise.  Will f/u in 6 weeks or sooner if worsening.  If improvement, will likely send to PT at next visit.

## 2018-12-08 ENCOUNTER — Encounter: Payer: Self-pay | Admitting: Family Medicine

## 2018-12-08 NOTE — Telephone Encounter (Signed)
LVM for the pt to call back.

## 2018-12-09 NOTE — Telephone Encounter (Signed)
Left another vm 

## 2018-12-30 MED FILL — CARVEDILOL 25 MG TABLET: 25 | 30 days supply | Qty: 60 | Fill #0

## 2019-01-18 ENCOUNTER — Encounter: Payer: Self-pay | Admitting: Family Medicine

## 2019-01-18 ENCOUNTER — Other Ambulatory Visit: Payer: Self-pay

## 2019-01-18 ENCOUNTER — Ambulatory Visit: Payer: Managed Care, Other (non HMO) | Admitting: Family Medicine

## 2019-01-18 VITALS — BP 130/86 | Ht 62.0 in | Wt 243.0 lb

## 2019-01-18 DIAGNOSIS — M869 Osteomyelitis, unspecified: Secondary | ICD-10-CM | POA: Diagnosis not present

## 2019-01-18 DIAGNOSIS — M25562 Pain in left knee: Secondary | ICD-10-CM | POA: Diagnosis not present

## 2019-01-18 MED ORDER — METHYLPREDNISOLONE ACETATE 40 MG/ML IJ SUSP
40.0000 mg | Freq: Once | INTRAMUSCULAR | Status: AC
  Administered 2019-01-18: 40 mg via INTRA_ARTICULAR

## 2019-01-18 NOTE — Assessment & Plan Note (Signed)
Patient with follow-up from prior visit with left knee pain.  The associated osteitis pubis and has resolved and pain is now consistently in the medial joint line of the left knee.  Question of patellofemoral versus osteoarthritis, patient is offered steroid injection and consents  A steroid injection was performed at left knee using 1% plain Lidocaine and 3 mg of methylprednisolone using the anteromedial approach.  Risks and benefits were discussed with the patient and site was sterilized with alcohol swab prior to injection.  This was well tolerated.

## 2019-01-18 NOTE — Patient Instructions (Signed)
Your pain is due to arthritis, possibly patellofemoral syndrome (kneecap tracking issue). These are the different medications you can take for this: Tylenol 500mg  1-2 tabs three times a day for pain. Capsaicin, aspercreme, or biofreeze topically up to four times a day may also help with pain. Some supplements that may help for arthritis: Boswellia extract, curcumin, pycnogenol Aleve 1-2 tabs twice a day with food rarely as needed Cortisone injections are an option - you were given this today. Do home exercises 3 sets of 10 once a day. Consider physical therapy to strengthen muscles around the joint that hurts to take pressure off of the joint itself. Shoe inserts with good arch support may be helpful. Heat or ice 15 minutes at a time 3-4 times a day as needed to help with pain. Water aerobics and cycling with low resistance are the best two types of exercise for arthritis though any exercise is ok as long as it doesn't worsen the pain. Follow up with me in 6 weeks or as needed if you're doing great.

## 2019-01-18 NOTE — Progress Notes (Signed)
    Subjective:  Jennifer [name] is a 58 y.o. female who presents to the [facility] today with a chief complaint of follow-up for left knee pain and pubic osteitis  HPI:  Patient has been taking Advil over-the-counter and following her rest protocols.  She said that she has had resolution of the pelvic pain but that she still has significant medial joint line pain on the left knee.  This is worse when she actively flexes or extends the knee.  It is often worse at the end of the day after she has been sitting for long part of time and her knee gets very stiff.  Pain when bearing weight.  She has had no new significant injuries and has not had any falls.  No swelling.  Objective:  Physical Exam: BP 130/86   Ht 5\' 2"  (1.575 m)   Wt 243 lb (110.2 kg)   BMI 44.45 kg/m   Gen: NAD, pleasant and conversing comfortably Pulm: NWOB, no cough MSK: Pain with resisted flexion and extension of the left knee, no valgus or varus laxity, negative Apley, negative posterior and anterior drawer.  No erythema or sign of infection, no palpable indication of Baker's cyst.  No pain to palpation Skin: warm, dry, no rash or wounding Neuro: grossly normal, moves all extremities Psych: Normal affect and thought content  Bilateral hips/pelvis: No deformity. FROM with 5/5 strength.  No pain with resisted adduction. No tenderness to palpation. NVI distally.  No results found for this or any previous visit (from the past 72 hour(s)).   Assessment/Plan:  Left knee pain Patient with follow-up from prior visit with left knee pain.  Consistently in the medial joint line of the left knee.  Question of patellofemoral versus osteoarthritis, patient is offered steroid injection and consents.  Discussed tylenol, topical medications, supplements that may help, rare aleve.  After informed written consent was obtained, timeout was performed.  A steroid injection was given into left knee using 1% Lidocaine without epi and 40mg  of  depomedrol using an anteromedial approach.  Risks and benefits were discussed with the patient and site was sterilized with alcohol swab prior to injection.  This was well tolerated without immediate complications.  Sherene Sires, DO FAMILY MEDICINE RESIDENT - PGY3 01/18/2019 10:53 AM

## 2019-02-01 MED FILL — CARVEDILOL 25 MG TABLET: 25 | 30 days supply | Qty: 60 | Fill #1

## 2019-02-01 MED FILL — FLUTICASONE PROP 50 MCG SPR: 50 | 30 days supply | Qty: 16 | Fill #3

## 2019-02-04 ENCOUNTER — Ambulatory Visit: Payer: Managed Care, Other (non HMO) | Attending: Internal Medicine

## 2019-02-04 DIAGNOSIS — Z20822 Contact with and (suspected) exposure to covid-19: Secondary | ICD-10-CM

## 2019-02-05 LAB — NOVEL CORONAVIRUS, NAA: SARS-CoV-2, NAA: NOT DETECTED

## 2019-02-26 ENCOUNTER — Other Ambulatory Visit (HOSPITAL_BASED_OUTPATIENT_CLINIC_OR_DEPARTMENT_OTHER): Payer: Self-pay | Admitting: Nurse Practitioner

## 2019-02-26 DIAGNOSIS — Z1231 Encounter for screening mammogram for malignant neoplasm of breast: Secondary | ICD-10-CM

## 2019-03-01 ENCOUNTER — Ambulatory Visit: Payer: Managed Care, Other (non HMO) | Admitting: Family Medicine

## 2019-03-05 ENCOUNTER — Ambulatory Visit: Payer: Managed Care, Other (non HMO) | Admitting: Nurse Practitioner

## 2019-03-15 MED FILL — CARVEDILOL 25 MG TABLET: 25 | 30 days supply | Qty: 60 | Fill #2

## 2019-03-22 ENCOUNTER — Other Ambulatory Visit: Payer: Self-pay

## 2019-03-22 ENCOUNTER — Ambulatory Visit (INDEPENDENT_AMBULATORY_CARE_PROVIDER_SITE_OTHER): Payer: Managed Care, Other (non HMO) | Admitting: Nurse Practitioner

## 2019-03-22 ENCOUNTER — Encounter: Payer: Self-pay | Admitting: Nurse Practitioner

## 2019-03-22 VITALS — BP 150/100 | HR 63 | Temp 96.6°F | Ht 62.0 in | Wt 245.2 lb

## 2019-03-22 DIAGNOSIS — R739 Hyperglycemia, unspecified: Secondary | ICD-10-CM | POA: Diagnosis not present

## 2019-03-22 DIAGNOSIS — E7800 Pure hypercholesterolemia, unspecified: Secondary | ICD-10-CM | POA: Diagnosis not present

## 2019-03-22 DIAGNOSIS — I10 Essential (primary) hypertension: Secondary | ICD-10-CM

## 2019-03-22 LAB — LIPID PANEL
Cholesterol: 185 mg/dL (ref 0–200)
HDL: 47.6 mg/dL (ref >39.00)
LDL Cholesterol: 128 mg/dL — ABNORMAL HIGH (ref 0–99)
NonHDL: 137.28
Total CHOL/HDL Ratio: 4
Triglycerides: 44 mg/dL (ref 0.0–149.0)
VLDL: 8.8 mg/dL (ref 0.0–40.0)

## 2019-03-22 LAB — BASIC METABOLIC PANEL
BUN: 13 mg/dL (ref 6–23)
CO2: 29 mEq/L (ref 19–32)
Calcium: 9.1 mg/dL (ref 8.4–10.5)
Chloride: 104 mEq/L (ref 96–112)
Creatinine, Ser: 0.72 mg/dL (ref 0.40–1.20)
GFR: 99.89 mL/min (ref >60.00)
Glucose, Bld: 96 mg/dL (ref 70–99)
Potassium: 3.5 mEq/L (ref 3.5–5.1)
Sodium: 139 mEq/L (ref 135–145)

## 2019-03-22 LAB — HEMOGLOBIN A1C: Hgb A1c MFr Bld: 5.7 % (ref 4.6–6.5)

## 2019-03-22 MED ORDER — OLMESARTAN MEDOXOMIL 20 MG PO TABS: 10.0000 mg | ORAL_TABLET | Freq: Every morning | ORAL | 1 refills | Status: DC

## 2019-03-22 MED FILL — OLMESARTAN MEDOXOMIL 20 MG: 20 | 30 days supply | Qty: 15 | Fill #0

## 2019-03-22 NOTE — Progress Notes (Signed)
Subjective:  Patient ID: Jennifer [name], female    DOB: 16-Jan-1961  Age: 59 y.o. MRN: [id number]  CC: Follow-up (3 mo follow up HTN and , hyperlipidemia (fasting). pt report BP at home running 140s/90s)  HPI HTN: BP not at goal with coreg and amlodipine. Home BP readings: 140s/90s Reports compliance with CPAP machine, last seen by pulmonology 2019. LDL at 125 with 57yrs cardiovascular rist at 7.5%. Did not start pravastatin as recommended States she has made changes to diet: low carb and low sodium. No exercise regimen at this time. No headache, no dizziness, no LE edema, no palpitations.  BP Readings from Last 3 Encounters:  03/22/19 (!) 150/100  01/18/19 130/86  12/07/18 (!) 134/94   Wt Readings from Last 3 Encounters:  03/22/19 245 lb 3.2 oz (111.2 kg)  01/18/19 243 lb (110.2 kg)  12/07/18 254 lb (115.2 kg)   Reviewed past Medical, Social and Family history today.  Outpatient Medications Prior to Visit  Medication Sig Dispense Refill  . Albuterol Sulfate (PROAIR RESPICLICK) 123XX123 (90 Base) MCG/ACT AEPB Inhale 1 puff into the lungs every 6 (six) hours as needed. 1 each 0  . amLODipine (NORVASC) 10 MG tablet Take 1 tablet (10 mg total) by mouth at bedtime. 90 tablet 3  . carvedilol (COREG) 25 MG tablet TAKE 1 TABLET BY MOUTH TWICE A DAY WITH MEALS 180 tablet 1  . fluticasone (FLONASE) 50 MCG/ACT nasal spray Place 2 sprays into both nostrils daily. 16 g 5  . MAGNESIUM PO Take by mouth.    . Multiple Vitamin (MULTIVITAMIN WITH MINERALS) TABS tablet Take 1 tablet by mouth at bedtime. ALIVE multi-vitamin    . OVER THE COUNTER MEDICATION Herbal Tea daily    . acetaminophen (TYLENOL) 500 MG tablet Take 1-2 tablets (500-1,000 mg total) by mouth every 8 (eight) hours as needed. (Patient not taking: Reported on 03/22/2019) 30 tablet 0  . Diclofenac Sodium CR 100 MG 24 hr tablet Take 1 tablet (100 mg total) by mouth daily. (Patient not taking: Reported on 03/22/2019) 10 tablet 0  .  naproxen (NAPROSYN) 500 MG tablet Take 1 tablet (500 mg total) by mouth 2 (two) times daily as needed. (Patient not taking: Reported on 03/22/2019) 60 tablet 1   No facility-administered medications prior to visit.    ROS See HPI  Objective:  BP (!) 150/100   Pulse 63   Temp (!) 96.6 F (35.9 C) (Tympanic)   Ht 5\' 2"  (1.575 m)   Wt 245 lb 3.2 oz (111.2 kg)   SpO2 99%   BMI 44.85 kg/m   BP Readings from Last 3 Encounters:  03/22/19 (!) 150/100  01/18/19 130/86  12/07/18 (!) 134/94   Wt Readings from Last 3 Encounters:  03/22/19 245 lb 3.2 oz (111.2 kg)  01/18/19 243 lb (110.2 kg)  12/07/18 254 lb (115.2 kg)   Physical Exam Vitals reviewed.  Cardiovascular:     Rate and Rhythm: Normal rate and regular rhythm.     Pulses: Normal pulses.     Heart sounds: Normal heart sounds.  Pulmonary:     Effort: Pulmonary effort is normal.     Breath sounds: Normal breath sounds.  Musculoskeletal:     Right lower leg: No edema.     Left lower leg: No edema.  Neurological:     Mental Status: She is alert and oriented to person, place, and time.    Lab Results  Component Value Date   WBC 6.2 12/02/2018  HGB 12.0 12/02/2018   HCT 36.6 12/02/2018   PLT 284.0 12/02/2018   GLUCOSE 102 (H) 12/02/2018   CHOL 191 04/21/2018   TRIG 51.0 04/21/2018   HDL 55.10 04/21/2018   LDLCALC 125 (H) 04/21/2018   ALT 13 01/06/2017   AST 14 01/06/2017   NA 141 12/02/2018   K 3.8 12/02/2018   CL 101 12/02/2018   CREATININE 0.81 12/02/2018   BUN 17 12/02/2018   CO2 31 12/02/2018   TSH 1.34 12/02/2018   INR 1.0 12/11/2007   HGBA1C 5.8 12/02/2018   Assessment & Plan:  This visit occurred during the SARS-CoV-2 public health emergency.  Safety protocols were in place, including screening questions prior to the visit, additional usage of staff PPE, and extensive cleaning of exam room while observing appropriate contact time as indicated for disinfecting solutions.   [place] was seen today for  follow-up.  Diagnoses and all orders for this visit:  Essential hypertension -     Basic metabolic panel -     olmesartan (BENICAR) 20 MG tablet; Take 0.5 tablets (10 mg total) by mouth every morning.  Hyperglycemia -     Hemoglobin A1c  Elevated LDL cholesterol level -     Lipid panel   I have discontinued Macee A. Cafaro's acetaminophen, Diclofenac Sodium CR, and naproxen. I am also having her start on olmesartan. Additionally, I am having her maintain her multivitamin with minerals, OVER THE COUNTER MEDICATION, Albuterol Sulfate, fluticasone, amLODipine, MAGNESIUM PO, and carvedilol.  Meds ordered this encounter  Medications  . olmesartan (BENICAR) 20 MG tablet    Sig: Take 0.5 tablets (10 mg total) by mouth every morning.    Dispense:  45 tablet    Refill:  1    Order Specific Question:   Supervising Provider    Answer:   Lucille Passy [[id number]]    Problem List Items Addressed This Visit      Cardiovascular and Mediastinum   Essential hypertension - Primary    BP not at goal with coreg and amlodipine. Home BP readings: 140s/90s States she has made changes to diet: low carb and low sodium No exercise regimen at this time. No headache, no dizziness, no LE edema, no palipitations  Start benicar F/up in 19months BP Readings from Last 3 Encounters:  03/22/19 (!) 150/100  01/18/19 130/86  12/07/18 (!) 134/94        Relevant Medications   olmesartan (BENICAR) 20 MG tablet   Other Relevant Orders   Basic metabolic panel     Other   Elevated LDL cholesterol level   Relevant Orders   Lipid panel   Hyperglycemia   Relevant Orders   Hemoglobin A1c       Follow-up: Return in about 3 months (around 06/19/2019) for CPE (fasting).  Wilfred Lacy, NP

## 2019-03-22 NOTE — Patient Instructions (Signed)
Go to lab for blood draw.

## 2019-03-22 NOTE — Assessment & Plan Note (Signed)
LDL at 125 with 72yrs cardiovascular rist at 7.5%. Did not start pravastatin as recommended

## 2019-03-22 NOTE — Assessment & Plan Note (Addendum)
BP not at goal with coreg and amlodipine. Home BP readings: 140s/90s Reports compliance with CPAP machine, last seen by pulmonology 2019. States Jennifer [name] has made changes to diet: low carb and low sodium No exercise regimen at this time. No headache, no dizziness, no LE edema, no palipitations  Start benicar F/up in 43months BP Readings from Last 3 Encounters:  03/22/19 (!) 150/100  01/18/19 130/86  12/07/18 (!) 134/94

## 2019-03-23 ENCOUNTER — Encounter: Payer: Self-pay | Admitting: Nurse Practitioner

## 2019-04-16 ENCOUNTER — Ambulatory Visit (HOSPITAL_BASED_OUTPATIENT_CLINIC_OR_DEPARTMENT_OTHER)
Admission: RE | Admit: 2019-04-16 | Discharge: 2019-04-16 | Disposition: A | Discharge status: Home / Self Care | Payer: Managed Care, Other (non HMO) | Source: Ambulatory Visit | Attending: Nurse Practitioner | Admitting: Nurse Practitioner

## 2019-04-16 ENCOUNTER — Other Ambulatory Visit: Payer: Self-pay

## 2019-04-16 DIAGNOSIS — Z1231 Encounter for screening mammogram for malignant neoplasm of breast: Secondary | ICD-10-CM | POA: Diagnosis not present

## 2019-04-19 MED FILL — CARVEDILOL 25 MG TABLET: 25 | 30 days supply | Qty: 60 | Fill #3

## 2019-04-19 MED FILL — FLUTICASONE PROP 50 MCG SPR: 50 | 30 days supply | Qty: 16 | Fill #4

## 2019-05-21 ENCOUNTER — Ambulatory Visit: Payer: Managed Care, Other (non HMO) | Attending: Internal Medicine

## 2019-05-21 DIAGNOSIS — Z23 Encounter for immunization: Secondary | ICD-10-CM

## 2019-05-21 NOTE — Progress Notes (Signed)
   Covid-19 Vaccination Clinic  Name:  Jennifer [name]    MRN: [id number] DOB: 1960/10/26  05/21/2019  [name] was observed post Covid-19 immunization for 15 minutes without incident. She was provided with Vaccine Information Sheet and instruction to access the V-Safe system.   [name] was instructed to call 911 with any severe reactions post vaccine: Marland Kitchen Difficulty breathing  . Swelling of face and throat  . A fast heartbeat  . A bad rash all over body  . Dizziness and weakness   Immunizations Administered    Name Date Dose VIS Date Route   Pfizer COVID-19 Vaccine 05/21/2019  3:10 PM 0.3 mL 01/22/2019 Intramuscular   Manufacturer: [place]   Lot: [id number]   [place]: [id number]

## 2019-05-25 MED FILL — CARVEDILOL 25 MG TABLET: 25 | 30 days supply | Qty: 60 | Fill #4

## 2019-05-25 MED FILL — OLMESARTAN MEDOXOMIL 20 MG: 20 | 30 days supply | Qty: 15 | Fill #1

## 2019-06-14 ENCOUNTER — Ambulatory Visit: Payer: Managed Care, Other (non HMO) | Attending: Internal Medicine

## 2019-06-14 DIAGNOSIS — Z23 Encounter for immunization: Secondary | ICD-10-CM

## 2019-06-14 NOTE — Progress Notes (Signed)
   Covid-19 Vaccination Clinic  Name:  Jennifer [name]    MRN: [id number] DOB: December 17, 1960  06/14/2019  Jennifer [name] was observed post Covid-19 immunization for 15 minutes without incident. She was provided with Vaccine Information Sheet and instruction to access the V-Safe system.   Jennifer [name] was instructed to call 911 with any severe reactions post vaccine: Marland Kitchen Difficulty breathing  . Swelling of face and throat  . A fast heartbeat  . A bad rash all over body  . Dizziness and weakness   Immunizations Administered    Name Date Dose VIS Date Route   Pfizer COVID-19 Vaccine 06/14/2019 11:19 AM 0.3 mL 04/07/2018 Intramuscular   Manufacturer: [place]   Lot: [id number]   [place]: [id number]

## 2019-06-23 ENCOUNTER — Encounter: Payer: Self-pay | Admitting: Nurse Practitioner

## 2019-06-23 ENCOUNTER — Ambulatory Visit (INDEPENDENT_AMBULATORY_CARE_PROVIDER_SITE_OTHER): Payer: Managed Care, Other (non HMO) | Admitting: Nurse Practitioner

## 2019-06-23 ENCOUNTER — Other Ambulatory Visit: Payer: Self-pay

## 2019-06-23 ENCOUNTER — Other Ambulatory Visit: Payer: Self-pay | Admitting: Nurse Practitioner

## 2019-06-23 VITALS — BP 138/90 | HR 64 | Temp 96.4°F | Ht 62.0 in | Wt 246.6 lb

## 2019-06-23 DIAGNOSIS — E7800 Pure hypercholesterolemia, unspecified: Secondary | ICD-10-CM | POA: Diagnosis not present

## 2019-06-23 DIAGNOSIS — I10 Essential (primary) hypertension: Secondary | ICD-10-CM | POA: Diagnosis not present

## 2019-06-23 MED ORDER — AMLODIPINE BESYLATE 10 MG PO TABS: 10.0000 mg | ORAL_TABLET | Freq: Every day | ORAL | 5 refills | Status: DC

## 2019-06-23 MED ORDER — CARVEDILOL 25 MG PO TABS: ORAL_TABLET | ORAL | 3 refills | Status: DC

## 2019-06-23 NOTE — Assessment & Plan Note (Signed)
Improved BP She is not sure when she stopped amlodipine. Reports home BP 130s-140s/80s.  Maintain coreg and olmesartan Refill amlodipine, given instructions to resume if BP>150/90 F/up 1months

## 2019-06-23 NOTE — Assessment & Plan Note (Signed)
She declined to take pravastatin.

## 2019-06-23 NOTE — Patient Instructions (Addendum)
Maintain coreg and olmesartan Refill amlodipine, given instructions to resume if BP>150/90. Maintain DASH diet and daily exercise (30-2mins).  Schedule annual eye exam and dental cleaning every 22months.  DASH Eating Plan DASH stands for "Dietary Approaches to Stop Hypertension." The DASH eating plan is a healthy eating plan that has been shown to reduce high blood pressure (hypertension). It may also reduce your risk for type 2 diabetes, heart disease, and stroke. The DASH eating plan may also help with weight loss. What are tips for following this plan?  General guidelines  Avoid eating more than 2,300 mg (milligrams) of salt (sodium) a day. If you have hypertension, you may need to reduce your sodium intake to 1,500 mg a day.  Limit alcohol intake to no more than 1 drink a day for nonpregnant women and 2 drinks a day for men. One drink equals 12 oz of beer, 5 oz of wine, or 1 oz of hard liquor.  Work with your health care provider to maintain a healthy body weight or to lose weight. Ask what an ideal weight is for you.  Get at least 30 minutes of exercise that causes your heart to beat faster (aerobic exercise) most days of the week. Activities may include walking, swimming, or biking.  Work with your health care provider or diet and nutrition specialist (dietitian) to adjust your eating plan to your individual calorie needs. Reading food labels   Check food labels for the amount of sodium per serving. Choose foods with less than 5 percent of the Daily Value of sodium. Generally, foods with less than 300 mg of sodium per serving fit into this eating plan.  To find whole grains, look for the word "whole" as the first word in the ingredient list. Shopping  Buy products labeled as "low-sodium" or "no salt added."  Buy fresh foods. Avoid canned foods and premade or frozen meals. Cooking  Avoid adding salt when cooking. Use salt-free seasonings or herbs instead of table salt or sea  salt. Check with your health care provider or pharmacist before using salt substitutes.  Do not fry foods. Cook foods using healthy methods such as baking, boiling, grilling, and broiling instead.  Cook with heart-healthy oils, such as olive, canola, soybean, or sunflower oil. Meal planning  Eat a balanced diet that includes: ? 5 or more servings of fruits and vegetables each day. At each meal, try to fill half of your plate with fruits and vegetables. ? Up to 6-8 servings of whole grains each day. ? Less than 6 oz of lean meat, poultry, or fish each day. A 3-oz serving of meat is about the same size as a deck of cards. One egg equals 1 oz. ? 2 servings of low-fat dairy each day. ? A serving of nuts, seeds, or beans 5 times each week. ? Heart-healthy fats. Healthy fats called Omega-3 fatty acids are found in foods such as flaxseeds and coldwater fish, like sardines, salmon, and mackerel.  Limit how much you eat of the following: ? Canned or prepackaged foods. ? Food that is high in trans fat, such as fried foods. ? Food that is high in saturated fat, such as fatty meat. ? Sweets, desserts, sugary drinks, and other foods with added sugar. ? Full-fat dairy products.  Do not salt foods before eating.  Try to eat at least 2 vegetarian meals each week.  Eat more home-cooked food and less restaurant, buffet, and fast food.  When eating at a restaurant, ask that  your food be prepared with less salt or no salt, if possible. What foods are recommended? The items listed may not be a complete list. Talk with your dietitian about what dietary choices are best for you. Grains Whole-grain or whole-wheat bread. Whole-grain or whole-wheat pasta. Brown rice. [place]. Bulgur. Whole-grain and low-sodium cereals. Pita bread. Low-fat, low-sodium crackers. Whole-wheat flour tortillas. Vegetables Fresh or frozen vegetables (raw, steamed, roasted, or grilled). Low-sodium or reduced-sodium tomato  and vegetable juice. Low-sodium or reduced-sodium tomato sauce and tomato paste. Low-sodium or reduced-sodium canned vegetables. Fruits All fresh, dried, or frozen fruit. Canned fruit in natural juice (without added sugar). Meat and other protein foods Skinless chicken or [place]. Ground chicken or [place]. Pork with fat trimmed off. Fish and seafood. Egg whites. Dried beans, peas, or lentils. Unsalted nuts, nut butters, and seeds. Unsalted canned beans. Lean cuts of beef with fat trimmed off. Low-sodium, lean deli meat. Dairy Low-fat (1%) or fat-free (skim) milk. Fat-free, low-fat, or reduced-fat cheeses. Nonfat, low-sodium ricotta or cottage cheese. Low-fat or nonfat yogurt. Low-fat, low-sodium cheese. Fats and oils Soft margarine without trans fats. Vegetable oil. Low-fat, reduced-fat, or light mayonnaise and salad dressings (reduced-sodium). Canola, safflower, olive, soybean, and sunflower oils. Avocado. Seasoning and other foods Herbs. Spices. Seasoning mixes without salt. Unsalted popcorn and pretzels. Fat-free sweets. What foods are not recommended? The items listed may not be a complete list. Talk with your dietitian about what dietary choices are best for you. Grains Baked goods made with fat, such as croissants, muffins, or some breads. Dry pasta or rice meal packs. Vegetables Creamed or fried vegetables. Vegetables in a cheese sauce. Regular canned vegetables (not low-sodium or reduced-sodium). Regular canned tomato sauce and paste (not low-sodium or reduced-sodium). Regular tomato and vegetable juice (not low-sodium or reduced-sodium). Angie Fava. Olives. Fruits Canned fruit in a light or heavy syrup. Fried fruit. Fruit in cream or butter sauce. Meat and other protein foods Fatty cuts of meat. Ribs. Fried meat. Berniece Salines. Sausage. Bologna and other processed lunch meats. Salami. Fatback. Hotdogs. Bratwurst. Salted nuts and seeds. Canned beans with added salt. Canned or smoked fish. Whole eggs  or egg yolks. Chicken or [place] with skin. Dairy Whole or 2% milk, cream, and half-and-half. Whole or full-fat cream cheese. Whole-fat or sweetened yogurt. Full-fat cheese. Nondairy creamers. Whipped toppings. Processed cheese and cheese spreads. Fats and oils Butter. Stick margarine. Lard. Shortening. Ghee. Bacon fat. Tropical oils, such as coconut, palm kernel, or palm oil. Seasoning and other foods Salted popcorn and pretzels. Onion salt, garlic salt, seasoned salt, table salt, and sea salt. Worcestershire sauce. Tartar sauce. Barbecue sauce. Teriyaki sauce. Soy sauce, including reduced-sodium. Steak sauce. Canned and packaged gravies. Fish sauce. Oyster sauce. Cocktail sauce. Horseradish that you find on the shelf. Ketchup. Mustard. Meat flavorings and tenderizers. Bouillon cubes. Hot sauce and Tabasco sauce. Premade or packaged marinades. Premade or packaged taco seasonings. Relishes. Regular salad dressings. Where to find more information:  National Heart, Lung, and [place]: https://wilson-eaton.com/  American Heart Association: www.heart.org Summary  The DASH eating plan is a healthy eating plan that has been shown to reduce high blood pressure (hypertension). It may also reduce your risk for type 2 diabetes, heart disease, and stroke.  With the DASH eating plan, you should limit salt (sodium) intake to 2,300 mg a day. If you have hypertension, you may need to reduce your sodium intake to 1,500 mg a day.  When on the DASH eating plan, aim to eat more fresh fruits and  vegetables, whole grains, lean proteins, low-fat dairy, and heart-healthy fats.  Work with your health care provider or diet and nutrition specialist (dietitian) to adjust your eating plan to your individual calorie needs. This information is not intended to replace advice given to you by your health care provider. Make sure you discuss any questions you have with your health care provider. Document Revised: 01/10/2017  Document Reviewed: 01/22/2016 Elsevier Patient Education  2020 [employer].

## 2019-06-23 NOTE — Progress Notes (Signed)
Subjective:  Patient ID: Jennifer [name], female    DOB: [birth year]  Age: 59 y.o. MRN: [id number]  CC: Follow-up (HTN and hyperlipidemia)  HPI HTN: Improved BP Current use of olmesartan and coreg She is not sure when she stopped amlodipine. Reports home BP 130s-140s/80s. BP Readings from Last 3 Encounters:  06/23/19 138/90  03/22/19 (!) 150/100  01/18/19 130/86   Hyperlipidemia: She declined to take pravastatin. She is aware of ASCVD risk of 7.5% She prefers to work of lifestyle chnages to improve HgbA1c and LDL. Lipid Panel     Component Value Date/Time   CHOL 185 03/22/2019 1007   TRIG 44.0 03/22/2019 1007   HDL 47.60 03/22/2019 1007   CHOLHDL 4 03/22/2019 1007   VLDL 8.8 03/22/2019 1007   LDLCALC 128 (H) 03/22/2019 1007   Reviewed past Medical, Social and Family history today.  Outpatient Medications Prior to Visit  Medication Sig Dispense Refill  . Albuterol Sulfate (PROAIR RESPICLICK) 123XX123 (90 Base) MCG/ACT AEPB Inhale 1 puff into the lungs every 6 (six) hours as needed. 1 each 0  . fluticasone (FLONASE) 50 MCG/ACT nasal spray Place 2 sprays into both nostrils daily. 16 g 5  . MAGNESIUM PO Take by mouth.    . Multiple Vitamin (MULTIVITAMIN WITH MINERALS) TABS tablet Take 1 tablet by mouth at bedtime. ALIVE multi-vitamin    . olmesartan (BENICAR) 20 MG tablet Take 0.5 tablets (10 mg total) by mouth every morning. 45 tablet 1  . OVER THE COUNTER MEDICATION Herbal Tea daily    . carvedilol (COREG) 25 MG tablet TAKE 1 TABLET BY MOUTH TWICE A DAY WITH MEALS 180 tablet 1  . amLODipine (NORVASC) 10 MG tablet Take 1 tablet (10 mg total) by mouth at bedtime. (Patient not taking: Reported on 06/23/2019) 90 tablet 3   No facility-administered medications prior to visit.    ROS See HPI  Objective:  BP 138/90   Pulse 64   Temp (!) 96.4 F (35.8 C) (Tympanic)   Ht 5\' 2"  (1.575 m)   Wt 246 lb 9.6 oz (111.9 kg)   SpO2 98%   BMI 45.10 kg/m   BP Readings from Last 3  Encounters:  06/23/19 138/90  03/22/19 (!) 150/100  01/18/19 130/86    Wt Readings from Last 3 Encounters:  06/23/19 246 lb 9.6 oz (111.9 kg)  03/22/19 245 lb 3.2 oz (111.2 kg)  01/18/19 243 lb (110.2 kg)   Physical Exam Vitals reviewed.  Constitutional:      Appearance: She is obese.  Cardiovascular:     Rate and Rhythm: Normal rate and regular rhythm.     Pulses: Normal pulses.     Heart sounds: Normal heart sounds.  Pulmonary:     Effort: Pulmonary effort is normal.     Breath sounds: Normal breath sounds.  Musculoskeletal:     Right lower leg: No edema.     Left lower leg: No edema.  Neurological:     Mental Status: She is alert and oriented to person, place, and time.     Lab Results  Component Value Date   WBC 6.2 12/02/2018   HGB 12.0 12/02/2018   HCT 36.6 12/02/2018   PLT 284.0 12/02/2018   GLUCOSE 96 03/22/2019   CHOL 185 03/22/2019   TRIG 44.0 03/22/2019   HDL 47.60 03/22/2019   LDLCALC 128 (H) 03/22/2019   ALT 13 01/06/2017   AST 14 01/06/2017   NA 139 03/22/2019   K 3.5 03/22/2019  CL 104 03/22/2019   CREATININE 0.72 03/22/2019   BUN 13 03/22/2019   CO2 29 03/22/2019   TSH 1.34 12/02/2018   INR 1.0 12/11/2007   HGBA1C 5.7 03/22/2019    Assessment & Plan:  This visit occurred during the SARS-CoV-2 public health emergency.  Safety protocols were in place, including screening questions prior to the visit, additional usage of staff PPE, and extensive cleaning of exam room while observing appropriate contact time as indicated for disinfecting solutions.   Jennifer [name] was seen today for follow-up.  Diagnoses and all orders for this visit:  Essential hypertension -     amLODipine (NORVASC) 10 MG tablet; Take 1 tablet (10 mg total) by mouth at bedtime. Resume if BP>150/90 -     carvedilol (COREG) 25 MG tablet; TAKE 1 TABLET BY MOUTH TWICE A DAY WITH MEALS  Elevated LDL cholesterol level   I have changed Jennifer [name] amLODipine. I am also  having her maintain her multivitamin with minerals, OVER THE COUNTER MEDICATION, Albuterol Sulfate, fluticasone, MAGNESIUM PO, olmesartan, and carvedilol.  Meds ordered this encounter  Medications  . amLODipine (NORVASC) 10 MG tablet    Sig: Take 1 tablet (10 mg total) by mouth at bedtime. Resume if BP>150/90    Dispense:  30 tablet    Refill:  5    Order Specific Question:   Supervising Provider    Answer:   Ronnald Nian [id number]  . carvedilol (COREG) 25 MG tablet    Sig: TAKE 1 TABLET BY MOUTH TWICE A DAY WITH MEALS    Dispense:  180 tablet    Refill:  3    Order Specific Question:   Supervising Provider    Answer:   Ronnald Nian [id number]    Problem List Items Addressed This Visit      Cardiovascular and Mediastinum   Essential hypertension - Primary    Improved BP She is not sure when she stopped amlodipine. Reports home BP 130s-140s/80s.  Maintain coreg and olmesartan Refill amlodipine, given instructions to resume if BP>150/90 F/up 48months      Relevant Medications   amLODipine (NORVASC) 10 MG tablet   carvedilol (COREG) 25 MG tablet     Other   Elevated LDL cholesterol level    She declined to take pravastatin.          Follow-up: Return in about 3 months (around 09/23/2019) for HTN and , hyperlipidemia (fasting).  Jennifer Lacy, NP

## 2019-08-05 ENCOUNTER — Ambulatory Visit (INDEPENDENT_AMBULATORY_CARE_PROVIDER_SITE_OTHER): Payer: Managed Care, Other (non HMO) | Admitting: Sports Medicine

## 2019-08-05 ENCOUNTER — Other Ambulatory Visit: Payer: Self-pay

## 2019-08-05 ENCOUNTER — Ambulatory Visit
Admission: RE | Admit: 2019-08-05 | Discharge: 2019-08-05 | Disposition: A | Discharge status: Home / Self Care | Payer: Managed Care, Other (non HMO) | Source: Ambulatory Visit | Attending: Sports Medicine | Admitting: Sports Medicine

## 2019-08-05 VITALS — BP 139/91 | Ht 62.0 in | Wt 244.0 lb

## 2019-08-05 DIAGNOSIS — M25562 Pain in left knee: Secondary | ICD-10-CM

## 2019-08-05 MED ORDER — METHYLPREDNISOLONE ACETATE 40 MG/ML IJ SUSP
40.0000 mg | Freq: Once | INTRAMUSCULAR | Status: AC
  Administered 2019-08-05: 40 mg via INTRA_ARTICULAR

## 2019-08-05 NOTE — Progress Notes (Addendum)
  Jennifer [name] - 59 y.o. female MRN [id number]  Date of birth: 11/05/1960  SUBJECTIVE:   CC: left knee pain  59 year old female presenting with left knee pain for the past 3 days. She reports that the pain flared up during a walk, no inciting injury. Pain is worse on medial side. It has been painful and difficult to walk.  She reports that she has had intermittent pain in both knees for the past several years, with left worse than right.  She was seen in December for the same pain she has today and it improved greatly with a corticosteroid injection.  She would like to get a repeat injection today if possible.   ROS: No swelling, instability, muscle pain, numbness/tingling, redness, otherwise see HPI    DATA REVIEWED: Prior records  PHYSICAL EXAM:  VS: BP:(!) 139/91  HR: bpm  TEMP: ( )  RESP:   HT:5\' 2"  (157.5 cm)   WT:244 lb (110.7 kg)  BMI:44.62 PHYSICAL EXAM: Gen: NAD, alert, cooperative with exam, well-appearing HEENT: clear conjunctiva,  CV:  no edema, capillary refill brisk, normal rate Resp: non-labored Skin: no rashes, normal turgor  Neuro: no gross deficits.  Psych:  alert and oriented  Left Knee: - Inspection: no gross deformity. No swelling/effusion, erythema or bruising. Skin intact - Palpation:TTP along medial joint line - ROM: full active ROM with flexion and extension in knee and hip - Strength: 5/5 strength - Neuro/vasc: NV intact - Special Tests: - LIGAMENTS: negative anterior and posterior drawer, no MCL or LCL laxity  -- MENISCUS: negative McMurray's -- PF JOINT: nml patellar mobility bilaterally.    ASSESSMENT & PLAN:   84-year-old female presenting with chronic intermittent left knee pain.  Her knee pain most recently flared during a walk, without any acute injury.  She had improvement in December with a corticosteroid injection and requests repeat injection today.  Will obtain x-rays of her knees to evaluate for degree of arthritis contributing to  pain.  Follow-up as needed.  Procedure performed: knee intraarticular corticosteroid injection  Consent obtained and verified. Time-out conducted. Noted no overlying erythema, induration, or other signs of local infection. The left superolateral patellar space was palpated and marked. The overlying skin was prepped in a sterile fashion. Topical analgesic spray: Ethyl chloride. Joint: left knee Needle: 25 gauge, 1.5 inch Completed without difficulty. Meds: 40 mg methylrprednisolone, 3 ml 1% lidocaine without epinephrine  Advised to call if fevers/chills, erythema, induration, drainage, or persistent bleeding.  Patient seen and evaluated with the sports medicine fellow.  I agree with the above plan of care.  Phone follow-up with x-ray results when available.

## 2019-08-10 ENCOUNTER — Ambulatory Visit: Payer: Managed Care, Other (non HMO) | Admitting: Family Medicine

## 2019-08-12 ENCOUNTER — Other Ambulatory Visit: Payer: Self-pay | Admitting: Nurse Practitioner

## 2019-08-12 DIAGNOSIS — J302 Other seasonal allergic rhinitis: Secondary | ICD-10-CM

## 2019-08-12 MED FILL — CARVEDILOL 25 MG TABLET: 25 | 30 days supply | Qty: 60 | Fill #1

## 2019-08-13 ENCOUNTER — Other Ambulatory Visit: Payer: Self-pay | Admitting: Nurse Practitioner

## 2019-08-13 MED FILL — FLUTICASONE PROP 50 MCG SPR: 50 | 30 days supply | Qty: 16 | Fill #0

## 2019-08-31 ENCOUNTER — Encounter: Payer: Self-pay | Admitting: Family

## 2019-08-31 ENCOUNTER — Ambulatory Visit (INDEPENDENT_AMBULATORY_CARE_PROVIDER_SITE_OTHER): Payer: Managed Care, Other (non HMO) | Admitting: Family

## 2019-08-31 ENCOUNTER — Other Ambulatory Visit: Payer: Self-pay

## 2019-08-31 VITALS — BP 124/82 | HR 66 | Temp 96.5°F | Ht 62.0 in | Wt 244.8 lb

## 2019-08-31 DIAGNOSIS — M545 Low back pain, unspecified: Secondary | ICD-10-CM

## 2019-08-31 DIAGNOSIS — S39012A Strain of muscle, fascia and tendon of lower back, initial encounter: Secondary | ICD-10-CM

## 2019-08-31 DIAGNOSIS — M25562 Pain in left knee: Secondary | ICD-10-CM

## 2019-08-31 LAB — POCT URINALYSIS DIPSTICK
Bilirubin, UA: NEGATIVE
Glucose, UA: NEGATIVE
Ketones, UA: NEGATIVE
Leukocytes, UA: NEGATIVE
Nitrite, UA: NEGATIVE
Protein, UA: POSITIVE — AB
Spec Grav, UA: >=1.03 — AB (ref 1.010–1.025)
Urobilinogen, UA: 1 E.U./dL
pH, UA: 6 (ref 5.0–8.0)

## 2019-08-31 MED ORDER — CYCLOBENZAPRINE HCL 10 MG PO TABS: 10.0000 mg | ORAL_TABLET | Freq: Three times a day (TID) | ORAL | 0 refills | Status: DC | PRN

## 2019-08-31 MED ORDER — DICLOFENAC SODIUM 75 MG PO TBEC: 75.0000 mg | DELAYED_RELEASE_TABLET | Freq: Two times a day (BID) | ORAL | 0 refills | Status: DC

## 2019-08-31 MED FILL — CYCLOBENZAPRINE HCL 10 MG T: 10 | 10 days supply | Qty: 30 | Fill #0

## 2019-08-31 MED FILL — DICLOFENAC SODIUM 75 MG TAB: 75 | 15 days supply | Qty: 30 | Fill #0

## 2019-08-31 NOTE — Patient Instructions (Signed)
Lumbar Strain A lumbar strain, which is sometimes called a low-back strain, is a stretch or tear in a muscle or the strong cords of tissue that attach muscle to bone (tendons) in the lower back (lumbar spine). This type of injury occurs when muscles or tendons are torn or are stretched beyond their limits. Lumbar strains can range from mild to severe. Mild strains may involve stretching a muscle or tendon without tearing it. These may heal in 1-2 weeks. More severe strains involve tearing of muscle fibers or tendons. These will cause more pain and may take 6-8 weeks to heal. What are the causes? This condition may be caused by:  Trauma, such as a fall or a hit to the body.  Twisting or overstretching the back. This may result from doing activities that need a lot of energy, such as lifting heavy objects. What increases the risk? This injury is more common in:  Athletes.  People with obesity.  People who do repeated lifting, bending, or other movements that involve their back. What are the signs or symptoms? Symptoms of this condition may include:  Sharp or dull pain in the lower back that does not go away. The pain may extend to the buttocks.  Stiffness or limited range of motion.  Sudden muscle tightening (spasms). How is this diagnosed? This condition may be diagnosed based on:  Your symptoms.  Your medical history.  A physical exam.  Imaging tests, such as: ? X-rays. ? MRI. How is this treated? Treatment for this condition may include:  Rest.  Applying heat and cold to the affected area.  Over-the-counter medicines to help relieve pain and inflammation, such as NSAIDs.  Prescription pain medicine and muscle relaxants may be needed for a short time.  Physical therapy. Follow these instructions at home: Managing pain, stiffness, and swelling      If directed, put ice on the injured area during the first 24 hours after your injury. ? Put ice in a plastic  bag. ? Place a towel between your skin and the bag. ? Leave the ice on for 20 minutes, 2-3 times a day.  If directed, apply heat to the affected area as often as told by your health care provider. Use the heat source that your health care provider recommends, such as a moist heat pack or a heating pad. ? Place a towel between your skin and the heat source. ? Leave the heat on for 20-30 minutes. ? Remove the heat if your skin turns bright red. This is especially important if you are unable to feel pain, heat, or cold. You may have a greater risk of getting burned. Activity  Rest and return to your normal activities as told by your health care provider. Ask your health care provider what activities are safe for you.  Do exercises as told by your health care provider. Medicines  Take over-the-counter and prescription medicines only as told by your health care provider.  Ask your health care provider if the medicine prescribed to you: ? Requires you to avoid driving or using heavy machinery. ? Can cause constipation. You may need to take these actions to prevent or treat constipation:  Drink enough fluid to keep your urine pale yellow.  Take over-the-counter or prescription medicines.  Eat foods that are high in fiber, such as beans, whole grains, and fresh fruits and vegetables.  Limit foods that are high in fat and processed sugars, such as fried or sweet foods. Injury prevention To prevent   a future low-back injury:  Always warm up properly before physical activity or sports.  Cool down and stretch after being active.  Use correct form when playing sports and lifting heavy objects. Bend your knees before you lift heavy objects.  Use good posture when sitting and standing.  Stay physically fit and keep a healthy weight. ? Do at least 150 minutes of moderate-intensity exercise each week, such as brisk walking or water aerobics. ? Do strength exercises at least 2 times each  week.  General instructions  Do not use any products that contain nicotine or tobacco, such as cigarettes, e-cigarettes, and chewing tobacco. If you need help quitting, ask your health care provider.  Keep all follow-up visits as told by your health care provider. This is important. Contact a health care provider if:  Your back pain does not improve after 6 weeks of treatment.  Your symptoms get worse. Get help right away if:  Your back pain is severe.  You are unable to stand or walk.  You develop pain in your legs.  You develop weakness in your buttocks or legs.  You have difficulty controlling when you urinate or when you have a bowel movement. ? You have frequent, painful, or bloody urination. ? You have a temperature over 101.0F (38.3C) Summary  A lumbar strain, which is sometimes called a low-back strain, is a stretch or tear in a muscle or the strong cords of tissue that attach muscle to bone (tendons) in the lower back (lumbar spine).  This type of injury occurs when muscles or tendons are torn or are stretched beyond their limits.  Rest and return to your normal activities as told by your health care provider. If directed, apply heat and ice to the affected area as often as told by your health care provider.  Take over-the-counter and prescription medicines only as told by your health care provider.  Contact a health care provider if you have new or worsening symptoms. This information is not intended to replace advice given to you by your health care provider. Make sure you discuss any questions you have with your health care provider. Document Revised: 11/27/2017 Document Reviewed: 11/27/2017 Elsevier Patient Education  2020 [employer]  

## 2019-08-31 NOTE — Progress Notes (Signed)
Acute Office Visit  Subjective:    Patient ID: Jennifer [name], female    DOB: Jun 05, 1960, 59 y.o.   MRN: [id number]  Chief Complaint  Patient presents with  . Back Pain    right lower back pain near kidney//noticing shes not urinating as much-worried about kidneys//hurt since yesterday but got worse today//pt has pain patchelped some    HPI Patient is in today for 59 year old female is in today with concerns or right lower back pain that began yesterday. Pain is 6/10, worse with movement. Describes as a dull ache. Is unsure of any particular injury. Reports sleeping on her back and normally sleeps on her side. She has applied a "pain patch"  That she got from her daughter. Unsure of what the medication is but thinks it may be lidocaine. She became concerned as she feels she may not be urinating as much as she did the previous days. She wanted to be sure her kidneys were working.   Past Medical History:  Diagnosis Date  . Allergy    allergic rhinitis  . Anemia 06/2007   C-scope 06/2007: tics, neg hemocults 07/2007  . Anxiety   . Blood transfusion without reported diagnosis    at birth  . Chest pain 12/2007   negative ECHO, neg stress test 06/2008  . Condyloma acuminatum    history of  . Hyperglycemia    borderline. A1c 5.7  12/2007  . Hypertension   . Personal history of atrial fibrillation    on carvedilol only   . Sleep apnea    wears cpap at night    Past Surgical History:  Procedure Laterality Date  . ABDOMINAL HYSTERECTOMY     no oophorectomy  . BREAST EXCISIONAL BIOPSY    . BREAST SURGERY  age 68   Fibroid tumor removed right breast  . CARPAL TUNNEL RELEASE    . CESAREAN SECTION    . CHOLECYSTECTOMY    . COLONOSCOPY      Family History  Problem Relation Age of Onset  . Breast cancer Paternal Aunt   . Cervical cancer Cousin   . Diabetes Mother   . Coronary artery disease Father   . Kidney disease Father   . Diabetes Father   . Hypertension Father   .  Prostate cancer Father   . Coronary artery disease Sister   . Kidney disease Sister   . Diabetes Sister   . Hypertension Sister   . Kidney disease Brother   . Stroke Brother   . Hypertension Brother        x3  . Diabetes Maternal Grandmother   . Stroke Maternal Grandmother   . Diabetes Paternal Grandmother   . Stroke Paternal Grandmother   . Coronary artery disease Other        grandmother, uncle  . Kidney disease Other        grandmother  . Stroke Other        uncle  . Thyroid disease Other        aunts  . Glaucoma Other        grandmother  . Hypertension Other        grandmother  . Diabetes Other        aunts  . Hypothyroidism Other        aunt  . Hyperthyroidism Other        aunt  . Healthy Child   . Healthy Child   . Healthy Child   . Colon  cancer Neg Hx   . Colon polyps Neg Hx   . Esophageal cancer Neg Hx   . Rectal cancer Neg Hx   . Stomach cancer Neg Hx     Social History   Socioeconomic History  . Marital status: Divorced    Spouse name: Not on file  . Number of children: 3  . Years of education: Not on file  . Highest education level: Not on file  Occupational History    Employer: [employer]  Tobacco Use  . Smoking status: Former Smoker    Packs/day: 0.50    Years: 27.00    Pack years: 13.50    Types: Cigarettes    Quit date: 02/12/2004    Years since quitting: 15.5  . Smokeless tobacco: Never Used  Substance and Sexual Activity  . Alcohol use: No  . Drug use: No  . Sexual activity: Not Currently    Birth control/protection: Post-menopausal, Surgical    Comment: s/p hysterectomy  Other Topics Concern  . 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  Difficulty of Paying Living Expenses:   Food Insecurity:   . Worried About Charity fundraiser in the Last Year:   . Arboriculturist in the Last Year:   Transportation Needs:   . Film/video editor (Medical):   Marland Kitchen Lack of Transportation (Non-Medical):   Physical Activity:   . Days of Exercise per Week:   . Minutes of Exercise per Session:   Stress:   . Feeling of Stress :   Social Connections:   . Frequency of Communication with Friends and Family:   . Frequency of Social Gatherings with Friends and Family:   . Attends Religious Services:   . Active Member of Clubs or Organizations:   . Attends Archivist Meetings:   Marland Kitchen Marital Status:   Intimate Partner Violence:   . Fear of Current or Ex-Partner:   . Emotionally Abused:   Marland Kitchen Physically Abused:   . Sexually Abused:     Outpatient Medications Prior to Visit  Medication Sig Dispense Refill  . Albuterol Sulfate (PROAIR RESPICLICK) 378 (90 Base) MCG/ACT AEPB Inhale 1 puff into the lungs every 6 (six) hours as needed. 1 each 0  . amLODipine (NORVASC) 10 MG tablet Take 1 tablet (10 mg total) by mouth at bedtime. Resume if BP>150/90 30 tablet 5  . carvedilol (COREG) 25 MG tablet TAKE 1 TABLET BY MOUTH TWICE A DAY WITH MEALS 180 tablet 3  . fluticasone (FLONASE) 50 MCG/ACT nasal spray PLACE 2 SPRAYS INTO BOTH NOSTRILS DAILY. 16 g 5  . MAGNESIUM PO Take by mouth.    . Multiple Vitamin (MULTIVITAMIN WITH MINERALS) TABS tablet Take 1 tablet by mouth at bedtime. ALIVE multi-vitamin    . olmesartan (BENICAR) 20 MG tablet Take 0.5 tablets (10 mg total) by mouth every morning. 45 tablet 1  . OVER THE COUNTER MEDICATION Herbal Tea daily     No facility-administered medications prior to visit.    Allergies  Allergen Reactions  . Ace Inhibitors Cough  . Codeine Nausea And Vomiting    Review of Systems  Constitutional: Negative.   Respiratory: Negative.   Cardiovascular: Negative.   Gastrointestinal: Negative.  Negative for  abdominal pain.  Endocrine: Negative.   Genitourinary: Negative for dysuria, frequency, hematuria, pelvic pain and urgency.  Musculoskeletal: Positive for back pain. Negative for joint swelling and neck pain.  Skin: Negative.   Allergic/Immunologic: Negative.   Neurological: Negative.  Negative for weakness.  Psychiatric/Behavioral: Negative.        Objective:    Physical Exam Constitutional:      Appearance: Normal appearance. She is obese.  Cardiovascular:     Rate and Rhythm: Normal rate and regular rhythm.     Pulses: Normal pulses.     Heart sounds: Normal heart sounds.  Pulmonary:     Effort: Pulmonary effort is normal.     Breath sounds: Normal breath sounds.  Abdominal:     General: Abdomen is flat. Bowel sounds are normal.     Palpations: Abdomen is soft.  Musculoskeletal:        General: Tenderness present. No swelling.     Cervical back: Normal range of motion.     Right lower leg: No edema.     Left lower leg: No edema.     Comments: Right lower back tenderness to palpation and with rotation of the torso. Pain with SLR maneuver on the right. No pain with flexion and extension of the spine.   Skin:    General: Skin is warm and dry.  Neurological:     Mental Status: She is alert and oriented to person, place, and time.  Psychiatric:        Mood and Affect: Mood normal.     BP 124/82   Pulse 66   Temp (!) 96.5 F (35.8 C) (Tympanic)   Ht 5\' 2"  (1.575 m)   Wt 244 lb 12.8 oz (111 kg)   SpO2 100%   BMI 44.77 kg/m  Wt Readings from Last 3 Encounters:  08/31/19 244 lb 12.8 oz (111 kg)  08/05/19 244 lb (110.7 kg)  06/23/19 246 lb 9.6 oz (111.9 kg)    There are no preventive care reminders to display for this patient.  There are no preventive care reminders to display for this patient.   Lab Results  Component Value Date   TSH 1.34 12/02/2018   Lab Results  Component Value Date   WBC 6.2 12/02/2018   HGB 12.0 12/02/2018   HCT 36.6 12/02/2018    MCV 89.6 12/02/2018   PLT 284.0 12/02/2018   Lab Results  Component Value Date   NA 139 03/22/2019   K 3.5 03/22/2019   CO2 29 03/22/2019   GLUCOSE 96 03/22/2019   BUN 13 03/22/2019   CREATININE 0.72 03/22/2019   BILITOT 0.6 01/06/2017   ALKPHOS 72 01/06/2017   AST 14 01/06/2017   ALT 13 01/06/2017   PROT 7.2 01/06/2017   ALBUMIN 4.0 01/06/2017   CALCIUM 9.1 03/22/2019   ANIONGAP 6 10/08/2015   GFR 99.89 03/22/2019   Lab Results  Component Value Date   CHOL 185 03/22/2019   Lab Results  Component Value Date   HDL 47.60 03/22/2019   Lab Results  Component Value Date   LDLCALC 128 (H) 03/22/2019   Lab Results  Component Value Date   TRIG 44.0 03/22/2019   Lab Results  Component Value Date   CHOLHDL 4 03/22/2019   Lab Results  Component Value Date   HGBA1C 5.7 03/22/2019       Assessment & Plan:  Problem List Items Addressed This Visit    None    Visit Diagnoses    Acute right-sided low back pain without sciatica    -  Primary   Relevant Medications   cyclobenzaprine (FLEXERIL) 10 MG tablet   diclofenac (VOLTAREN) 75 MG EC tablet   Other Relevant Orders   POCT Urinalysis Dipstick (Completed)   Low back strain, initial encounter           Meds ordered this encounter  Medications  . cyclobenzaprine (FLEXERIL) 10 MG tablet    Sig: Take 1 tablet (10 mg total) by mouth 3 (three) times daily as needed for muscle spasms.    Dispense:  30 tablet    Refill:  0  . diclofenac (VOLTAREN) 75 MG EC tablet    Sig: Take 1 tablet (75 mg total) by mouth 2 (two) times daily.    Dispense:  30 tablet    Refill:  0   Call the office with any questions or concerns.   Kennyth Arnold, FNP

## 2019-09-24 MED FILL — OLMESARTAN MEDOXOMIL 20 MG: 20 | 30 days supply | Qty: 15 | Fill #2

## 2019-09-24 MED FILL — CARVEDILOL 25 MG TABLET: 25 | 30 days supply | Qty: 60 | Fill #2

## 2019-09-24 MED FILL — AMLODIPINE BESYLATE 10 MG T: 10 | 30 days supply | Qty: 30 | Fill #1

## 2019-09-29 ENCOUNTER — Ambulatory Visit: Payer: Managed Care, Other (non HMO) | Admitting: Nurse Practitioner

## 2019-10-03 ENCOUNTER — Other Ambulatory Visit: Payer: Managed Care, Other (non HMO)

## 2019-10-03 ENCOUNTER — Other Ambulatory Visit: Payer: Self-pay

## 2019-10-03 ENCOUNTER — Ambulatory Visit
Admission: RE | Admit: 2019-10-03 | Discharge: 2019-10-03 | Disposition: A | Discharge status: Home / Self Care | Payer: Managed Care, Other (non HMO) | Source: Ambulatory Visit | Attending: Sports Medicine | Admitting: Sports Medicine

## 2019-10-03 DIAGNOSIS — M25562 Pain in left knee: Secondary | ICD-10-CM

## 2019-10-05 ENCOUNTER — Other Ambulatory Visit: Payer: Self-pay

## 2019-10-05 ENCOUNTER — Ambulatory Visit (INDEPENDENT_AMBULATORY_CARE_PROVIDER_SITE_OTHER): Payer: Managed Care, Other (non HMO) | Admitting: Nurse Practitioner

## 2019-10-05 ENCOUNTER — Encounter: Payer: Self-pay | Admitting: Nurse Practitioner

## 2019-10-05 VITALS — BP 124/90 | HR 68 | Temp 97.1°F | Ht 62.0 in | Wt 249.0 lb

## 2019-10-05 DIAGNOSIS — E7800 Pure hypercholesterolemia, unspecified: Secondary | ICD-10-CM | POA: Diagnosis not present

## 2019-10-05 DIAGNOSIS — I10 Essential (primary) hypertension: Secondary | ICD-10-CM | POA: Diagnosis not present

## 2019-10-05 DIAGNOSIS — R739 Hyperglycemia, unspecified: Secondary | ICD-10-CM

## 2019-10-05 LAB — LIPID PANEL
Cholesterol: 198 mg/dL (ref 0–200)
HDL: 52.9 mg/dL (ref >39.00)
LDL Cholesterol: 134 mg/dL — ABNORMAL HIGH (ref 0–99)
NonHDL: 144.79
Total CHOL/HDL Ratio: 4
Triglycerides: 55 mg/dL (ref 0.0–149.0)
VLDL: 11 mg/dL (ref 0.0–40.0)

## 2019-10-05 LAB — HEMOGLOBIN A1C: Hgb A1c MFr Bld: 5.8 % (ref 4.6–6.5)

## 2019-10-05 NOTE — Assessment & Plan Note (Addendum)
BP at goal with coreg, amlodipine and benicar. 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 Reports she has made change to her diet (DASH diet) Limited exercise due to left knee pain. BP Readings from Last 3 Encounters:  10/05/19 124/90  08/31/19 124/82  08/05/19 (!) 139/91   Maintain current medications F/up in 96months

## 2019-10-05 NOTE — Assessment & Plan Note (Signed)
ASCVD risk of 6.3%. No statin needed at this time Reports she has made change to her diet (DASH diet) Lipid Panel     Component Value Date/Time   CHOL 185 03/22/2019 1007   TRIG 44.0 03/22/2019 1007   HDL 47.60 03/22/2019 1007   CHOLHDL 4 03/22/2019 1007   VLDL 8.8 03/22/2019 1007   LDLCALC 128 (H) 03/22/2019 1007   Repeat lipid panel today

## 2019-10-05 NOTE — Progress Notes (Signed)
Subjective:  Patient ID: Jennifer [name], female    DOB: 05-27-1960  Age: 59 y.o. MRN: [id number]  CC: Follow-up (3 month f/u for HTN and hyperlipidemia, patient is fasting today.)  HPI  Essential hypertension BP at goal with coreg, amlodipine and benicar. 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 BP Readings from Last 3 Encounters:  10/05/19 124/90  08/31/19 124/82  08/05/19 (!) 139/91   Maintain current medications F/up in 69months  Elevated LDL cholesterol level ASCVD risk of 6.3%. No statin needed at this time Reports she has made change to her diet (DASH diet) Lipid Panel     Component Value Date/Time   CHOL 185 03/22/2019 1007   TRIG 44.0 03/22/2019 1007   HDL 47.60 03/22/2019 1007   CHOLHDL 4 03/22/2019 1007   VLDL 8.8 03/22/2019 1007   LDLCALC 128 (H) 03/22/2019 1007   Repeat lipid panel today  Wt Readings from Last 3 Encounters:  10/05/19 249 lb (112.9 kg)  08/31/19 244 lb 12.8 oz (111 kg)  08/05/19 244 lb (110.7 kg)    Reviewed past Medical, Social and Family history today.  Outpatient Medications Prior to Visit  Medication Sig Dispense Refill   Albuterol Sulfate (PROAIR RESPICLICK) 509 (90 Base) MCG/ACT AEPB Inhale 1 puff into the lungs every 6 (six) hours as needed. 1 each 0   amLODipine (NORVASC) 10 MG tablet Take 1 tablet (10 mg total) by mouth at bedtime. Resume if BP>150/90 30 tablet 5   carvedilol (COREG) 25 MG tablet TAKE 1 TABLET BY MOUTH TWICE A DAY WITH MEALS 180 tablet 3   cyclobenzaprine (FLEXERIL) 10 MG tablet Take 1 tablet (10 mg total) by mouth 3 (three) times daily as needed for muscle spasms. 30 tablet 0   diclofenac (VOLTAREN) 75 MG EC tablet Take 1 tablet (75 mg total) by mouth 2 (two) times daily. 30 tablet 0   fluticasone (FLONASE) 50 MCG/ACT nasal spray PLACE 2 SPRAYS INTO BOTH NOSTRILS DAILY. 16 g 5   MAGNESIUM PO Take by mouth.     Multiple Vitamin (MULTIVITAMIN WITH MINERALS) TABS tablet  Take 1 tablet by mouth at bedtime. ALIVE multi-vitamin     olmesartan (BENICAR) 20 MG tablet Take 0.5 tablets (10 mg total) by mouth every morning. 45 tablet 1   OVER THE COUNTER MEDICATION Herbal Tea daily     No facility-administered medications prior to visit.    ROS See HPI  Objective:  BP 124/90 (BP Location: Left Arm, Patient Position: Sitting, Cuff Size: Normal)    Pulse 68    Temp (!) 97.1 F (36.2 C) (Temporal)    Ht 5\' 2"  (1.575 m)    Wt 249 lb (112.9 kg)    SpO2 100%    BMI 45.54 kg/m   Physical Exam Constitutional:      Appearance: She is obese.  Cardiovascular:     Rate and Rhythm: Normal rate and regular rhythm.     Pulses: Normal pulses.     Heart sounds: Normal heart sounds.  Pulmonary:     Effort: Pulmonary effort is normal.     Breath sounds: Normal breath sounds.  Musculoskeletal:     Right lower leg: Edema present.     Left lower leg: Edema present.  Skin:    General: Skin is warm and dry.     Findings: No erythema.  Neurological:     Mental Status: She is alert and oriented to person, place, and time.  Assessment & Plan:  This visit occurred during the SARS-CoV-2 public health emergency.  Safety protocols were in place, including screening questions prior to the visit, additional usage of staff PPE, and extensive cleaning of exam room while observing appropriate contact time as indicated for disinfecting solutions.   Dawnyel was seen today for follow-up.  Diagnoses and all orders for this visit:  Essential hypertension  Hyperglycemia -     Hemoglobin A1c  Elevated LDL cholesterol level -     Lipid panel   Problem List Items Addressed This Visit      Cardiovascular and Mediastinum   Essential hypertension - Primary    BP at goal with coreg, amlodipine and benicar. 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 BP Readings from Last 3 Encounters:  10/05/19 124/90  08/31/19 124/82  08/05/19 (!) 139/91    Maintain current medications F/up in 39months        Other   Elevated LDL cholesterol level    ASCVD risk of 6.3%. No statin needed at this time Reports she has made change to her diet (DASH diet) Lipid Panel     Component Value Date/Time   CHOL 185 03/22/2019 1007   TRIG 44.0 03/22/2019 1007   HDL 47.60 03/22/2019 1007   CHOLHDL 4 03/22/2019 1007   VLDL 8.8 03/22/2019 1007   LDLCALC 128 (H) 03/22/2019 1007   Repeat lipid panel today      Relevant Orders   Lipid panel   Hyperglycemia   Relevant Orders   Hemoglobin A1c      Follow-up: Return in about 6 months (around 04/06/2020) for CPE (fasting).  Wilfred Lacy, NP

## 2019-10-05 NOTE — Patient Instructions (Signed)
Go to lab for blood draw  Continue non weight bearing exercise and DASH diet.   Calorie Counting for Weight Loss Calories are units of energy. Your body needs a certain amount of calories from food to keep you going throughout the day. When you eat more calories than your body needs, your body stores the extra calories as fat. When you eat fewer calories than your body needs, your body burns fat to get the energy it needs. Calorie counting means keeping track of how many calories you eat and drink each day. Calorie counting can be helpful if you need to lose weight. If you make sure to eat fewer calories than your body needs, you should lose weight. Ask your health care provider what a healthy weight is for you. For calorie counting to work, you will need to eat the right number of calories in a day in order to lose a healthy amount of weight per week. A dietitian can help you determine how many calories you need in a day and will give you suggestions on how to reach your calorie goal.  A healthy amount of weight to lose per week is usually 1-2 lb (0.5-0.9 kg). This usually means that your daily calorie intake should be reduced by 500-750 calories.  Eating 1,200 - 1,500 calories per day can help most women lose weight.  Eating 1,500 - 1,800 calories per day can help most men lose weight. What is my plan? My goal is to have __________ calories per day. If I have this many calories per day, I should lose around __________ pounds per week. What do I need to know about calorie counting? In order to meet your daily calorie goal, you will need to:  Find out how many calories are in each food you would like to eat. Try to do this before you eat.  Decide how much of the food you plan to eat.  Write down what you ate and how many calories it had. Doing this is called keeping a food log. To successfully lose weight, it is important to balance calorie counting with a healthy lifestyle that includes  regular activity. Aim for 150 minutes of moderate exercise (such as walking) or 75 minutes of vigorous exercise (such as running) each week. Where do I find calorie information?  The number of calories in a food can be found on a Nutrition Facts label. If a food does not have a Nutrition Facts label, try to look up the calories online or ask your dietitian for help. Remember that calories are listed per serving. If you choose to have more than one serving of a food, you will have to multiply the calories per serving by the amount of servings you plan to eat. For example, the label on a package of bread might say that a serving size is 1 slice and that there are 90 calories in a serving. If you eat 1 slice, you will have eaten 90 calories. If you eat 2 slices, you will have eaten 180 calories. How do I keep a food log? Immediately after each meal, record the following information in your food log:  What you ate. Don't forget to include toppings, sauces, and other extras on the food.  How much you ate. This can be measured in cups, ounces, or number of items.  How many calories each food and drink had.  The total number of calories in the meal. Keep your food log near you, such as in  a small notebook in your pocket, or use a mobile app or website. Some programs will calculate calories for you and show you how many calories you have left for the day to meet your goal. What are some calorie counting tips?   Use your calories on foods and drinks that will fill you up and not leave you hungry: ? Some examples of foods that fill you up are nuts and nut butters, vegetables, lean proteins, and high-fiber foods like whole grains. High-fiber foods are foods with more than 5 g fiber per serving. ? Drinks such as sodas, specialty coffee drinks, alcohol, and juices have a lot of calories, yet do not fill you up.  Eat nutritious foods and avoid empty calories. Empty calories are calories you get from foods  or beverages that do not have many vitamins or protein, such as candy, sweets, and soda. It is better to have a nutritious high-calorie food (such as an avocado) than a food with few nutrients (such as a bag of chips).  Know how many calories are in the foods you eat most often. This will help you calculate calorie counts faster.  Pay attention to calories in drinks. Low-calorie drinks include water and unsweetened drinks.  Pay attention to nutrition labels for "low fat" or "fat free" foods. These foods sometimes have the same amount of calories or more calories than the full fat versions. They also often have added sugar, starch, or salt, to make up for flavor that was removed with the fat.  Find a way of tracking calories that works for you. Get creative. Try different apps or programs if writing down calories does not work for you. What are some portion control tips?  Know how many calories are in a serving. This will help you know how many servings of a certain food you can have.  Use a measuring cup to measure serving sizes. You could also try weighing out portions on a kitchen scale. With time, you will be able to estimate serving sizes for some foods.  Take some time to put servings of different foods on your favorite plates, bowls, and cups so you know what a serving looks like.  Try not to eat straight from a bag or box. Doing this can lead to overeating. Put the amount you would like to eat in a cup or on a plate to make sure you are eating the right portion.  Use smaller plates, glasses, and bowls to prevent overeating.  Try not to multitask (for example, watch TV or use your computer) while eating. If it is time to eat, sit down at a table and enjoy your food. This will help you to know when you are full. It will also help you to be aware of what you are eating and how much you are eating. What are tips for following this plan? Reading food labels  Check the calorie count  compared to the serving size. The serving size may be smaller than what you are used to eating.  Check the source of the calories. Make sure the food you are eating is high in vitamins and protein and low in saturated and trans fats. Shopping  Read nutrition labels while you shop. This will help you make healthy decisions before you decide to purchase your food.  Make a grocery list and stick to it. Cooking  Try to cook your favorite foods in a healthier way. For example, try baking instead of frying.  Use low-fat  dairy products. Meal planning  Use more fruits and vegetables. Half of your plate should be fruits and vegetables.  Include lean proteins like poultry and fish. How do I count calories when eating out?  Ask for smaller portion sizes.  Consider sharing an entree and sides instead of getting your own entree.  If you get your own entree, eat only half. Ask for a box at the beginning of your meal and put the rest of your entree in it so you are not tempted to eat it.  If calories are listed on the menu, choose the lower calorie options.  Choose dishes that include vegetables, fruits, whole grains, low-fat dairy products, and lean protein.  Choose items that are boiled, broiled, grilled, or steamed. Stay away from items that are buttered, battered, fried, or served with cream sauce. Items labeled "crispy" are usually fried, unless stated otherwise.  Choose water, low-fat milk, unsweetened iced tea, or other drinks without added sugar. If you want an alcoholic beverage, choose a lower calorie option such as a glass of wine or light beer.  Ask for dressings, sauces, and syrups on the side. These are usually high in calories, so you should limit the amount you eat.  If you want a salad, choose a garden salad and ask for grilled meats. Avoid extra toppings like bacon, cheese, or fried items. Ask for the dressing on the side, or ask for olive oil and vinegar or lemon to use as  dressing.  Estimate how many servings of a food you are given. For example, a serving of cooked rice is  cup or about the size of half a baseball. Knowing serving sizes will help you be aware of how much food you are eating at restaurants. The list below tells you how big or small some common portion sizes are based on everyday objects: ? 1 oz--4 stacked dice. ? 3 oz--1 deck of cards. ? 1 tsp--1 die. ? 1 Tbsp-- a ping-pong ball. ? 2 Tbsp--1 ping-pong ball. ?  cup-- baseball. ? 1 cup--1 baseball. Summary  Calorie counting means keeping track of how many calories you eat and drink each day. If you eat fewer calories than your body needs, you should lose weight.  A healthy amount of weight to lose per week is usually 1-2 lb (0.5-0.9 kg). This usually means reducing your daily calorie intake by 500-750 calories.  The number of calories in a food can be found on a Nutrition Facts label. If a food does not have a Nutrition Facts label, try to look up the calories online or ask your dietitian for help.  Use your calories on foods and drinks that will fill you up, and not on foods and drinks that will leave you hungry.  Use smaller plates, glasses, and bowls to prevent overeating. This information is not intended to replace advice given to you by your health care provider. Make sure you discuss any questions you have with your health care provider. Document Revised: 10/17/2017 Document Reviewed: 12/29/2015 Elsevier Patient Education  [place].

## 2019-10-20 ENCOUNTER — Ambulatory Visit (INDEPENDENT_AMBULATORY_CARE_PROVIDER_SITE_OTHER): Payer: Managed Care, Other (non HMO) | Admitting: Family Medicine

## 2019-10-20 ENCOUNTER — Encounter: Payer: Self-pay | Admitting: Family Medicine

## 2019-10-20 ENCOUNTER — Other Ambulatory Visit: Payer: Self-pay

## 2019-10-20 ENCOUNTER — Encounter (HOSPITAL_COMMUNITY): Payer: Self-pay | Admitting: Emergency Medicine

## 2019-10-20 ENCOUNTER — Emergency Department (HOSPITAL_COMMUNITY)
Admission: EM | Admit: 2019-10-20 | Discharge: 2019-10-21 | Disposition: A | Discharge status: Home / Self Care | Payer: Managed Care, Other (non HMO) | Attending: Emergency Medicine | Admitting: Emergency Medicine

## 2019-10-20 VITALS — BP 161/85 | Ht 62.0 in | Wt 248.0 lb

## 2019-10-20 DIAGNOSIS — M545 Low back pain, unspecified: Secondary | ICD-10-CM

## 2019-10-20 DIAGNOSIS — R109 Unspecified abdominal pain: Secondary | ICD-10-CM | POA: Diagnosis not present

## 2019-10-20 DIAGNOSIS — I10 Essential (primary) hypertension: Secondary | ICD-10-CM | POA: Diagnosis not present

## 2019-10-20 DIAGNOSIS — Z7951 Long term (current) use of inhaled steroids: Secondary | ICD-10-CM | POA: Insufficient documentation

## 2019-10-20 DIAGNOSIS — M62838 Other muscle spasm: Secondary | ICD-10-CM | POA: Insufficient documentation

## 2019-10-20 DIAGNOSIS — R1110 Vomiting, unspecified: Secondary | ICD-10-CM | POA: Diagnosis not present

## 2019-10-20 DIAGNOSIS — Z87891 Personal history of nicotine dependence: Secondary | ICD-10-CM | POA: Diagnosis not present

## 2019-10-20 DIAGNOSIS — M549 Dorsalgia, unspecified: Secondary | ICD-10-CM | POA: Diagnosis present

## 2019-10-20 LAB — COMPREHENSIVE METABOLIC PANEL
ALT: 23 U/L (ref 0–44)
AST: 25 U/L (ref 15–41)
Albumin: 3.4 g/dL — ABNORMAL LOW (ref 3.5–5.0)
Alkaline Phosphatase: 72 U/L (ref 38–126)
Anion gap: 12 (ref 5–15)
BUN: 16 mg/dL (ref 6–20)
CO2: 22 mmol/L (ref 22–32)
Calcium: 8.9 mg/dL (ref 8.9–10.3)
Chloride: 105 mmol/L (ref 98–111)
Creatinine, Ser: 0.79 mg/dL (ref 0.44–1.00)
GFR calc Af Amer: >60 mL/min (ref >60)
GFR calc non Af Amer: >60 mL/min (ref >60)
Glucose, Bld: 142 mg/dL — ABNORMAL HIGH (ref 70–99)
Potassium: 3.5 mmol/L (ref 3.5–5.1)
Sodium: 139 mmol/L (ref 135–145)
Total Bilirubin: 0.3 mg/dL (ref 0.3–1.2)
Total Protein: 7.2 g/dL (ref 6.5–8.1)

## 2019-10-20 LAB — LIPASE, BLOOD: Lipase: 23 U/L (ref 11–51)

## 2019-10-20 LAB — CBC
HCT: 33.9 % — ABNORMAL LOW (ref 36.0–46.0)
Hemoglobin: 10.4 g/dL — ABNORMAL LOW (ref 12.0–15.0)
MCH: 28.4 pg (ref 26.0–34.0)
MCHC: 30.7 g/dL (ref 30.0–36.0)
MCV: 92.6 fL (ref 80.0–100.0)
Platelets: 320 10*3/uL (ref 150–400)
RBC: 3.66 MIL/uL — ABNORMAL LOW (ref 3.87–5.11)
RDW: 13.2 % (ref 11.5–15.5)
WBC: 8.5 10*3/uL (ref 4.0–10.5)
nRBC: 0 % (ref 0.0–0.2)

## 2019-10-20 LAB — URINALYSIS, ROUTINE W REFLEX MICROSCOPIC
Bacteria, UA: NONE SEEN
Bilirubin Urine: NEGATIVE
Glucose, UA: NEGATIVE mg/dL
Ketones, ur: NEGATIVE mg/dL
Leukocytes,Ua: NEGATIVE
Nitrite: NEGATIVE
Protein, ur: NEGATIVE mg/dL
Specific Gravity, Urine: 1.019 (ref 1.005–1.030)
pH: 6 (ref 5.0–8.0)

## 2019-10-20 LAB — TROPONIN I (HIGH SENSITIVITY)
Troponin I (High Sensitivity): 3 ng/L (ref <18)
Troponin I (High Sensitivity): 4 ng/L (ref <18)

## 2019-10-20 MED ORDER — CYCLOBENZAPRINE HCL 10 MG PO TABS: 10.0000 mg | ORAL_TABLET | Freq: Three times a day (TID) | ORAL | 0 refills | Status: DC | PRN

## 2019-10-20 MED FILL — CYCLOBENZAPRINE HCL 10 MG T: 10 | 10 days supply | Qty: 30 | Fill #0

## 2019-10-20 NOTE — Progress Notes (Addendum)
   PCP: Flossie Buffy, NP  Subjective:   HPI: Patient is a 59 y.o. female here for low back pain.  She reports that over the weekend, she developed pain in her low back.  Initially was on the left side, then wanted to the right side and now is on both sides.  She is not aware of any specific activities that caused her pain.  She reports that the pain is worse with flexion, and somewhat relieved by extension of the spine.  Rotation does not seem to bother it.  Is also worsened when she stands from a seated position.  She describes as a tight and spasming type pain.  It is constant but she has intermittent severe spasms that are very painful.  She has tried Aleve and icy hot pads which have not been very helpful.  She denies any numbness or tingling down her legs, leg weakness, saddle anesthesia, bowel or bladder incontinence.  She does not have any known history of back problems.  Review of Systems:  Per HPI.   Berthold, medications and smoking status reviewed.      Objective:  Physical Exam:  Gen: awake, alert, NAD, comfortable in exam room Pulm: breathing unlabored  Lumbar spine:  Inspection: No evidence of erythema, ecchymosis, swelling edema.  Palpation: No midline spinal tenderness. Nontender to facets.  She does have bilateral paraspinal muscle tenderness and increased muscle tension in the lumbar spine bilaterally ROM: Minimal forward flexion limited due to pain, intact to extension, rotation bilaterally. Special tests:  Neg straight leg raise, FABER, FADIR bilaterally    Assessment & Plan:  1.  Acute low back pain  Patient with acute low back pain bilaterally.  She does not have midline tenderness.  No red flag symptoms.  Differential includes lumbar strain, herniated disc, facet arthropathy.  Given that she is not having red flag symptoms, we will plan to manage conservatively for now with NSAIDs, Flexeril for the muscle spasms, and low back exercises.  Follow-up as needed if  pain is worsening or not improving over the next 1 to 2 weeks.  At that time would consider further imaging.   Dagoberto Ligas, MD [facility] Fellow 10/20/2019 1:39 PM   **Addendum: Shortly after the conclusion of this visit, patient became suddenly diaphoretic and nauseous while in the clinic. On reevaluation, she was severely diaphoretic, tachypneic, and vomited multiple times. She reports that this came on suddenly and she is not having symptoms like this before arrival. After discussion with patient, we decided to send her to the ED for urgent evaluation. Differential includes MI, nephrolithiasis, pyelonephritis. Given her significant medical history of HTN, HLD, I am concerned about possible cardiac etiology and will send to the ED.  Addendum:  Patient seen in the office by fellow.  His history, exam, plan of care were precepted with me.  Patient became diaphoretic with nausea, persistent vomiting.  No chest pain, shortness of breath, palpitations.  EMS called to transport to ED for further evaluation. Karlton Lemon MD Kirt Boys

## 2019-10-20 NOTE — Patient Instructions (Signed)
Thank you for coming in to see [place] today. You have been diagnosed with a low back strain. Please see below to review our plan for today's visit:   1. Please continue to take Aleve 2 times per day over the next week and then as needed for pain. 2.   You may also take Flexeril as needed for muscle spasms. 3.   Please return if you continue to have pain or if you develop urinary or bowel incontinence, numbness or tingling down your legs, or leg weakness.   Please call the clinic at [phone] if your symptoms worsen or you have any concerns. It was our pleasure to serve you.       Dr. Dagoberto Ligas [facility]

## 2019-10-20 NOTE — ED Triage Notes (Signed)
Patient arrives to ED via EMS with complaints of mid back pain that radiates to her right side that is constant. Pt states that today she became diaphoretic and had sudden onset of nausea and vomiting with SOB.

## 2019-10-21 ENCOUNTER — Ambulatory Visit: Payer: Managed Care, Other (non HMO) | Admitting: Sports Medicine

## 2019-10-21 ENCOUNTER — Emergency Department (HOSPITAL_COMMUNITY): Payer: Managed Care, Other (non HMO)

## 2019-10-21 MED ORDER — HYDROCODONE-ACETAMINOPHEN 5-325 MG PO TABS: 1.0000 | ORAL_TABLET | Freq: Four times a day (QID) | ORAL | 0 refills | Status: DC | PRN

## 2019-10-21 MED ORDER — ONDANSETRON HCL 4 MG/2ML IJ SOLN
4.0000 mg | Freq: Once | INTRAMUSCULAR | Status: AC
  Administered 2019-10-21: 4 mg via INTRAVENOUS
  Filled 2019-10-21: qty 2

## 2019-10-21 MED ORDER — NAPROXEN 500 MG PO TABS: 500.0000 mg | ORAL_TABLET | Freq: Two times a day (BID) | ORAL | 0 refills | Status: DC

## 2019-10-21 MED ORDER — KETOROLAC TROMETHAMINE 30 MG/ML IJ SOLN
30.0000 mg | Freq: Once | INTRAMUSCULAR | Status: AC
  Administered 2019-10-21: 30 mg via INTRAVENOUS
  Filled 2019-10-21: qty 1

## 2019-10-21 MED ORDER — MORPHINE SULFATE (PF) 4 MG/ML IV SOLN
4.0000 mg | Freq: Once | INTRAVENOUS | Status: AC
  Administered 2019-10-21: 4 mg via INTRAVENOUS
  Filled 2019-10-21: qty 1

## 2019-10-21 MED FILL — NAPROXEN 500 MG TABS: 500 | 7 days supply | Qty: 15 | Fill #0

## 2019-10-21 MED FILL — HYDROCODON-APAP 5-325: 5-325 | 2 days supply | Qty: 12 | Fill #0

## 2019-10-21 NOTE — Discharge Instructions (Addendum)
Begin taking naproxen as prescribed.  Take hydrocodone as prescribed as needed for pain not relieved with naproxen.  Follow-up with primary doctor if symptoms or not improving in the next 3 to 4 days, and return to the ER if symptoms significantly worsen or change.

## 2019-10-21 NOTE — ED Provider Notes (Signed)
[place] EMERGENCY DEPARTMENT Provider Note   CSN: [id number] Arrival date & time: 10/20/19  1528     History Chief Complaint  Patient presents with  . Back Pain  . Emesis    Jennifer [name] is a 59 y.o. female.  Patient is a 59 year old female with past medical history of atrial fibrillation, hypertension, obesity.  Jennifer [name] presents today for evaluation of right flank pain.  This began earlier this morning and has come and gone.  Jennifer [name] saw her doctor earlier today and was diagnosed with a muscle spasm.  Her pain returned this evening and presents for evaluation of it.  Patient describes pain to the right flank that radiates to the front.  Jennifer [name] denies any fevers or chills.  Jennifer [name] denies any bowel or bladder complaints.  Today her pain was associated with nausea and vomiting.  Jennifer [name] denies any aggravating or alleviating factors.    The history is provided by the patient.       Past Medical History:  Diagnosis Date  . Allergy    allergic rhinitis  . Anemia 06/2007   C-scope 06/2007: tics, neg hemocults 07/2007  . Anxiety   . Blood transfusion without reported diagnosis    at birth  . Chest pain 12/2007   negative ECHO, neg stress test 06/2008  . Condyloma acuminatum    history of  . Hyperglycemia    borderline. A1c 5.7  12/2007  . Hypertension   . Personal history of atrial fibrillation    on carvedilol only   . Sleep apnea    wears cpap at night    Patient Active Problem List   Diagnosis Date Noted  . Left knee pain 01/18/2019  . 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 Elevated LDL cholesterol level 04/21/2018  . OSA (obstructive sleep apnea) 03/13/2016  . Hyperglycemia 01/12/2016  . Atrial fibrillation ([place]) 11/16/2015  . Esophageal reflux 10/16/2012  . 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 Obesity 12/20/2011  . HOT FLASHES 04/27/2010  . CHOLECYSTECTOMY, LAPAROSCOPIC, HX OF 08/09/2009  . Allergic rhinitis 04/26/2008  . CONDYLOMA ACUMINATUM 04/15/2007    . Essential hypertension 07/03/2006    Past Surgical History:  Procedure Laterality Date  . ABDOMINAL HYSTERECTOMY     no oophorectomy  . BREAST EXCISIONAL BIOPSY    . BREAST SURGERY  age 54   Fibroid tumor removed right breast  . CARPAL TUNNEL RELEASE    . CESAREAN SECTION    . CHOLECYSTECTOMY    . COLONOSCOPY       OB History   No obstetric history on file.     Family History  Problem Relation Age of Onset  . Breast cancer Paternal Aunt   . Cervical cancer Cousin   . Diabetes Mother   . Coronary artery disease Father   . Kidney disease Father   . Diabetes Father   . Hypertension Father   . Prostate cancer Father   . Coronary artery disease Sister   . Kidney disease Sister   . Diabetes Sister   . Hypertension Sister   . Kidney disease Brother   . Stroke Brother   . Hypertension Brother        x3  . Diabetes Maternal Grandmother   . Stroke Maternal Grandmother   . Diabetes Paternal Grandmother   . Stroke Paternal Grandmother   . Coronary artery disease Other        grandmother, uncle  . Kidney disease Other  grandmother  . Stroke Other        uncle  . Thyroid disease Other        aunts  . Glaucoma Other        grandmother  . Hypertension Other        grandmother  . Diabetes Other        aunts  . Hypothyroidism Other        aunt  . Hyperthyroidism Other        aunt  . Healthy Child   . Healthy Child   . Healthy Child   . Colon cancer Neg Hx   . Colon polyps Neg Hx   . Esophageal cancer Neg Hx   . Rectal cancer Neg Hx   . Stomach cancer Neg Hx     Social History   Tobacco Use  . Smoking status: Former Smoker    Packs/day: 0.50    Years: 27.00    Pack years: 13.50    Types: Cigarettes    Quit date: 02/12/2004    Years since quitting: 15.6  . Smokeless tobacco: Never Used  Substance Use Topics  . Alcohol use: No  . Drug use: No    Home Medications Prior to Admission medications   Medication Sig Start Date End Date Taking?  Authorizing Provider  Albuterol Sulfate (PROAIR RESPICLICK) 128 (90 Base) MCG/ACT AEPB Inhale 1 puff into the lungs every 6 (six) hours as needed. 01/26/18   Nche, Charlene Brooke, NP  amLODipine (NORVASC) 10 MG tablet Take 1 tablet (10 mg total) by mouth at bedtime. Resume if BP>150/90 06/23/19   Nche, Charlene Brooke, NP  carvedilol (COREG) 25 MG tablet TAKE 1 TABLET BY MOUTH TWICE A DAY WITH MEALS 06/23/19   Nche, Charlene Brooke, NP  cyclobenzaprine (FLEXERIL) 10 MG tablet Take 1 tablet (10 mg total) by mouth 3 (three) times daily as needed for muscle spasms. 10/20/19   Dagoberto Ligas, MD  diclofenac (VOLTAREN) 75 MG EC tablet Take 1 tablet (75 mg total) by mouth 2 (two) times daily. 08/31/19   Kennyth Arnold, FNP  fluticasone (FLONASE) 50 MCG/ACT nasal spray PLACE 2 SPRAYS INTO BOTH NOSTRILS DAILY. 08/13/19   Nche, Charlene Brooke, NP  MAGNESIUM PO Take by mouth.    [provider]  Multiple Vitamin (MULTIVITAMIN WITH MINERALS) TABS tablet Take 1 tablet by mouth at bedtime. ALIVE multi-vitamin    [provider]  olmesartan (BENICAR) 20 MG tablet Take 0.5 tablets (10 mg total) by mouth every morning. 03/22/19   Nche, Charlene Brooke, NP  OVER THE COUNTER MEDICATION Herbal Tea daily    [provider]    Allergies    Ace inhibitors and Codeine  Review of Systems   Review of Systems  All other systems reviewed and are negative.   Physical Exam Updated Vital Signs BP (!) 161/91 (BP Location: Right Arm)   Pulse 84   Temp 97.6 F (36.4 C) (Temporal)   Resp 20   SpO2 99%   Physical Exam Vitals and nursing note reviewed.  Constitutional:      General: Jennifer [name] is not in acute distress.    Appearance: Jennifer [name] is well-developed. Jennifer [name] is not diaphoretic.  HENT:     Head: Normocephalic and atraumatic.  Cardiovascular:     Rate and Rhythm: Normal rate and regular rhythm.     Heart sounds: No murmur heard.  No friction rub. No gallop.   Pulmonary:     Effort: Pulmonary effort is  normal.  No respiratory distress.     Breath sounds: Normal breath sounds. No wheezing.  Abdominal:     General: Bowel sounds are normal. There is no distension.     Palpations: Abdomen is soft.     Tenderness: There is no abdominal tenderness. There is right CVA tenderness. There is no guarding or rebound.  Musculoskeletal:        General: Normal range of motion.     Cervical back: Normal range of motion and neck supple.  Skin:    General: Skin is warm and dry.  Neurological:     Mental Status: Jennifer [name] is alert and oriented to person, place, and time.     ED Results / Procedures / Treatments   Labs (all labs ordered are listed, but only abnormal results are displayed) Labs Reviewed  COMPREHENSIVE METABOLIC PANEL - Abnormal; Notable for the following components:      Result Value   Glucose, Bld 142 (*)    Albumin 3.4 (*)    All other components within normal limits  CBC - Abnormal; Notable for the following components:   RBC 3.66 (*)    Hemoglobin 10.4 (*)    HCT 33.9 (*)    All other components within normal limits  URINALYSIS, ROUTINE W REFLEX MICROSCOPIC - Abnormal; Notable for the following components:   APPearance HAZY (*)    Hgb urine dipstick SMALL (*)    All other components within normal limits  LIPASE, BLOOD  TROPONIN I (HIGH SENSITIVITY)  TROPONIN I (HIGH SENSITIVITY)    EKG None  Radiology No results found.  Procedures Procedures (including critical care time)  Medications Ordered in ED Medications  morphine 4 MG/ML injection 4 mg (has no administration in time range)  ketorolac (TORADOL) 30 MG/ML injection 30 mg (has no administration in time range)  ondansetron (ZOFRAN) injection 4 mg (has no administration in time range)    ED Course  I have reviewed the triage vital signs and the nursing notes.  Pertinent labs & imaging results that were available during my care of the patient were reviewed by me and considered in my medical decision making (see  chart for details).    MDM Rules/Calculators/A&P  Patient is a 59 year old female presenting with complaints of right flank pain.  This started abruptly yesterday morning and has occurred intermittently since.  Her presentation was suggestive of a renal calculus, however none were identified on renal CT.  There are no other causes of her discomfort identified on the CT scan.  I suspect a musculoskeletal etiology.  Patient was given medications here in the ER and is feeling significantly improved.  Jennifer [name] will be discharged with an anti-inflammatory, medication for pain, and is to follow-up with primary doctor if not improving.  Final Clinical Impression(s) / ED Diagnoses Final diagnoses:  None    Rx / DC Orders ED Discharge Orders    None       Veryl Speak, MD 10/21/19 [phone]

## 2019-10-25 ENCOUNTER — Telehealth (INDEPENDENT_AMBULATORY_CARE_PROVIDER_SITE_OTHER): Payer: Managed Care, Other (non HMO) | Admitting: Family Medicine

## 2019-10-25 ENCOUNTER — Encounter: Payer: Self-pay | Admitting: Family Medicine

## 2019-10-25 VITALS — Temp 97.0°F | Ht 62.0 in | Wt 248.0 lb

## 2019-10-25 DIAGNOSIS — D649 Anemia, unspecified: Secondary | ICD-10-CM | POA: Diagnosis not present

## 2019-10-25 DIAGNOSIS — R112 Nausea with vomiting, unspecified: Secondary | ICD-10-CM | POA: Diagnosis not present

## 2019-10-25 DIAGNOSIS — R1110 Vomiting, unspecified: Secondary | ICD-10-CM | POA: Insufficient documentation

## 2019-10-25 NOTE — Progress Notes (Signed)
Established Patient Office Visit  Subjective:  Patient ID: Jennifer [name], female    DOB: 03/25/1960  Age: 59 y.o. MRN: [id number]  CC:  Chief Complaint  Patient presents with  . Nausea    C/O nausea x 5 days, vomiting 3 episodes within 5 days breaking out in major sweats. Little pains in lower back that come and go.     HPI Jennifer [name] follow-up of an intermittent span of nausea and vomiting associated with diaphoresis.  There have been no accompanying chest pain shortness of breath or dyspnea on exertion.  This started sometime after she had been seen at the sports medicine clinic for muscle spasms and lower back pain on 9/8.  She had been prescribed Flexeril and Naprosyn.  She did not believe there was an association between above symptoms and these medications.  She eventually presented to the emergency room on the same day for above symptoms.  Troponins,.  CMP, lipase were essentially normal.  CBC showed a hemoglobin of 10.4.  She reports that she received IV fluids in the ambulance and during her emergency room.  She denies any history of hematuria or bleeding per rectum or in her stool.  She denies URI symptoms headache fevers or chills or diarrhea.  Nausea and vomiting and muscle spasms seem to be resolving.  Past Medical History:  Diagnosis Date  . Allergy    allergic rhinitis  . Anemia 06/2007   C-scope 06/2007: tics, neg hemocults 07/2007  . Anxiety   . Blood transfusion without reported diagnosis    at birth  . Chest pain 12/2007   negative ECHO, neg stress test 06/2008  . Condyloma acuminatum    history of  . Hyperglycemia    borderline. A1c 5.7  12/2007  . Hypertension   . Personal history of atrial fibrillation    on carvedilol only   . Sleep apnea    wears cpap at night    Past Surgical History:  Procedure Laterality Date  . ABDOMINAL HYSTERECTOMY     no oophorectomy  . BREAST EXCISIONAL BIOPSY    . BREAST SURGERY  age 64   Fibroid tumor removed right  breast  . CARPAL TUNNEL RELEASE    . CESAREAN SECTION    . CHOLECYSTECTOMY    . COLONOSCOPY      Family History  Problem Relation Age of Onset  . Breast cancer Paternal Aunt   . Cervical cancer Cousin   . Diabetes Mother   . Coronary artery disease Father   . Kidney disease Father   . Diabetes Father   . Hypertension Father   . Prostate cancer Father   . Coronary artery disease Sister   . Kidney disease Sister   . Diabetes Sister   . Hypertension Sister   . Kidney disease Brother   . Stroke Brother   . Hypertension Brother        x3  . Diabetes Maternal Grandmother   . Stroke Maternal Grandmother   . Diabetes Paternal Grandmother   . Stroke Paternal Grandmother   . Coronary artery disease Other        grandmother, uncle  . Kidney disease Other        grandmother  . Stroke Other        uncle  . Thyroid disease Other        aunts  . Glaucoma Other        grandmother  . Hypertension Other  grandmother  . Diabetes Other        aunts  . Hypothyroidism Other        aunt  . Hyperthyroidism Other        aunt  . Healthy Child   . Healthy Child   . Healthy Child   . Colon cancer Neg Hx   . Colon polyps Neg Hx   . Esophageal cancer Neg Hx   . Rectal cancer Neg Hx   . Stomach cancer Neg Hx     Social History   Socioeconomic History  . Marital status: Divorced    Spouse name: Not on file  . Number of children: 3  . Years of education: Not on file  . Highest education level: Not on file  Occupational History    Employer: [employer]  Tobacco Use  . Smoking status: Former Smoker    Packs/day: 0.50    Years: 27.00    Pack years: 13.50    Types: Cigarettes    Quit date: 02/12/2004    Years since quitting: 15.7  . Smokeless tobacco: Never Used  Substance and Sexual Activity  . Alcohol use: No  . Drug use: No  . Sexual activity: Not Currently    Birth control/protection: Post-menopausal, Surgical    Comment: s/p hysterectomy  Other Topics  Concern  . 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 Difficulty of Paying Living Expenses: Not on file  Food Insecurity:   . Worried About Charity fundraiser in the Last Year: Not on file  . Ran Out of Food in the Last Year: Not on file  Transportation Needs:   . Lack of Transportation (Medical): Not on file  . Lack of Transportation (Non-Medical): Not on file  Physical Activity:   . Days of Exercise per Week: Not on file  . Minutes of Exercise per Session: Not on file  Stress:   . Feeling of Stress : Not on file  Social Connections:   . Frequency of Communication with Friends and Family: Not on file  . Frequency of Social Gatherings with Friends and Family: Not on file  . Attends Religious Services: Not on file  . Active Member of Clubs or Organizations: Not on file  . Attends Archivist Meetings: Not on file  . Marital Status: Not on file  Intimate Partner Violence:   . Fear of Current or Ex-Partner: Not on file  . Emotionally Abused: Not on file  . Physically Abused: Not on file  . Sexually Abused: Not on file    Outpatient Medications Prior to Visit  Medication Sig Dispense Refill  . Albuterol Sulfate (PROAIR RESPICLICK) 505 (90 Base) MCG/ACT AEPB Inhale 1 puff into the lungs every 6 (six) hours as needed. 1 each 0  . amLODipine (NORVASC) 10 MG tablet Take 1 tablet (10 mg total) by mouth at bedtime. Resume if BP>150/90 30 tablet 5  . carvedilol (COREG) 25 MG tablet TAKE 1 TABLET BY MOUTH TWICE A DAY WITH MEALS 180 tablet 3  . fluticasone (FLONASE) 50 MCG/ACT nasal spray PLACE 2 SPRAYS INTO BOTH NOSTRILS DAILY. 16 g 5  .  HYDROcodone-acetaminophen (NORCO) 5-325 MG tablet Take  1-2 tablets by mouth every 6 (six) hours as needed. 12 tablet 0  . Multiple Vitamin (MULTIVITAMIN WITH MINERALS) TABS tablet Take 1 tablet by mouth at bedtime. ALIVE multi-vitamin    . naproxen (NAPROSYN) 500 MG tablet Take 1 tablet (500 mg total) by mouth 2 (two) times daily with a meal. 15 tablet 0  . olmesartan (BENICAR) 20 MG tablet Take 0.5 tablets (10 mg total) by mouth every morning. 45 tablet 1  . cyclobenzaprine (FLEXERIL) 10 MG tablet Take 1 tablet (10 mg total) by mouth 3 (three) times daily as needed for muscle spasms. (Patient not taking: Reported on 10/25/2019) 30 tablet 0  . diclofenac (VOLTAREN) 75 MG EC tablet Take 1 tablet (75 mg total) by mouth 2 (two) times daily. (Patient not taking: Reported on 10/25/2019) 30 tablet 0  . MAGNESIUM PO Take by mouth. (Patient not taking: Reported on 10/25/2019)    . OVER THE COUNTER MEDICATION Herbal Tea daily (Patient not taking: Reported on 10/25/2019)     No facility-administered medications prior to visit.    Allergies  Allergen Reactions  . Ace Inhibitors Cough  . Codeine Nausea And Vomiting    ROS Review of Systems  Constitutional: Positive for diaphoresis. Negative for chills, fatigue, fever and unexpected weight change.  HENT: Negative.   Eyes: Negative for photophobia.  Respiratory: Negative for cough, chest tightness and shortness of breath.   Cardiovascular: Negative for chest pain and palpitations.  Gastrointestinal: Positive for nausea and vomiting. Negative for abdominal pain, anal bleeding, blood in stool and diarrhea.  Endocrine: Negative for polyphagia and polyuria.  Genitourinary: Negative for hematuria.  Musculoskeletal: Positive for back pain and myalgias.  Allergic/Immunologic: Negative for immunocompromised state.  Neurological: Negative for weakness and headaches.  Hematological: Does not bruise/bleed easily.  Psychiatric/Behavioral: Negative.       Objective:    Physical Exam Vitals and nursing note  reviewed.  Constitutional:      General: She is not in acute distress. Neurological:     Mental Status: She is alert and oriented to person, place, and time.  Psychiatric:        Mood and Affect: Mood normal.        Behavior: Behavior normal.     Temp (!) 97 F (36.1 C) (Tympanic)   Ht 5\' 2"  (1.575 m)   Wt 248 lb (112.5 kg)   BMI 45.36 kg/m  Wt Readings from Last 3 Encounters:  10/25/19 248 lb (112.5 kg)  10/20/19 248 lb (112.5 kg)  10/05/19 249 lb (112.9 kg)     Health Maintenance Due  Topic Date Due  . INFLUENZA VACCINE  09/12/2019    There are no preventive care reminders to display for this patient.  Lab Results  Component Value Date   TSH 1.34 12/02/2018   Lab Results  Component Value Date   WBC 8.5 10/20/2019   HGB 10.4 (L) 10/20/2019   HCT 33.9 (L) 10/20/2019   MCV 92.6 10/20/2019   PLT 320 10/20/2019   Lab Results  Component Value Date   NA 139 10/20/2019   K 3.5 10/20/2019   CO2 22 10/20/2019   GLUCOSE 142 (H) 10/20/2019   BUN 16 10/20/2019   CREATININE 0.79 10/20/2019   BILITOT 0.3 10/20/2019   ALKPHOS 72 10/20/2019   AST 25 10/20/2019   ALT 23 10/20/2019   PROT 7.2 10/20/2019   ALBUMIN 3.4 (L) 10/20/2019   CALCIUM 8.9 10/20/2019   ANIONGAP 12  10/20/2019   GFR 99.89 03/22/2019   Lab Results  Component Value Date   CHOL 198 10/05/2019   Lab Results  Component Value Date   HDL 52.90 10/05/2019   Lab Results  Component Value Date   LDLCALC 134 (H) 10/05/2019   Lab Results  Component Value Date   TRIG 55.0 10/05/2019   Lab Results  Component Value Date   CHOLHDL 4 10/05/2019   Lab Results  Component Value Date   HGBA1C 5.8 10/05/2019      Assessment & Plan:   Problem List Items Addressed This Visit    None      No orders of the defined types were placed in this encounter.   Follow-up: No follow-ups on file.  Symptoms of nausea and vomiting with diaphoresis have resolved for the most part.  Sports medicine wanted  patient to be seen prior to returning for a knee injection.  Advised patient to give it another week and then follow-up with sports medicine.  Advised her to follow-up with her primary for recheck of her hemoglobin.  Libby Maw, MD   Virtual Visit via Telephone Note  I connected with Jennifer [name] on 53/66/44 at  4:00 PM EDT by telephone and verified that I am speaking with the correct person using two identifiers.  Location: Patient: home with family.  Provider:    I discussed the limitations, risks, security and privacy concerns of performing an evaluation and management service by telephone and the availability of in person appointments. I also discussed with the patient that there may be a patient responsible charge related to this service. The patient expressed understanding and agreed to proceed.   History of Present Illness:    Observations/Objective:   Assessment and Plan:   Follow Up Instructions:    I discussed the assessment and treatment plan with the patient. The patient was provided an opportunity to ask questions and all were answered. The patient agreed with the plan and demonstrated an understanding of the instructions.   The patient was advised to call back or seek an in-person evaluation if the symptoms worsen or if the condition fails to improve as anticipated.  I provided 25 minutes of non-face-to-face time during this encounter.   Libby Maw, MD

## 2019-11-02 ENCOUNTER — Ambulatory Visit
Admission: RE | Admit: 2019-11-02 | Discharge: 2019-11-02 | Disposition: A | Discharge status: Home / Self Care | Payer: Managed Care, Other (non HMO) | Source: Ambulatory Visit | Attending: Sports Medicine | Admitting: Sports Medicine

## 2019-11-02 ENCOUNTER — Ambulatory Visit (INDEPENDENT_AMBULATORY_CARE_PROVIDER_SITE_OTHER): Payer: Managed Care, Other (non HMO) | Admitting: Sports Medicine

## 2019-11-02 ENCOUNTER — Other Ambulatory Visit: Payer: Self-pay

## 2019-11-02 VITALS — BP 151/79 | Ht 62.0 in | Wt 242.0 lb

## 2019-11-02 DIAGNOSIS — M25562 Pain in left knee: Secondary | ICD-10-CM | POA: Diagnosis not present

## 2019-11-02 DIAGNOSIS — M545 Low back pain, unspecified: Secondary | ICD-10-CM

## 2019-11-02 DIAGNOSIS — G8929 Other chronic pain: Secondary | ICD-10-CM

## 2019-11-02 MED ORDER — METHYLPREDNISOLONE ACETATE 40 MG/ML IJ SUSP
40.0000 mg | Freq: Once | INTRAMUSCULAR | Status: AC
  Administered 2019-11-02: 40 mg via INTRA_ARTICULAR

## 2019-11-03 ENCOUNTER — Telehealth: Payer: Self-pay

## 2019-11-03 NOTE — Progress Notes (Signed)
   Subjective:    Patient ID: Jennifer [name], female    DOB: [birth year], 59 y.o.   MRN: [id number]  HPI   Patient comes in today for a repeat left knee cortisone injection.  An MRI done recently showed a medial meniscal tear in the setting of osteoarthritis.  I recommended that we try another cortisone injection before referring for surgery.  She is amendable to that plan. She is also complaining of low back pain.  Pain is diffuse across her low back.  Occasionally will radiate into the posterior aspect of her upper legs.  Symptoms are much more noticeable with standing and walking.  Improves with sitting.  No numbness or tingling down the legs.  No known trauma.  She was recently seen in the emergency room for this pain and a CT scan of her abdomen showed no evidence of renal calculi.  She was placed on hydrocodone and naproxen and these have been helpful.    Review of Systems    Above Objective:   Physical Exam  Well-developed, well-nourished.  No acute distress  Lumbar spine: Patient has limited lumbar range of motion secondary to pain.  Diffuse tenderness to palpation across the lower lumbar spine.  There are no focal neurological deficits of either lower extremity grossly.  Left knee: Range of motion is 0 to 120 degrees.  Trace effusion.  No significant tenderness to palpation good stability.  Neurovascular intact distally.  X-rays of her lumbar spine including AP and lateral views show multilevel facet arthropathy.  Nothing acute      Assessment & Plan:   Left knee pain secondary to DJD and medial meniscal tear Low back pain likely secondary to facet arthropathy  Left knee is reinjected today with cortisone.  This is accomplished atraumatically under sterile technique utilizing an anterior lateral approach.  Patient tolerates this without difficulty.  She will return to the office in 2 weeks for reevaluation of both the left knee and the low back.  If left knee pain persist,  consider surgical referral.  If low back pain persists, consider MRI in anticipation of ordering facet injections.  In the meantime she will continue with her hydrocodone and naproxen as needed.  Consent obtained and verified. Time-out conducted. Noted no overlying erythema, induration, or other signs of local infection. Skin prepped in a sterile fashion. Topical analgesic spray: Ethyl chloride. Joint: left knee Needle: 25g 1.5 inch Completed without difficulty. Meds: 3cc 1% xylocaine, 1cc (40mg ) depomedrol  Advised to call if fevers/chills, erythema, induration, drainage, or persistent bleeding.

## 2019-11-03 NOTE — Telephone Encounter (Signed)
Pt instructed to call back with questions.

## 2019-11-08 MED FILL — CARVEDILOL 25 MG TABLET: 25 | 30 days supply | Qty: 60 | Fill #3

## 2019-11-16 ENCOUNTER — Ambulatory Visit (INDEPENDENT_AMBULATORY_CARE_PROVIDER_SITE_OTHER): Payer: Managed Care, Other (non HMO) | Admitting: Sports Medicine

## 2019-11-16 ENCOUNTER — Other Ambulatory Visit: Payer: Self-pay

## 2019-11-16 VITALS — BP 159/90 | Ht 62.0 in | Wt 237.0 lb

## 2019-11-16 DIAGNOSIS — M25562 Pain in left knee: Secondary | ICD-10-CM

## 2019-11-16 DIAGNOSIS — G8929 Other chronic pain: Secondary | ICD-10-CM

## 2019-11-16 DIAGNOSIS — M545 Low back pain, unspecified: Secondary | ICD-10-CM

## 2019-11-17 NOTE — Progress Notes (Signed)
Patient ID: Jennifer [name], female   DOB: Jun 07, 1960, 59 y.o.   MRN: [id number]  Patient comes in today for follow-up on left knee pain and low back pain.  Left knee pain has improved with a recent cortisone injection.  Unfortunately, she continues to struggle with low back pain.  Pain is most noticeable with standing and walking.  Improves with sitting.  It is diffuse across her low back and occasionally will radiate down the lateral and posterior aspect of the upper leg.  No radiating pain past the knee.  No associated numbness or tingling.  X-rays of her lumbar spine show multilevel facet arthropathy but no significant disc space narrowing.  Nothing acute.  At this point I recommend formal physical therapy for her low back pain.  I would also like to restrict her at work to sitting duty only.  She will follow up with me again in 4 weeks.  If symptoms persist, consider merits of MRI in anticipation of facet injections.  Call with questions or concerns in the interim.

## 2019-12-09 ENCOUNTER — Other Ambulatory Visit: Payer: Self-pay

## 2019-12-09 ENCOUNTER — Ambulatory Visit: Payer: Managed Care, Other (non HMO) | Attending: Sports Medicine

## 2019-12-09 DIAGNOSIS — G8929 Other chronic pain: Secondary | ICD-10-CM | POA: Insufficient documentation

## 2019-12-09 DIAGNOSIS — R262 Difficulty in walking, not elsewhere classified: Secondary | ICD-10-CM | POA: Insufficient documentation

## 2019-12-09 DIAGNOSIS — M545 Low back pain, unspecified: Secondary | ICD-10-CM | POA: Diagnosis present

## 2019-12-09 DIAGNOSIS — M6281 Muscle weakness (generalized): Secondary | ICD-10-CM | POA: Diagnosis present

## 2019-12-09 DIAGNOSIS — M62830 Muscle spasm of back: Secondary | ICD-10-CM | POA: Insufficient documentation

## 2019-12-09 NOTE — Therapy (Signed)
[place], [place], [address] Phone: [phone]   Fax:  [phone]  Physical Therapy Evaluation  Patient Details  Name: Jennifer [name] MRN: [id number] Date of Birth: 1960-11-04 Referring Provider (PT): Thurman Coyer, DO   Encounter Date: 12/09/2019   PT End of Session - 12/09/19 1740    Visit Number 1    Number of Visits 13    Date for PT Re-Evaluation 01/22/20    Authorization Type [employer] - Vaso not covered - FOTO at visit 6 and visit 10    PT Start Time 1742    PT Stop Time 1827    PT Time Calculation (min) 45 min    Activity Tolerance Patient tolerated treatment well    Behavior During Therapy [facility] for tasks assessed/performed           Past Medical History:  Diagnosis Date  . Allergy    allergic rhinitis  . Anemia 06/2007   C-scope 06/2007: tics, neg hemocults 07/2007  . Anxiety   . Blood transfusion without reported diagnosis    at birth  . Chest pain 12/2007   negative ECHO, neg stress test 06/2008  . Condyloma acuminatum    history of  . Hyperglycemia    borderline. A1c 5.7  12/2007  . Hypertension   . Personal history of atrial fibrillation    on carvedilol only   . Sleep apnea    wears cpap at night    Past Surgical History:  Procedure Laterality Date  . ABDOMINAL HYSTERECTOMY     no oophorectomy  . BREAST EXCISIONAL BIOPSY    . BREAST SURGERY  age 50   Fibroid tumor removed right breast  . CARPAL TUNNEL RELEASE    . CESAREAN SECTION    . CHOLECYSTECTOMY    . COLONOSCOPY      There were no vitals filed for this visit.    Subjective Assessment - 12/09/19 1745    Subjective "Right now I feel nothing. I guess it started in my left knee and moved up to my lower back. About a month ago, I started having muscle spasms on the right side of my middle and lower back. At one point last October it got to a point where I was having pain across the front (pointing to the  groin), and I could not walk for 4 days. I went to the emergency room then, did a lot of icing, and rested before I could walk again. That happened again this past April and the 1st of September this year where I could not walk for 4 days again. Right after then, I started to have the muscle spasms." Pt reports changing her diet and noticing decreased pain.    Limitations Standing;Walking;House hold activities    How long can you sit comfortably? No complaints with prolonged sitting itself but would have trouble standing/walking after prolonged sitting    How long can you stand comfortably? Less than 30 minutes before needing to sit down    How long can you walk comfortably? Less than 30 minutes before needing to sit down - used to walk 3-4x/week ~30-45 minutes until July 2021 but cannot tolerate it now    Patient Stated Goals walking with her cousins    Currently in Pain? No/denies   no pain in back or L knee; some tightness in B thighs   Aggravating Factors  standing, walking, sleeping on R side    Pain Relieving Factors  sitting, lying in supine with BLE elevated    Effect of Pain on Daily Activities difficulty with prolonged standing and walking              [facility] PT Assessment - 12/09/19 0001      Assessment   Medical Diagnosis Acute bilateral low back pain without sciatica M54.50    Referring Provider (PT) Thurman Coyer, DO    Onset Date/Surgical Date --   "October 2020 is when everything started"   Hand Dominance Right    Next MD Visit 12/21/2019    Prior Therapy No      Precautions   Precautions None      Restrictions   Weight Bearing Restrictions No      Balance Screen   Has the patient fallen in the past 6 months No    Has the patient had a decrease in activity level because of a fear of falling?  Yes    Is the patient reluctant to leave their home because of a fear of falling?  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      ROM / Strength   AROM / PROM / Strength AROM;PROM;Strength      AROM   AROM Assessment Site Lumbar    Lumbar Flexion 1 inch above toes with tennis shoes on    Lumbar Extension WFL    Lumbar - Right Side Bend 1 inch past R knee joint but minimal pain    Lumbar - Left Side Bend 1 inch past L knee joint line    Lumbar - Right Rotation WFL but minimal pain    Lumbar - Left Rotation [facility]      Strength   Strength Assessment Site Hip;Knee;Ankle    Right/Left Hip Right;Left    Right Hip Flexion 5/5    Left Hip Flexion 5/5    Right/Left Knee Right;Left    Right Knee Flexion 5/5    Right Knee Extension 5/5    Left Knee Flexion 5/5    Left Knee Extension 5/5    Right/Left Ankle Right;Left    Right Ankle Dorsiflexion 5/5    Right Ankle Plantar Flexion 5/5   modified test in sitting   Left Ankle Dorsiflexion 5/5  Left Ankle Plantar Flexion 5/5    modified test in sitting     Palpation   Palpation comment TTP across low back at level of ~L4-5 with some above/below but very minimal at this time. Pt points along attachment of quadratus lumborum when describing where it feels "tight" occasionally.      High Level Balance   High Level Balance Comments No significant deficits noted during standing with narrow BOS.                      Objective measurements completed on examination: See above findings.       [facility] Adult PT Treatment/Exercise - 12/09/19 0001      Self-Care   Self-Care Other Self-Care Comments      Exercises   Exercises Lumbar      Lumbar Exercises: Seated   Other Seated Lumbar Exercises Seated lumbar flexion (forward and to each side) with green physioball 5 x 3 sec hold in each direction                  PT Education - 12/09/19 1959    Education Details Patient education and demonstration using model for lumbar facet arthropathy (anatomy of condition) and how flexion exercises will be beneficial (especially with pt having relief in sitting and aggravation in extended positions such as walking/standing). HEP, FOTO score and predicted score.    Person(s) Educated Patient    Methods Explanation;Demonstration;Tactile cues;Verbal cues;Other (comment)   spine model   Comprehension Verbalized understanding;Returned demonstration            PT Short Term Goals - 12/09/19 2010      PT SHORT TERM GOAL #1   Title Patient will be independent with initial HEP.    Baseline Pt received initial HEP at evaluation 12/09/2019.    Time 3    Period Weeks    Status New    Target Date 12/30/19      PT SHORT TERM GOAL #2   Title Pt will be able to tolerate standing/walking on level ground for > 30 minutes without significant complaints of pain or having to take seated rest break.    Baseline Pt has to take seated rest break after less than 30 min of standing or walking    Time 3    Period Weeks     Status New    Target Date 12/30/19      PT SHORT TERM GOAL #3   Title Pt will be able to assume R sidelying position to sleep for at least 1 night without having to reposition to supine due to pain.    Baseline Pt currently sleeps in supine with BLE elevated due to pain when in right sidelying.    Time 3    Period Weeks    Status New    Target Date 12/30/19      PT SHORT TERM GOAL #4   Title Pt will improve FOTO score to 45% (55% limitation) from 40% at evaluation.    Baseline 60% limited; predicted 43% limited    Time 3    Period Weeks    Status New    Target Date 12/30/19             PT Long Term Goals - 12/09/19 2013      PT LONG TERM GOAL #1   Title Pt will be independent with advanced HEP.    Baseline Pt received initial HEP at evaluation 12/09/2019.  Time 6    Period Weeks    Status New    Target Date 01/20/20      PT LONG TERM GOAL #2   Title Pt will be able to tolerate standing/walking on level ground for at least 1 hour without significant complaints of pain or having to take seated rest break.    Baseline Pt has to take seated rest break after less than 30 min of standing or walking    Time 6    Period Weeks    Status New    Target Date 01/20/20      PT LONG TERM GOAL #3   Title Pt will be able to assume R sidelying position to sleep for at least 4/7 nights without having to reposition to supine due to pain.    Baseline Pt currently sleeps in supine with BLE elevated due to pain when in right sidelying.    Time 6    Period Weeks    Status New    Target Date 01/20/20      PT LONG TERM GOAL #4   Title Pt will improve FOTO score to 57% (43% limitation) from 40% at evaluation.    Baseline 60% limited; predicted 43% limited    Time 6    Period Weeks    Status New    Target Date 01/20/20                  Plan - 12/09/19 1832    Clinical Impression Statement Patient is a 59 year old female who presents to OPPT with complaints of previous  bilateral low back pain without sciatica that is not currently present. While completing FOTO, patient commented that bed mobility is very difficult when back pain is present, so she can benefit on education of proper body mechanics to limit stress on lumbar spine (log roll prior to supine > sit). Referral for the patient and previous imaging reveal lumbar facet arthropathy, and referral notes to focus on William's flexion exercises and to avoid lumbar extension. Patient experiences minimal pain with R lumbar sidebending and R trunk rotation but not at rest today. Patient has aggravation of symptoms with prolonged standing, walking, and sidelying and ease while sitting. Although pt is not currently experiencing her back pain, she will benefit from skilled PT intervention to maintain pain relief and improve functional mobility. Pt will also benefit from PT to help her return to walking regularly without limitations.    Personal Factors and Comorbidities Comorbidity 1;Comorbidity 2;Comorbidity 3+;Fitness;Time since onset of injury/illness/exacerbation    Comorbidities anxiety, HTN, hyperglycemia, afib    Examination-Activity Limitations Carry;Locomotion Level;Squat;Stairs;Stand;Bed Mobility    Examination-Participation Restrictions Community Activity;Shop;Cleaning    Stability/Clinical Decision Making Stable/Uncomplicated    Clinical Decision Making Low    Rehab Potential Good    PT Frequency 2x / week    PT Duration 6 weeks    PT Treatment/Interventions ADLs/Self Care Home Management;Aquatic Therapy;Cryotherapy;Electrical Stimulation;Iontophoresis 4mg /ml Dexamethasone;Moist Heat;Traction;Gait training;Stair training;Functional mobility training;Therapeutic activities;Therapeutic exercise;Balance training;Neuromuscular re-education;Patient/family education;Manual techniques;Dry needling;Passive range of motion;Taping    PT Next Visit Plan Review HEP, begin William's flexion exercises and core strengthening     PT Home Exercise Plan [id number] - lumbar flexion (demonstrated and instructed pt to perform in sitting)    Consulted and Agree with Plan of Care Patient           Patient will benefit from skilled therapeutic intervention in order to improve the following deficits and impairments:  Decreased activity tolerance,  Decreased endurance, Decreased mobility, Decreased strength, Decreased range of motion, Difficulty walking, Increased muscle spasms, Pain, Improper body mechanics  Visit Diagnosis: Chronic bilateral low back pain without sciatica - Plan: PT plan of care cert/re-cert  Muscle spasm of back - Plan: PT plan of care cert/re-cert  Difficulty in walking, not elsewhere classified - Plan: PT plan of care cert/re-cert  Muscle weakness (generalized) - Plan: PT plan of care cert/re-cert     Problem List Patient Active Problem List   Diagnosis Date Noted  . Non-intractable vomiting 10/25/2019  . Left knee pain 01/18/2019  . 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 Elevated LDL cholesterol level 04/21/2018  . OSA (obstructive sleep apnea) 03/13/2016  . Hyperglycemia 01/12/2016  . Atrial fibrillation ([place]) 11/16/2015  . Esophageal reflux 10/16/2012  . 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 Obesity 12/20/2011  . HOT FLASHES 04/27/2010  . CHOLECYSTECTOMY, LAPAROSCOPIC, HX OF 08/09/2009  . Allergic rhinitis 04/26/2008  . Anemia 05/05/2007  . CONDYLOMA ACUMINATUM 04/15/2007  . Essential hypertension 07/03/2006     Jennifer [name], PT, DPT 12/09/19 8:23 PM  [place] [facility] [address] [place], [place], [address] Phone: [phone]   Fax:  [phone]  Name: Jennifer [name] MRN: [id number] Date of Birth: 10-26-1960

## 2019-12-16 MED FILL — FLUTICASONE PROP 50 MCG SPR: 50 | 30 days supply | Qty: 16 | Fill #0

## 2019-12-16 MED FILL — OLMESARTAN MEDOXOMIL 20 MG: 20 | 30 days supply | Qty: 15 | Fill #3

## 2019-12-16 MED FILL — CARVEDILOL 25 MG TABLET: 25 | 30 days supply | Qty: 60 | Fill #4

## 2019-12-21 ENCOUNTER — Ambulatory Visit: Payer: Managed Care, Other (non HMO) | Attending: Sports Medicine

## 2019-12-21 ENCOUNTER — Other Ambulatory Visit: Payer: Self-pay

## 2019-12-21 ENCOUNTER — Ambulatory Visit (INDEPENDENT_AMBULATORY_CARE_PROVIDER_SITE_OTHER): Payer: Managed Care, Other (non HMO) | Admitting: Sports Medicine

## 2019-12-21 VITALS — BP 168/73 | Ht 62.0 in | Wt 237.0 lb

## 2019-12-21 DIAGNOSIS — M6281 Muscle weakness (generalized): Secondary | ICD-10-CM | POA: Diagnosis present

## 2019-12-21 DIAGNOSIS — M62830 Muscle spasm of back: Secondary | ICD-10-CM | POA: Insufficient documentation

## 2019-12-21 DIAGNOSIS — G8929 Other chronic pain: Secondary | ICD-10-CM | POA: Insufficient documentation

## 2019-12-21 DIAGNOSIS — M545 Low back pain, unspecified: Secondary | ICD-10-CM | POA: Diagnosis not present

## 2019-12-21 DIAGNOSIS — R262 Difficulty in walking, not elsewhere classified: Secondary | ICD-10-CM | POA: Diagnosis present

## 2019-12-21 DIAGNOSIS — M47816 Spondylosis without myelopathy or radiculopathy, lumbar region: Secondary | ICD-10-CM

## 2019-12-22 NOTE — Therapy (Signed)
[place], [place], [address] Phone: [phone]   Fax:  [phone]  Physical Therapy Treatment  Patient Details  Name: Jennifer [name] MRN: [id number] Date of Birth: November 09, 1960 Referring Provider (PT): Thurman Coyer, DO   Encounter Date: 12/21/2019   PT End of Session - 12/22/19 0707    Visit Number 2    Number of Visits 13    Date for PT Re-Evaluation 01/22/20    Authorization Type [employer] - Vaso not covered - FOTO at visit 6 and visit 10    PT Start Time 1751    PT Stop Time 1835    PT Time Calculation (min) 44 min    Activity Tolerance Patient tolerated treatment well    Behavior During Therapy [facility] for tasks assessed/performed           Past Medical History:  Diagnosis Date  . Allergy    allergic rhinitis  . Anemia 06/2007   C-scope 06/2007: tics, neg hemocults 07/2007  . Anxiety   . Blood transfusion without reported diagnosis    at birth  . Chest pain 12/2007   negative ECHO, neg stress test 06/2008  . Condyloma acuminatum    history of  . Hyperglycemia    borderline. A1c 5.7  12/2007  . Hypertension   . Personal history of atrial fibrillation    on carvedilol only   . Sleep apnea    wears cpap at night    Past Surgical History:  Procedure Laterality Date  . ABDOMINAL HYSTERECTOMY     no oophorectomy  . BREAST EXCISIONAL BIOPSY    . BREAST SURGERY  age 21   Fibroid tumor removed right breast  . CARPAL TUNNEL RELEASE    . CESAREAN SECTION    . CHOLECYSTECTOMY    . COLONOSCOPY      There were no vitals filed for this visit.   Subjective Assessment - 12/22/19 0709    Subjective Pt reports her low back has been duing well with only intermittent, low levels of discomfort. Ptnotes discomfort with standing after sitting extended periods. Pt's job involves prolonged periods of sitting.    Patient Stated Goals walking with her cousins    Currently in Pain? No/denies    Pain  Score 2     Pain Location Back    Pain Orientation Posterior;Lower    Pain Descriptors / Indicators Aching;Discomfort    Pain Type Chronic pain    Pain Radiating Towards NA    Pain Onset 1 to 4 weeks ago    Pain Frequency Intermittent    Aggravating Factors  Standing after prolinged sitting    Pain Relieving Factors HEP    Multiple Pain Sites No                             [facility] Adult PT Treatment/Exercise - 12/22/19 0001      Lumbar Exercises: Stretches   Hip Flexor Stretch Right;Left;2 reps;10 seconds    Hip Flexor Stretch Limitations re engagementprior to stretch. Attempted Thomas stretch which was uncomfortable for pt.    Other Lumbar Stretch Exercise Childs pose c green theraball, forward and laterally, 5x, 10 sec      Lumbar Exercises: Supine   Pelvic Tilt 10 reps    Pelvic Tilt Limitations 3 sec    Other Supine Lumbar Exercises Mini crunch c reach toward ankles  PT Education - 12/22/19 0703    Education Details Progression of HEP for low back stretching and abs. strengthening. Proper technique for in/out of be and low rolling in bed for mobility. Recommended a step stool c pt having a high bed. Recommended seated forward trunk stretch and PPTs prior to standing after sitting an extended period to minimize low back discomfort with standing.    Person(s) Educated Patient    Methods Explanation;Tactile cues;Demonstration;Verbal cues    Comprehension Verbalized understanding;Returned demonstration;Verbal cues required;Tactile cues required            PT Short Term Goals - 12/09/19 2010      PT SHORT TERM GOAL #1   Title Patient will be independent with initial HEP.    Baseline Pt received initial HEP at evaluation 12/09/2019.    Time 3    Period Weeks    Status New    Target Date 12/30/19      PT SHORT TERM GOAL #2   Title Pt will be able to tolerate standing/walking on level ground for > 30 minutes without significant  complaints of pain or having to take seated rest break.    Baseline Pt has to take seated rest break after less than 30 min of standing or walking    Time 3    Period Weeks    Status New    Target Date 12/30/19      PT SHORT TERM GOAL #3   Title Pt will be able to assume R sidelying position to sleep for at least 1 night without having to reposition to supine due to pain.    Baseline Pt currently sleeps in supine with BLE elevated due to pain when in right sidelying.    Time 3    Period Weeks    Status New    Target Date 12/30/19      PT SHORT TERM GOAL #4   Title Pt will improve FOTO score to 45% (55% limitation) from 40% at evaluation.    Baseline 60% limited; predicted 43% limited    Time 3    Period Weeks    Status New    Target Date 12/30/19             PT Long Term Goals - 12/09/19 2013      PT LONG TERM GOAL #1   Title Pt will be independent with advanced HEP.    Baseline Pt received initial HEP at evaluation 12/09/2019.    Time 6    Period Weeks    Status New    Target Date 01/20/20      PT LONG TERM GOAL #2   Title Pt will be able to tolerate standing/walking on level ground for at least 1 hour without significant complaints of pain or having to take seated rest break.    Baseline Pt has to take seated rest break after less than 30 min of standing or walking    Time 6    Period Weeks    Status New    Target Date 01/20/20      PT LONG TERM GOAL #3   Title Pt will be able to assume R sidelying position to sleep for at least 4/7 nights without having to reposition to supine due to pain.    Baseline Pt currently sleeps in supine with BLE elevated due to pain when in right sidelying.    Time 6    Period Weeks    Status New  Target Date 01/20/20      PT LONG TERM GOAL #4   Title Pt will improve FOTO score to 57% (43% limitation) from 40% at evaluation.    Baseline 60% limited; predicted 43% limited    Time 6    Period Weeks    Status New    Target Date  01/20/20                 Plan - 12/22/19 0714    Clinical Impression Statement Pt is doing well with only intermittent low level of low back pain. HEP/ther ex was progressed to address hip flexor tightness and abdominal strength. Instructed pt in proper in/out of bed and log rolling in bed with pt returning demonstration.    Personal Factors and Comorbidities Comorbidity 1;Comorbidity 2;Comorbidity 3+;Fitness;Time since onset of injury/illness/exacerbation    Comorbidities anxiety, HTN, hyperglycemia, afib    Examination-Activity Limitations Carry;Locomotion Level;Squat;Stairs;Stand;Bed Mobility    Examination-Participation Restrictions Community Activity;Shop;Cleaning    Stability/Clinical Decision Making Stable/Uncomplicated    Clinical Decision Making Low    Rehab Potential Good    PT Frequency 2x / week    PT Duration 6 weeks    PT Treatment/Interventions ADLs/Self Care Home Management;Aquatic Therapy;Cryotherapy;Electrical Stimulation;Iontophoresis 4mg /ml Dexamethasone;Moist Heat;Traction;Gait training;Stair training;Functional mobility training;Therapeutic activities;Therapeutic exercise;Balance training;Neuromuscular re-education;Patient/family education;Manual techniques;Dry needling;Passive range of motion;Taping    PT Next Visit Plan Review HEP and bed mobility. Add back strengthening exs to HEP as indicated.    PT Home Exercise Plan [id number] - lumbar flexion (demonstrated and instructed pt to perform in sitting)    Consulted and Agree with Plan of Care Patient           Patient will benefit from skilled therapeutic intervention in order to improve the following deficits and impairments:  Decreased activity tolerance, Decreased endurance, Decreased mobility, Decreased strength, Decreased range of motion, Difficulty walking, Increased muscle spasms, Pain, Improper body mechanics  Visit Diagnosis: Chronic bilateral low back pain without sciatica  Muscle spasm of  back  Difficulty in walking, not elsewhere classified  Muscle weakness (generalized)     Problem List Patient Active Problem List   Diagnosis Date Noted  . Non-intractable vomiting 10/25/2019  . Left knee pain 01/18/2019  . 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 Elevated LDL cholesterol level 04/21/2018  . OSA (obstructive sleep apnea) 03/13/2016  . Hyperglycemia 01/12/2016  . Atrial fibrillation ([place]) 11/16/2015  . Esophageal reflux 10/16/2012  . 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 Obesity 12/20/2011  . HOT FLASHES 04/27/2010  . CHOLECYSTECTOMY, LAPAROSCOPIC, HX OF 08/09/2009  . Allergic rhinitis 04/26/2008  . Anemia 05/05/2007  . CONDYLOMA ACUMINATUM 04/15/2007  . Essential hypertension 07/03/2006   Gar Ponto MS, PT 12/22/19 7:21 AM  [facility] [address] [place], [place], [address] Phone: [phone]   Fax:  [phone]  Name: Jennifer [name] MRN: [id number] Date of Birth: 04-23-1960

## 2019-12-22 NOTE — Progress Notes (Signed)
Patient ID: Jennifer [name], female   DOB: 03/16/1960, 59 y.o.   MRN: [id number]  Patient comes in today for follow-up on low back pain.  She has had one physical therapy visit.  She is slowly improving.  She has recently started truck driving school and notices most of her discomfort is during the precheck portion first thing in the morning when it is cold.  Otherwise, she seems to be doing well.  Physical exam was not repeated.  We simply talked about her ongoing back pain.  Previous x-rays demonstrated multilevel facet arthropathy and I explained to the patient that this is basically arthritis in her lumbar spine.  I recommended that she try 400 mg of Motrin 1 hour prior to her precheck to see if that will make a difference.  I have also encouraged her to continue with physical therapy as long as it is beneficial.  If she continues to improve, there will be no need for advanced imaging.  However, if her pain worsens then I would reconsider an MRI in anticipation of referral for facet injections.  She will follow up with me as needed.

## 2019-12-23 ENCOUNTER — Encounter: Payer: Managed Care, Other (non HMO) | Admitting: Physical Therapy

## 2019-12-28 ENCOUNTER — Other Ambulatory Visit: Payer: Self-pay

## 2019-12-28 ENCOUNTER — Ambulatory Visit: Payer: Managed Care, Other (non HMO) | Admitting: Physical Therapy

## 2019-12-28 ENCOUNTER — Encounter: Payer: Self-pay | Admitting: Physical Therapy

## 2019-12-28 DIAGNOSIS — M545 Low back pain, unspecified: Secondary | ICD-10-CM | POA: Diagnosis not present

## 2019-12-28 DIAGNOSIS — M6281 Muscle weakness (generalized): Secondary | ICD-10-CM

## 2019-12-28 DIAGNOSIS — M62830 Muscle spasm of back: Secondary | ICD-10-CM

## 2019-12-28 DIAGNOSIS — R262 Difficulty in walking, not elsewhere classified: Secondary | ICD-10-CM

## 2019-12-28 DIAGNOSIS — G8929 Other chronic pain: Secondary | ICD-10-CM

## 2019-12-28 NOTE — Therapy (Signed)
[place], [place], [address] Phone: [phone]   Fax:  [phone]  Physical Therapy Treatment  Patient Details  Name: Jennifer [name] MRN: [id number] Date of Birth: March 26, 1960 Referring Provider (PT): Thurman Coyer, DO   Encounter Date: 12/28/2019   PT End of Session - 12/28/19 1719    Visit Number 3    Number of Visits 13    Date for PT Re-Evaluation 01/22/20    Authorization Type [employer] - Vaso not covered - FOTO at visit 6 and visit 10    PT Start Time 1714    PT Stop Time 1754    PT Time Calculation (min) 40 min    Activity Tolerance Patient tolerated treatment well    Behavior During Therapy [facility] for tasks assessed/performed           Past Medical History:  Diagnosis Date   Allergy    allergic rhinitis   Anemia 06/2007   C-scope 06/2007: tics, neg hemocults 07/2007   Anxiety    Blood transfusion without reported diagnosis    at birth   Chest pain 12/2007   negative ECHO, neg stress test 06/2008   Condyloma acuminatum    history of   Hyperglycemia    borderline. A1c 5.7  12/2007   Hypertension    Personal history of atrial fibrillation    on carvedilol only    Sleep apnea    wears cpap at night    Past Surgical History:  Procedure Laterality Date   ABDOMINAL HYSTERECTOMY     no oophorectomy   BREAST EXCISIONAL BIOPSY     BREAST SURGERY  age 25   Fibroid tumor removed right breast   CARPAL TUNNEL RELEASE     CESAREAN SECTION     CHOLECYSTECTOMY     COLONOSCOPY      There were no vitals filed for this visit.   Subjective Assessment - 12/28/19 1717    Subjective It is pinching a little today. I think it is because I ate rice and I had given it up.    Currently in Pain? Yes    Pain Score 3     Pain Location Back    Pain Orientation Lower;Medial    Pain Descriptors / Indicators --   pinching   Pain Frequency Intermittent    Aggravating Factors  cold  outside    Pain Relieving Factors sitting                             [facility] Adult PT Treatment/Exercise - 12/28/19 0001      Lumbar Exercises: Stretches   Passive Hamstring Stretch Limitations seated EOB    Hip Flexor Stretch Limitations lunge position in chair      Lumbar Exercises: Aerobic   Nustep 5 min L6 UE & LE      Lumbar Exercises: Standing   Other Standing Lumbar Exercises standing hip hinge with dowel      Lumbar Exercises: Seated   Other Seated Lumbar Exercises seated hip hinge with dowel      Lumbar Exercises: Supine   Dead Bug Limitations LE extension with maintainted pelvic tilt    Other Supine Lumbar Exercises mini crunch, cues for breathing      Lumbar Exercises: Prone   Other Prone Lumbar Exercises prone hip ext over 2 pillows, knee slightly flexed      Lumbar Exercises: Quadruped   Opposite  Arm/Leg Raise Right arm/Left leg;Left arm/Right leg;10 reps;3 seconds      Manual Therapy   Manual Therapy Soft tissue mobilization    Soft tissue mobilization bil lumbar paraspinals & superior glut max fibers                    PT Short Term Goals - 12/09/19 2010      PT SHORT TERM GOAL #1   Title Patient will be independent with initial HEP.    Baseline Pt received initial HEP at evaluation 12/09/2019.    Time 3    Period Weeks    Status New    Target Date 12/30/19      PT SHORT TERM GOAL #2   Title Pt will be able to tolerate standing/walking on level ground for > 30 minutes without significant complaints of pain or having to take seated rest break.    Baseline Pt has to take seated rest break after less than 30 min of standing or walking    Time 3    Period Weeks    Status New    Target Date 12/30/19      PT SHORT TERM GOAL #3   Title Pt will be able to assume R sidelying position to sleep for at least 1 night without having to reposition to supine due to pain.    Baseline Pt currently sleeps in supine with BLE elevated due  to pain when in right sidelying.    Time 3    Period Weeks    Status New    Target Date 12/30/19      PT SHORT TERM GOAL #4   Title Pt will improve FOTO score to 45% (55% limitation) from 40% at evaluation.    Baseline 60% limited; predicted 43% limited    Time 3    Period Weeks    Status New    Target Date 12/30/19             PT Long Term Goals - 12/09/19 2013      PT LONG TERM GOAL #1   Title Pt will be independent with advanced HEP.    Baseline Pt received initial HEP at evaluation 12/09/2019.    Time 6    Period Weeks    Status New    Target Date 01/20/20      PT LONG TERM GOAL #2   Title Pt will be able to tolerate standing/walking on level ground for at least 1 hour without significant complaints of pain or having to take seated rest break.    Baseline Pt has to take seated rest break after less than 30 min of standing or walking    Time 6    Period Weeks    Status New    Target Date 01/20/20      PT LONG TERM GOAL #3   Title Pt will be able to assume R sidelying position to sleep for at least 4/7 nights without having to reposition to supine due to pain.    Baseline Pt currently sleeps in supine with BLE elevated due to pain when in right sidelying.    Time 6    Period Weeks    Status New    Target Date 01/20/20      PT LONG TERM GOAL #4   Title Pt will improve FOTO score to 57% (43% limitation) from 40% at evaluation.    Baseline 60% limited; predicted 43% limited    Time 6  Period Weeks    Status New    Target Date 01/20/20                 Plan - 12/28/19 1754    Clinical Impression Statement Progressed core and lumbar stability following manual to lumbopelvic musculature. Fatigue without pain reported today. notable compensation through lumbar musculature when performing hip extension.    PT Treatment/Interventions ADLs/Self Care Home Management;Aquatic Therapy;Cryotherapy;Electrical Stimulation;Iontophoresis 4mg /ml Dexamethasone;Moist  Heat;Traction;Gait training;Stair training;Functional mobility training;Therapeutic activities;Therapeutic exercise;Balance training;Neuromuscular re-education;Patient/family education;Manual techniques;Dry needling;Passive range of motion;Taping    PT Next Visit Plan advance HEP PRN, cont core strength    PT Home Exercise Plan [id number] - lumbar flexion (demonstrated and instructed pt to perform in sitting)    Consulted and Agree with Plan of Care Patient           Patient will benefit from skilled therapeutic intervention in order to improve the following deficits and impairments:  Decreased activity tolerance, Decreased endurance, Decreased mobility, Decreased strength, Decreased range of motion, Difficulty walking, Increased muscle spasms, Pain, Improper body mechanics  Visit Diagnosis: Chronic bilateral low back pain without sciatica  Muscle spasm of back  Difficulty in walking, not elsewhere classified  Muscle weakness (generalized)     Problem List Patient Active Problem List   Diagnosis Date Noted   Non-intractable vomiting 10/25/2019   Left knee pain 01/18/2019   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 Breaker Springer PT, DPT 12/28/19 5:56 PM   [facility] [address] [place], [place], [address] Phone: [phone]   Fax:  [phone]  Name: Jennifer [name] MRN: [id number] Date of Birth: 30-Jan-1961

## 2019-12-30 ENCOUNTER — Other Ambulatory Visit: Payer: Self-pay

## 2019-12-30 ENCOUNTER — Ambulatory Visit: Payer: Managed Care, Other (non HMO)

## 2019-12-30 DIAGNOSIS — M545 Low back pain, unspecified: Secondary | ICD-10-CM

## 2019-12-30 DIAGNOSIS — R262 Difficulty in walking, not elsewhere classified: Secondary | ICD-10-CM

## 2019-12-30 DIAGNOSIS — M62830 Muscle spasm of back: Secondary | ICD-10-CM

## 2019-12-30 DIAGNOSIS — G8929 Other chronic pain: Secondary | ICD-10-CM

## 2019-12-30 DIAGNOSIS — M6281 Muscle weakness (generalized): Secondary | ICD-10-CM

## 2019-12-30 NOTE — Therapy (Signed)
[place], [place], [address] Phone: [phone]   Fax:  [phone]  Physical Therapy Treatment  Patient Details  Name: Jennifer [name] MRN: [id number] Date of Birth: 1960-06-08 Referring Provider (PT): Thurman Coyer, DO   Encounter Date: 12/30/2019   PT End of Session - 12/30/19 1818    Visit Number 4    Number of Visits 13    Date for PT Re-Evaluation 01/22/20    Authorization Type [employer] - Vaso not covered - FOTO at visit 6 and visit 10    PT Start Time 1821    PT Stop Time 1902    PT Time Calculation (min) 41 min    Activity Tolerance Patient tolerated treatment well    Behavior During Therapy [facility] for tasks assessed/performed           Past Medical History:  Diagnosis Date  . Allergy    allergic rhinitis  . Anemia 06/2007   C-scope 06/2007: tics, neg hemocults 07/2007  . Anxiety   . Blood transfusion without reported diagnosis    at birth  . Chest pain 12/2007   negative ECHO, neg stress test 06/2008  . Condyloma acuminatum    history of  . Hyperglycemia    borderline. A1c 5.7  12/2007  . Hypertension   . Personal history of atrial fibrillation    on carvedilol only   . Sleep apnea    wears cpap at night    Past Surgical History:  Procedure Laterality Date  . ABDOMINAL HYSTERECTOMY     no oophorectomy  . BREAST EXCISIONAL BIOPSY    . BREAST SURGERY  age 30   Fibroid tumor removed right breast  . CARPAL TUNNEL RELEASE    . CESAREAN SECTION    . CHOLECYSTECTOMY    . COLONOSCOPY      There were no vitals filed for this visit.   Subjective Assessment - 12/30/19 1817    Limitations Standing;Walking;House hold activities    How long can you sit comfortably? No complaints with prolonged sitting itself but would have trouble standing/walking after prolonged sitting    How long can you stand comfortably? Less than 30 minutes before needing to sit down    How long can you walk  comfortably? Less than 30 minutes before needing to sit down - used to walk 3-4x/week ~30-45 minutes until July 2021 but cannot tolerate it now    Patient Stated Goals walking with her cousins    Pain Onset 1 to 4 weeks ago              [facility] PT Assessment - 12/30/19 0001      Assessment   Medical Diagnosis Acute bilateral low back pain without sciatica M54.50    Referring Provider (PT) Thurman Coyer, DO                         [facility] Adult PT Treatment/Exercise - 12/30/19 0001      Self-Care   Self-Care Other Self-Care Comments    Other Self-Care Comments  Discussed updated HEP that pt received Tuesday, advised pt to get out of car and stretch often during her drive to [facility] for Thanksgiving and to perform posterior pelvic tilts throughout drive, reviewed log roll for optimal supine>sit transfer but pt reports that she tried and has trouble rolling in her bed due to very soft mattress. 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 - PT and pt discussed lumbar support, core activation, and stretching often to allow pt to follow through with desired occuptation with better tolerance/decreased back pain      Lumbar Exercises: Stretches   Passive Hamstring Stretch Right;Left;2 reps;30 seconds    Passive Hamstring Stretch Limitations seated at EOM    Hip Flexor Stretch Right;Left;1 rep;20 seconds    Hip Flexor Stretch Limitations Standing and holding ankle      Lumbar Exercises: Aerobic   Nustep L7 x 6 min UE and LE      Lumbar Exercises: Standing   Other Standing Lumbar Exercises Standing hip hinge with dowel x 10    Other Standing Lumbar Exercises Standing hip EXT x 20 each LE; cues for form to avoid lumbar compensation and forward trunk lean      Lumbar Exercises: Seated   Other Seated Lumbar Exercises seated hip hinge with dowel x 20    Other Seated Lumbar Exercises Alternating marches  while sitting on green swiss ball x 30 (15x each LE)      Lumbar Exercises: Supine   AB Set Limitations Standing pallof press with rotation with 1 green theraband 10x each direction    Pelvic Tilt 10 reps    Pelvic Tilt Limitations cues for breathing    Dead Bug Limitations LE extension with maintainted pelvic tilt 15x each LE    Other Supine Lumbar Exercises Mini crunch pressing BUE into red swiss ball with cues for breathing x 20                  PT Education - 12/30/19 1913    Education Details Discussed updated HEP that pt received Tuesday, advised pt to get out of car and stretch often during her drive to [facility] for Thanksgiving and to perform posterior pelvic tilts throughout drive, reviewed log roll for optimal supine>sit transfer but pt reports that she tried and has trouble rolling in her bed due to very soft mattress. 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    Person(s) Educated Patient    Methods Explanation;Demonstration;Tactile cues;Verbal cues    Comprehension Verbalized understanding;Returned demonstration;Verbal cues required;Tactile cues required            PT Short Term Goals - 12/30/19 1919      PT SHORT TERM GOAL #1   Title Patient will be independent with initial HEP.    Baseline Pt received initial HEP at evaluation 12/09/2019.    Time 3    Period Weeks    Status Achieved    Target Date 12/30/19      PT SHORT TERM GOAL #2   Title Pt will be able to tolerate standing/walking on level ground for > 30 minutes without significant complaints of pain or having to take seated rest break.    Baseline Pt has to take seated rest break after less than 30 min of standing or walking    Time 3    Period Weeks     Status New    Target Date 12/30/19      PT SHORT TERM GOAL #3   Title Pt will be able to assume R sidelying position to sleep for at  least 1 night without having to reposition to supine due to pain.    Baseline Pt currently sleeps in supine with BLE elevated due to pain when in right sidelying.    Time 3    Period Weeks    Status New    Target Date 12/30/19      PT SHORT TERM GOAL #4   Title Pt will improve FOTO score to 45% (55% limitation) from 40% at evaluation.    Baseline 60% limited; predicted 43% limited; will reassess FOTO score at visit 6    Time 3    Period Weeks    Status On-going    Target Date 12/30/19             PT Long Term Goals - 12/09/19 2013      PT LONG TERM GOAL #1   Title Pt will be independent with advanced HEP.    Baseline Pt received initial HEP at evaluation 12/09/2019.    Time 6    Period Weeks    Status New    Target Date 01/20/20      PT LONG TERM GOAL #2   Title Pt will be able to tolerate standing/walking on level ground for at least 1 hour without significant complaints of pain or having to take seated rest break.    Baseline Pt has to take seated rest break after less than 30 min of standing or walking    Time 6    Period Weeks    Status New    Target Date 01/20/20      PT LONG TERM GOAL #3   Title Pt will be able to assume R sidelying position to sleep for at least 4/7 nights without having to reposition to supine due to pain.    Baseline Pt currently sleeps in supine with BLE elevated due to pain when in right sidelying.    Time 6    Period Weeks    Status New    Target Date 01/20/20      PT LONG TERM GOAL #4   Title Pt will improve FOTO score to 57% (43% limitation) from 40% at evaluation.    Baseline 60% limited; predicted 43% limited    Time 6    Period Weeks    Status New    Target Date 01/20/20                 Plan - 12/30/19 1818    Clinical Impression Statement Patient demonstrated good tolerance with  addition of pallof press with rotation and alternating marches sitting on green swiss ball with no complaints of pain. Pt expressed difficulty and fatigue with hip hinge but no pain. Several cues provided for breathing during interventions due to patient initially holding breath during posterior pelvic tilts.    Personal Factors and Comorbidities Comorbidity 1;Comorbidity 2;Comorbidity 3+;Fitness;Time since onset of injury/illness/exacerbation    Comorbidities anxiety, HTN, hyperglycemia, afib    Examination-Activity Limitations Carry;Locomotion Level;Squat;Stairs;Stand;Bed Mobility    Examination-Participation Restrictions Community Activity;Shop;Cleaning    Stability/Clinical Decision Making Stable/Uncomplicated    Rehab Potential Good    PT Frequency 2x / week    PT Duration 6 weeks    PT Treatment/Interventions ADLs/Self Care Home Management;Aquatic Therapy;Cryotherapy;Electrical Stimulation;Iontophoresis 4mg /ml Dexamethasone;Moist Heat;Traction;Gait training;Stair training;Functional mobility training;Therapeutic activities;Therapeutic exercise;Balance training;Neuromuscular re-education;Patient/family education;Manual techniques;Dry needling;Passive range of motion;Taping    PT Next Visit Plan Assess short term goals. Review HEP and update PRN, continue core strengthening as tolerated    PT Home  Exercise Plan [id number] - lumbar flexion (demonstrated and instructed pt to perform in sitting), seated HS stretch, bird dog, seated and standing hip flexor stretch, curl up with reach (mini crunch), hip hinge, posterior pelvic tilt    Consulted and Agree with Plan of Care Patient           Patient will benefit from skilled therapeutic intervention in order to improve the following deficits and impairments:  Decreased activity tolerance, Decreased endurance, Decreased mobility, Decreased strength, Decreased range of motion, Difficulty walking, Increased muscle spasms, Pain, Improper body  mechanics  Visit Diagnosis: Chronic bilateral low back pain without sciatica  Muscle spasm of back  Difficulty in walking, not elsewhere classified  Muscle weakness (generalized)     Problem List Patient Active Problem List   Diagnosis Date Noted  . Non-intractable vomiting 10/25/2019  . Left knee pain 01/18/2019  . 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 Elevated LDL cholesterol level 04/21/2018  . OSA (obstructive sleep apnea) 03/13/2016  . Hyperglycemia 01/12/2016  . Atrial fibrillation ([place]) 11/16/2015  . Esophageal reflux 10/16/2012  . 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 Obesity 12/20/2011  . HOT FLASHES 04/27/2010  . CHOLECYSTECTOMY, LAPAROSCOPIC, HX OF 08/09/2009  . Allergic rhinitis 04/26/2008  . Anemia 05/05/2007  . CONDYLOMA ACUMINATUM 04/15/2007  . Essential hypertension 07/03/2006     Haydee Monica, PT, DPT 12/30/19 7:22 PM  [facility] [address] [place], [place], [address] Phone: [phone]   Fax:  [phone]  Name: Jennifer [name] MRN: [id number] Date of Birth: [birth year]

## 2020-01-04 ENCOUNTER — Other Ambulatory Visit: Payer: Self-pay

## 2020-01-04 ENCOUNTER — Ambulatory Visit: Payer: Managed Care, Other (non HMO)

## 2020-01-04 DIAGNOSIS — M545 Low back pain, unspecified: Secondary | ICD-10-CM | POA: Diagnosis not present

## 2020-01-04 DIAGNOSIS — M6281 Muscle weakness (generalized): Secondary | ICD-10-CM

## 2020-01-04 DIAGNOSIS — M62830 Muscle spasm of back: Secondary | ICD-10-CM

## 2020-01-04 DIAGNOSIS — G8929 Other chronic pain: Secondary | ICD-10-CM

## 2020-01-04 DIAGNOSIS — R262 Difficulty in walking, not elsewhere classified: Secondary | ICD-10-CM

## 2020-01-04 NOTE — Therapy (Signed)
[place], [place], [address] Phone: [phone]   Fax:  [phone]  Physical Therapy Treatment  Patient Details  Name: Jennifer [name] MRN: [id number] Date of Birth: 12-26-1960 Referring Provider (PT): Thurman Coyer, DO   Encounter Date: 01/04/2020   PT End of Session - 01/04/20 1754    Visit Number 5    Number of Visits 13    Date for PT Re-Evaluation 01/22/20    Authorization Type [employer] - Vaso not covered - FOTO at visit 6 and visit 10    PT Start Time 1751    PT Stop Time 1830    PT Time Calculation (min) 39 min    Activity Tolerance Patient tolerated treatment well    Behavior During Therapy [facility] for tasks assessed/performed           Past Medical History:  Diagnosis Date   Allergy    allergic rhinitis   Anemia 06/2007   C-scope 06/2007: tics, neg hemocults 07/2007   Anxiety    Blood transfusion without reported diagnosis    at birth   Chest pain 12/2007   negative ECHO, neg stress test 06/2008   Condyloma acuminatum    history of   Hyperglycemia    borderline. A1c 5.7  12/2007   Hypertension    Personal history of atrial fibrillation    on carvedilol only    Sleep apnea    wears cpap at night    Past Surgical History:  Procedure Laterality Date   ABDOMINAL HYSTERECTOMY     no oophorectomy   BREAST EXCISIONAL BIOPSY     BREAST SURGERY  age 11   Fibroid tumor removed right breast   CARPAL TUNNEL RELEASE     CESAREAN SECTION     CHOLECYSTECTOMY     COLONOSCOPY      There were no vitals filed for this visit.   Subjective Assessment - 01/04/20 1755    Subjective My low back is getting better. No pain to today and only a little yesterday.    Patient Stated Goals walking with her cousins    Currently in Pain? No/denies    Pain Score 0-No pain    Pain Location Back    Pain Orientation Posterior;Lower    Pain Onset 1 to 4 weeks ago    Pain Frequency  Intermittent                             [facility] Adult PT Treatment/Exercise - 01/04/20 0001      Lumbar Exercises: Stretches   Hip Flexor Stretch Right;Left;1 rep;20 seconds    Hip Flexor Stretch Limitations Standing and holding ankle      Lumbar Exercises: Aerobic   Nustep L7 x 6 min UE and LE      Lumbar Exercises: Standing   Row Both;15 reps    Theraband Level (Row) Level 3 (Green)    Shoulder Extension Both;15 reps    Theraband Level (Shoulder Extension) Level 3 (Green)    Other Standing Lumbar Exercises Standing hip hinge with dowel x 10    Other Standing Lumbar Exercises Standing hip EXT x 20 each LE      Lumbar Exercises: Supine   AB Set Limitations Standing pallof press with rotation with 1 green theraband 10x each direction    Pelvic Tilt 15 reps    Dead Bug Limitations LE extension with maintainted pelvic tilt 15x  each LE    Other Supine Lumbar Exercises Mini crunch pressing BUE into red swiss ball with cues for breathing x 20                  PT Education - 01/04/20 2210    Education Details Reviewed recommendations to reduce back pain with her drivig to [facility] for Thanksgiving- stopping to walk and stretch, completing pelvic tilts while sitting to decrease low back pressure.    Person(s) Educated Patient    Methods Explanation    Comprehension Verbalized understanding            PT Short Term Goals - 12/30/19 1919      PT SHORT TERM GOAL #1   Title Patient will be independent with initial HEP.    Baseline Pt received initial HEP at evaluation 12/09/2019.    Time 3    Period Weeks    Status Achieved    Target Date 12/30/19      PT SHORT TERM GOAL #2   Title Pt will be able to tolerate standing/walking on level ground for > 30 minutes without significant complaints of pain or having to take seated rest break.    Baseline Pt has to take seated rest break after less than 30 min of standing or walking    Time 3    Period Weeks     Status New    Target Date 12/30/19      PT SHORT TERM GOAL #3   Title Pt will be able to assume R sidelying position to sleep for at least 1 night without having to reposition to supine due to pain.    Baseline Pt currently sleeps in supine with BLE elevated due to pain when in right sidelying.    Time 3    Period Weeks    Status New    Target Date 12/30/19      PT SHORT TERM GOAL #4   Title Pt will improve FOTO score to 45% (55% limitation) from 40% at evaluation.    Baseline 60% limited; predicted 43% limited; will reassess FOTO score at visit 6    Time 3    Period Weeks    Status On-going    Target Date 12/30/19             PT Long Term Goals - 12/09/19 2013      PT LONG TERM GOAL #1   Title Pt will be independent with advanced HEP.    Baseline Pt received initial HEP at evaluation 12/09/2019.    Time 6    Period Weeks    Status New    Target Date 01/20/20      PT LONG TERM GOAL #2   Title Pt will be able to tolerate standing/walking on level ground for at least 1 hour without significant complaints of pain or having to take seated rest break.    Baseline Pt has to take seated rest break after less than 30 min of standing or walking    Time 6    Period Weeks    Status New    Target Date 01/20/20      PT LONG TERM GOAL #3   Title Pt will be able to assume R sidelying position to sleep for at least 4/7 nights without having to reposition to supine due to pain.    Baseline Pt currently sleeps in supine with BLE elevated due to pain when in right sidelying.    Time  6    Period Weeks    Status New    Target Date 01/20/20      PT LONG TERM GOAL #4   Title Pt will improve FOTO score to 57% (43% limitation) from 40% at evaluation.    Baseline 60% limited; predicted 43% limited    Time 6    Period Weeks    Status New    Target Date 01/20/20                 Plan - 01/04/20 2214    Clinical Impression Statement Pt's report indicates improvement in pain.  Posterior chain strengthening exs were added to the ther ex program today and tolerated well. Reminders for proper breathing c abdominal strengthening exs continue to be provided. Pt was able to return demonstration.    Personal Factors and Comorbidities Comorbidity 1;Comorbidity 2;Comorbidity 3+;Fitness;Time since onset of injury/illness/exacerbation    Comorbidities anxiety, HTN, hyperglycemia, afib    Examination-Activity Limitations Carry;Locomotion Level;Squat;Stairs;Stand;Bed Mobility    Examination-Participation Restrictions Community Activity;Shop;Cleaning    Stability/Clinical Decision Making Stable/Uncomplicated    Clinical Decision Making Low    Rehab Potential Good    PT Frequency 2x / week    PT Duration 6 weeks    PT Treatment/Interventions ADLs/Self Care Home Management;Aquatic Therapy;Cryotherapy;Electrical Stimulation;Iontophoresis 4mg /ml Dexamethasone;Moist Heat;Traction;Gait training;Stair training;Functional mobility training;Therapeutic activities;Therapeutic exercise;Balance training;Neuromuscular re-education;Patient/family education;Manual techniques;Dry needling;Passive range of motion;Taping    PT Next Visit Plan Assess STGs. Review HEP and update PRN, continue core strengthening as tolerated    PT Home Exercise Plan [id number] - lumbar flexion (demonstrated and instructed pt to perform in sitting), seated HS stretch, bird dog, seated and standing hip flexor stretch, curl up with reach (mini crunch), hip hinge, posterior pelvic tilt    Consulted and Agree with Plan of Care Patient           Patient will benefit from skilled therapeutic intervention in order to improve the following deficits and impairments:  Decreased activity tolerance, Decreased endurance, Decreased mobility, Decreased strength, Decreased range of motion, Difficulty walking, Increased muscle spasms, Pain, Improper body mechanics  Visit Diagnosis: Chronic bilateral low back pain without  sciatica  Muscle spasm of back  Difficulty in walking, not elsewhere classified  Muscle weakness (generalized)     Problem List Patient Active Problem List   Diagnosis Date Noted   Non-intractable vomiting 10/25/2019   Left knee pain 01/18/2019   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 [place], [place], [address] Phone: [phone]   Fax:  [phone]  Name: Jennifer [name] MRN: [id number] Date of Birth: Aug 21, 1960

## 2020-01-11 ENCOUNTER — Ambulatory Visit: Payer: Managed Care, Other (non HMO)

## 2020-01-11 ENCOUNTER — Other Ambulatory Visit: Payer: Self-pay

## 2020-01-11 DIAGNOSIS — R262 Difficulty in walking, not elsewhere classified: Secondary | ICD-10-CM

## 2020-01-11 DIAGNOSIS — M6281 Muscle weakness (generalized): Secondary | ICD-10-CM

## 2020-01-11 DIAGNOSIS — M545 Low back pain, unspecified: Secondary | ICD-10-CM

## 2020-01-11 DIAGNOSIS — M62830 Muscle spasm of back: Secondary | ICD-10-CM

## 2020-01-11 NOTE — Therapy (Addendum)
[place], [place], [address] Phone: [phone]   Fax:  [phone]  Physical Therapy Treatment/Discharge Summary  Patient Details  Name: Jennifer [name] MRN: [id number] Date of Birth: 06-30-1960 Referring Provider (PT): Thurman Coyer, DO   Encounter Date: 01/11/2020   PT End of Session - 01/11/20 1756    Visit Number 6    Number of Visits 13    Date for PT Re-Evaluation 01/22/20    Authorization Type [employer] - Vaso not covered - FOTO at visit 6 and visit 10    PT Start Time 1749    PT Stop Time 1836    PT Time Calculation (min) 47 min    Activity Tolerance Patient tolerated treatment well    Behavior During Therapy [facility] for tasks assessed/performed           Past Medical History:  Diagnosis Date  . Allergy    allergic rhinitis  . Anemia 06/2007   C-scope 06/2007: tics, neg hemocults 07/2007  . Anxiety   . Blood transfusion without reported diagnosis    at birth  . Chest pain 12/2007   negative ECHO, neg stress test 06/2008  . Condyloma acuminatum    history of  . Hyperglycemia    borderline. A1c 5.7  12/2007  . Hypertension   . Personal history of atrial fibrillation    on carvedilol only   . Sleep apnea    wears cpap at night    Past Surgical History:  Procedure Laterality Date  . ABDOMINAL HYSTERECTOMY     no oophorectomy  . BREAST EXCISIONAL BIOPSY    . BREAST SURGERY  age 66   Fibroid tumor removed right breast  . CARPAL TUNNEL RELEASE    . CESAREAN SECTION    . CHOLECYSTECTOMY    . COLONOSCOPY      There were no vitals filed for this visit.   Subjective Assessment - 01/11/20 1757    Subjective Pt reports she did well with traveling to [facility] and back. She did exs along the way. She denies low back pain, but did have posterior leg pain which was at a low level. She remarked this trip to [facility] was better than the one she took in Sept.    Patient Stated Goals walking with  her cousins    Currently in Pain? No/denies    Pain Score 0-No pain    Pain Location Back    Pain Orientation Lower;Posterior    Pain Onset 1 to 4 weeks ago    Pain Frequency Intermittent    Aggravating Factors  Cold outside    Pain Relieving Factors Sitting                      FOTO: 28% limited; predicted 43% limitation       [facility] Adult PT Treatment/Exercise - 01/11/20 0001      Exercises   Exercises Neck;Lumbar      Neck Exercises: Seated   Neck Retraction 5 reps;3 secs      Lumbar Exercises: Standing   Other Standing Lumbar Exercises Hinged hip lifting 10#, 8" surface to waist height.     Other Standing Lumbar Exercises SIngle hinged hip 1 handed lifts from 20" surface      Lumbar Exercises: Supine   Pelvic Tilt 15 reps    Pelvic Tilt Limitations cues for breathing    Dead Bug Limitations LE extension with maintainted pelvic tilt 15x each  LE      Neck Exercises: Stretches   Upper Trapezius Stretch Right;Left;2 reps;20 seconds    Levator Stretch Right;Left;2 reps;20 seconds                  PT Education - 01/11/20 2231    Education Details HEP, body mchanics for  household acivities    Person(s) Educated Patient    Methods Explanation;Demonstration;Tactile cues;Verbal cues;Handout    Comprehension Verbalized understanding;Returned demonstration;Verbal cues required;Tactile cues required;Need further instruction            PT Short Term Goals - 12/30/19 1919      PT SHORT TERM GOAL #1   Title Patient will be independent with initial HEP.    Baseline Pt received initial HEP at evaluation 12/09/2019.    Time 3    Period Weeks    Status Achieved    Target Date 12/30/19      PT SHORT TERM GOAL #2   Title Pt will be able to tolerate standing/walking on level ground for > 30 minutes without significant complaints of pain or having to take seated rest break.    Baseline Pt has to take seated rest break after less than 30 min of standing  or walking    Time 3    Period Weeks    Status New    Target Date 12/30/19      PT SHORT TERM GOAL #3   Title Pt will be able to assume R sidelying position to sleep for at least 1 night without having to reposition to supine due to pain.    Baseline Pt currently sleeps in supine with BLE elevated due to pain when in right sidelying.    Time 3    Period Weeks    Status New    Target Date 12/30/19      PT SHORT TERM GOAL #4   Title Pt will improve FOTO score to 45% (55% limitation) from 40% at evaluation.    Baseline 60% limited; predicted 43% limited; will reassess FOTO score at visit 6    Time 3    Period Weeks    Status On-going    Target Date 12/30/19             PT Long Term Goals - 01/11/20 2245      PT LONG TERM GOAL #4   Title Pt will improve FOTO score to 57% (43% limitation) from 40% at evaluation. Improved to 72%    Baseline 60% limited; predicted 43% limited    Status Achieved    Target Date 01/11/20                 Plan - 01/11/20 2234    Clinical Impression Statement Pt did well managing her low back/LE pain with exs and rest breaks with an extended trip to [facility]. Cervical stretching exs were provided due to pt's report of neck tightness and pain related to using a microscope continuously with her work.Pt returned demonstration for proper body mechanics assosciated with household activities. Pt's improvement is reflected in her re-assessed FOTO survey, with this score improved to 72% from 40%.    Personal Factors and Comorbidities Comorbidity 1;Comorbidity 2;Comorbidity 3+;Fitness;Time since onset of injury/illness/exacerbation    Comorbidities anxiety, HTN, hyperglycemia, afib    Examination-Activity Limitations Carry;Locomotion Level;Squat;Stairs;Stand;Bed Mobility    Examination-Participation Restrictions Community Activity;Shop;Cleaning    Stability/Clinical Decision Making Stable/Uncomplicated    Clinical Decision Making Low    Rehab Potential  Good  PT Frequency 2x / week    PT Duration 6 weeks    PT Treatment/Interventions ADLs/Self Care Home Management;Aquatic Therapy;Cryotherapy;Electrical Stimulation;Iontophoresis 4mg /ml Dexamethasone;Moist Heat;Traction;Gait training;Stair training;Functional mobility training;Therapeutic activities;Therapeutic exercise;Balance training;Neuromuscular re-education;Patient/family education;Manual techniques;Dry needling;Passive range of motion;Taping    PT Next Visit Plan Due to pt's progress, she is cancelling her other PT appt this week. With her appt next week, assess response to cervical stretches. Possible DC if pt is continuing to do well regarding back pain    PT Home Exercise Plan [id number] - lumbar flexion (demonstrated and instructed pt to perform in sitting), seated HS stretch, bird dog, seated and standing hip flexor stretch, curl up with reach (mini crunch), hip hinge, posterior pelvic tilt. cervical retraction, upper trap stretch, levator scapulae stretch    Consulted and Agree with Plan of Care Patient           Patient will benefit from skilled therapeutic intervention in order to improve the following deficits and impairments:  Decreased activity tolerance, Decreased endurance, Decreased mobility, Decreased strength, Decreased range of motion, Difficulty walking, Increased muscle spasms, Pain, Improper body mechanics  Visit Diagnosis: Chronic bilateral low back pain without sciatica  Muscle spasm of back  Difficulty in walking, not elsewhere classified  Muscle weakness (generalized)     Problem List Patient Active Problem List   Diagnosis Date Noted  . Non-intractable vomiting 10/25/2019  . Left knee pain 01/18/2019  . 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 Elevated LDL cholesterol level 04/21/2018  . OSA (obstructive sleep apnea) 03/13/2016  . Hyperglycemia 01/12/2016  . Atrial fibrillation ([place]) 11/16/2015  . Esophageal reflux 10/16/2012  . Periodic headache syndrome  08/21/2012  . Obesity 12/20/2011  . HOT FLASHES 04/27/2010  . CHOLECYSTECTOMY, LAPAROSCOPIC, HX OF 08/09/2009  . Allergic rhinitis 04/26/2008  . Anemia 05/05/2007  . CONDYLOMA ACUMINATUM 04/15/2007  . Essential hypertension 07/03/2006   PHYSICAL THERAPY DISCHARGE SUMMARY  Visits from Start of Care: 6  Current functional level related to goals / functional outcomes: See above   Remaining deficits: See above   Education / Equipment: See above Plan: Patient agrees to discharge.  Patient goals were met. Patient is being discharged due to meeting the stated rehab goals.  ?????         PT spoke with patient after she did not return following 6th appointment. Patient has demonstrated significant progress, achieved goals, and is pleased with her functional level.   Haydee Monica, PT, DPT 01/27/20 7:22 PM    Gar Ponto MS, PT 01/11/20 11:04 PM  [facility] [address] [place], [place], [address] Phone: [phone]   Fax:  [phone]  Name: Jennifer [name] MRN: [id number] Date of Birth: 02/26/1960

## 2020-01-13 ENCOUNTER — Ambulatory Visit: Payer: Managed Care, Other (non HMO)

## 2020-01-18 ENCOUNTER — Ambulatory Visit: Payer: Managed Care, Other (non HMO)

## 2020-01-20 MED FILL — CARVEDILOL 25 MG TABS: 25 | 30 days supply | Qty: 60 | Fill #5

## 2020-01-27 ENCOUNTER — Telehealth: Payer: Self-pay

## 2020-01-27 NOTE — Telephone Encounter (Signed)
PT called and left voicemail due to patient cancelling last week's appointments and not having additional appointments scheduled. PT and pt had previously discussed D/C if patient continued to have relief of symptoms since her last session. PT stated that if pt does not return call, plan will be to D/C. PT informed pt via voicemail that she may obtain new referral to continue PT in the future if she wishes.  Haydee Monica, PT, DPT 01/27/20 5:32 PM

## 2020-01-27 NOTE — Telephone Encounter (Signed)
Patient returned PT's phone call and explained that she had cancelled last week's appointments due to attending Christmas parties. She expresses that she continues to be pain free and is pleased with her progress in PT. PT informed pt she can obtain referral from doctor if she requires physical therapy in the future.  Haydee Monica, PT, DPT 01/27/20 6:01 PM

## 2020-02-29 MED FILL — CARVEDILOL 25 MG TABS: 25 | 30 days supply | Qty: 60 | Fill #6

## 2020-04-10 ENCOUNTER — Ambulatory Visit (INDEPENDENT_AMBULATORY_CARE_PROVIDER_SITE_OTHER): Payer: Managed Care, Other (non HMO) | Admitting: Nurse Practitioner

## 2020-04-10 ENCOUNTER — Other Ambulatory Visit: Payer: Self-pay

## 2020-04-10 ENCOUNTER — Encounter: Payer: Self-pay | Admitting: Nurse Practitioner

## 2020-04-10 VITALS — BP 138/90 | HR 76 | Temp 97.3°F | Ht 62.0 in | Wt 245.6 lb

## 2020-04-10 DIAGNOSIS — Z6841 Body Mass Index (BMI) 40.0 and over, adult: Secondary | ICD-10-CM

## 2020-04-10 DIAGNOSIS — G4733 Obstructive sleep apnea (adult) (pediatric): Secondary | ICD-10-CM

## 2020-04-10 DIAGNOSIS — R739 Hyperglycemia, unspecified: Secondary | ICD-10-CM | POA: Diagnosis not present

## 2020-04-10 DIAGNOSIS — I10 Essential (primary) hypertension: Secondary | ICD-10-CM

## 2020-04-10 DIAGNOSIS — E7800 Pure hypercholesterolemia, unspecified: Secondary | ICD-10-CM

## 2020-04-10 DIAGNOSIS — Z0001 Encounter for general adult medical examination with abnormal findings: Secondary | ICD-10-CM | POA: Diagnosis not present

## 2020-04-10 DIAGNOSIS — E66813 Obesity, class 3: Secondary | ICD-10-CM

## 2020-04-10 LAB — CBC
HCT: 38.1 % (ref 36.0–46.0)
Hemoglobin: 12.5 g/dL (ref 12.0–15.0)
MCHC: 32.7 g/dL (ref 30.0–36.0)
MCV: 89 fl (ref 78.0–100.0)
Platelets: 296 10*3/uL (ref 150.0–400.0)
RBC: 4.28 Mil/uL (ref 3.87–5.11)
RDW: 14 % (ref 11.5–15.5)
WBC: 5.6 10*3/uL (ref 4.0–10.5)

## 2020-04-10 LAB — COMPREHENSIVE METABOLIC PANEL
ALT: 12 U/L (ref 0–35)
AST: 14 U/L (ref 0–37)
Albumin: 4.2 g/dL (ref 3.5–5.2)
Alkaline Phosphatase: 66 U/L (ref 39–117)
BUN: 11 mg/dL (ref 6–23)
CO2: 29 mEq/L (ref 19–32)
Calcium: 9.7 mg/dL (ref 8.4–10.5)
Chloride: 102 mEq/L (ref 96–112)
Creatinine, Ser: 0.77 mg/dL (ref 0.40–1.20)
GFR: 83.31 mL/min (ref >60.00)
Glucose, Bld: 98 mg/dL (ref 70–99)
Potassium: 4.4 mEq/L (ref 3.5–5.1)
Sodium: 139 mEq/L (ref 135–145)
Total Bilirubin: 0.7 mg/dL (ref 0.2–1.2)
Total Protein: 7.4 g/dL (ref 6.0–8.3)

## 2020-04-10 LAB — LIPID PANEL
Cholesterol: 217 mg/dL — ABNORMAL HIGH (ref 0–200)
HDL: 53.9 mg/dL (ref >39.00)
LDL Cholesterol: 149 mg/dL — ABNORMAL HIGH (ref 0–99)
NonHDL: 163.19
Total CHOL/HDL Ratio: 4
Triglycerides: 72 mg/dL (ref 0.0–149.0)
VLDL: 14.4 mg/dL (ref 0.0–40.0)

## 2020-04-10 LAB — HEMOGLOBIN A1C: Hgb A1c MFr Bld: 5.6 % (ref 4.6–6.5)

## 2020-04-10 NOTE — Progress Notes (Signed)
Subjective:    Patient ID: Jennifer [name], female    DOB: [birth year], 60 y.o.   MRN: [id number]  Patient presents today for CPE and eval of chronic conditions  HPI Essential hypertension BP at goal BP Readings from Last 3 Encounters:  04/10/20 138/90  12/21/19 (!) 168/73  11/16/19 (!) 159/90   continue amlodipine, coreg, and benicar Stable renal function  OSA (obstructive sleep apnea) Needs order for new CPAP supplies She has not use machine in over 1year DME order faxed Advised about the importance of CPAP compliance  Hyperglycemia Stable hgbA1c at 5.8  Elevated LDL cholesterol level ASCVD risk of 9.7%  recommended pravastatin Lipid Panel     Component Value Date/Time   CHOL 217 (H) 04/10/2020 0848   TRIG 72.0 04/10/2020 0848   HDL 53.90 04/10/2020 0848   CHOLHDL 4 04/10/2020 0848   VLDL 14.4 04/10/2020 0848   LDLCALC 149 (H) 04/10/2020 0848   BP Readings from Last 3 Encounters:  04/10/20 138/90  12/21/19 (!) 168/73  11/16/19 (!) 159/90   Depression/Suicide: Depression screen PHQ 2/9 04/10/2020 06/23/2019 03/22/2019 04/21/2018 04/11/2017 01/12/2016 03/10/2015  Decreased Interest 0 0 0 0 0 0 1  Down, Depressed, Hopeless 0 0 0 0 0 0 0  PHQ - 2 Score 0 0 0 0 0 0 1  Altered sleeping 1 - - - - - 1  Tired, decreased energy 1 - - - - - 1  Change in appetite 0 - - - - - 0  Feeling bad or failure about yourself  0 - - - - - 0  Trouble concentrating 0 - - - - - 0  Moving slowly or fidgety/restless 0 - - - - - 0  Suicidal thoughts 0 - - - - - 0  PHQ-9 Score 2 - - - - - 3  Difficult doing work/chores Not difficult at all - - - - - Somewhat difficult   GAD 7 : Generalized Anxiety Score 04/10/2020  Nervous, Anxious, on Edge 0  Control/stop worrying 0  Worry too much - different things 0  Trouble relaxing 0  Restless 0  Easily annoyed or irritable 0  Afraid - awful might happen 0  Total GAD 7 Score 0   Vision:up to date  Dental:declined  Immunizations: (TDAP, Hep  C screen, Pneumovax, Influenza, zoster)  Health Maintenance  Topic Date Due  . COVID-19 Vaccine (3 - Booster for Pfizer series) 12/15/2019  . Flu Shot  05/11/2020*  . Mammogram  04/15/2021  . Tetanus Vaccine  04/12/2027  . Colon Cancer Screening  12/27/2027  .  Hepatitis C: One time screening is recommended by Center for Disease Control  (CDC) for  adults born from [birth year] through 1967.   Completed  . HIV Screening  Completed  . HPV Vaccine  Aged Out  *Topic was postponed. The date shown is not the original due date.   Diet:regular Exercise: none Weight:  Wt Readings from Last 3 Encounters:  04/10/20 245 lb 9.6 oz (111.4 kg)  12/21/19 237 lb (107.5 kg)  11/16/19 237 lb (107.5 kg)   Fall Risk: Fall Risk  06/23/2019 03/22/2019 04/21/2018 04/11/2017 01/12/2016  Falls in the past year? 0 0 0 No No  Number falls in past yr: 0 - - - -  Injury with Fall? 0 - - - -   Medications and allergies reviewed with patient and updated if appropriate.  Patient Active Problem List   Diagnosis Date Noted  . Non-intractable vomiting 10/25/2019  .  Left knee pain 01/18/2019  . 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 Elevated LDL cholesterol level 04/21/2018  . OSA (obstructive sleep apnea) 03/13/2016  . Hyperglycemia 01/12/2016  . Atrial fibrillation ([place]) 11/16/2015  . Esophageal reflux 10/16/2012  . 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 Class 3 severe obesity due to excess calories with serious comorbidity and body mass index (BMI) of 45.0 to 49.9 in adult ([place]) 12/20/2011  . HOT FLASHES 04/27/2010  . CHOLECYSTECTOMY, LAPAROSCOPIC, HX OF 08/09/2009  . Allergic rhinitis 04/26/2008  . Anemia 05/05/2007  . CONDYLOMA ACUMINATUM 04/15/2007  . Essential hypertension 07/03/2006    Current Outpatient Medications on File Prior to Visit  Medication Sig Dispense Refill  . Albuterol Sulfate (PROAIR RESPICLICK) 932 (90 Base) MCG/ACT AEPB Inhale 1 puff into the lungs every 6 (six) hours as needed. 1 each 0  .  amLODipine (NORVASC) 10 MG tablet Take 1 tablet (10 mg total) by mouth at bedtime. Resume if BP>150/90 30 tablet 5  . carvedilol (COREG) 25 MG tablet TAKE 1 TABLET BY MOUTH TWICE A DAY WITH MEALS 180 tablet 3  . cyclobenzaprine (FLEXERIL) 10 MG tablet Take 1 tablet (10 mg total) by mouth 3 (three) times daily as needed for muscle spasms. 30 tablet 0  . fluticasone (FLONASE) 50 MCG/ACT nasal spray PLACE 2 SPRAYS INTO BOTH NOSTRILS DAILY. 16 g 5  . MAGNESIUM PO Take by mouth.     . Multiple Vitamin (MULTIVITAMIN WITH MINERALS) TABS tablet Take 1 tablet by mouth at bedtime. ALIVE multi-vitamin    . naproxen (NAPROSYN) 500 MG tablet Take 1 tablet (500 mg total) by mouth 2 (two) times daily with a meal. 15 tablet 0  . olmesartan (BENICAR) 20 MG tablet Take 0.5 tablets (10 mg total) by mouth every morning. 45 tablet 1  . OVER THE COUNTER MEDICATION Herbal Tea daily     . diclofenac (VOLTAREN) 75 MG EC tablet Take 1 tablet (75 mg total) by mouth 2 (two) times daily. (Patient not taking: No sig reported) 30 tablet 0  . HYDROcodone-acetaminophen (NORCO) 5-325 MG tablet Take 1-2 tablets by mouth every 6 (six) hours as needed. (Patient not taking: No sig reported) 12 tablet 0   No current facility-administered medications on file prior to visit.    Past Medical History:  Diagnosis Date  . Allergy    allergic rhinitis  . Anemia 06/2007   C-scope 06/2007: tics, neg hemocults 07/2007  . Anxiety   . Blood transfusion without reported diagnosis    at birth  . Chest pain 12/2007   negative ECHO, neg stress test 06/2008  . Condyloma acuminatum    history of  . Hyperglycemia    borderline. A1c 5.7  12/2007  . Hypertension   . Personal history of atrial fibrillation    on carvedilol only   . Sleep apnea    wears cpap at night    Past Surgical History:  Procedure Laterality Date  . ABDOMINAL HYSTERECTOMY     no oophorectomy  . BREAST EXCISIONAL BIOPSY    . BREAST SURGERY  age 59   Fibroid tumor  removed right breast  . CARPAL TUNNEL RELEASE    . CESAREAN SECTION    . CHOLECYSTECTOMY    . COLONOSCOPY      Social History   Socioeconomic History  . Marital status: Divorced    Spouse name: Not on file  . Number of children: 3  . Years of education: Not on file  . Highest education level:  Not on file  Occupational History    Employer: [employer]  Tobacco Use  . Smoking status: Former Smoker    Packs/day: 0.50    Years: 27.00    Pack years: 13.50    Types: Cigarettes    Quit date: 02/12/2004    Years since quitting: 16.1  . Smokeless tobacco: Never Used  Substance and Sexual Activity  . Alcohol use: No  . Drug use: No  . Sexual activity: Not Currently    Birth control/protection: Post-menopausal, Surgical    Comment: s/p hysterectomy  Other Topics Concern  . 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 Breast cancer Paternal Aunt   . Cervical cancer Cousin   . Diabetes Mother   . Coronary artery disease Father   . Kidney disease Father   . Diabetes Father   . Hypertension Father   . Prostate cancer Father   . Coronary artery disease Sister   . Kidney disease Sister   . Diabetes Sister   . Hypertension Sister   . Kidney disease Brother   . Stroke Brother   . Hypertension Brother        x3  . Diabetes Maternal Grandmother   . Stroke Maternal Grandmother   . Diabetes Paternal Grandmother   . Stroke Paternal Grandmother   . Coronary artery disease Other         grandmother, uncle  . Kidney disease Other        grandmother  . Stroke Other        uncle  . Thyroid disease Other        aunts  . Glaucoma Other        grandmother  . Hypertension Other        grandmother  . Diabetes Other        aunts  . Hypothyroidism Other        aunt  . Hyperthyroidism Other        aunt  . Healthy Child   . Healthy Child   . Healthy Child   . Colon cancer Neg Hx   . Colon polyps Neg Hx   . Esophageal cancer Neg Hx   . Rectal cancer Neg Hx   . Stomach cancer Neg Hx         ROS  Objective:   Vitals:   04/10/20 0812  BP: 138/90  Pulse: 76  Temp: (!) 97.3 F (36.3 C)  SpO2: 98%    Body mass index is 44.92 kg/m.   Physical Examination:  Physical Exam Constitutional:      General: She is not in acute distress.    Appearance: She is obese.  HENT:     Right Ear: Tympanic membrane, ear canal and external ear normal.     Left Ear: Tympanic membrane, ear canal and external ear normal.     Mouth/Throat:     Mouth:  Oropharynx is clear and moist.  Eyes:     General: No scleral icterus.    Extraocular Movements: Extraocular movements intact and EOM normal.     Conjunctiva/sclera: Conjunctivae normal.  Neck:     Thyroid: No thyromegaly.  Cardiovascular:     Rate and Rhythm: Normal rate and regular rhythm.     Pulses: Normal pulses and intact distal pulses.     Heart sounds: Normal heart sounds.  Pulmonary:     Effort: Pulmonary effort is normal.     Breath sounds: Normal breath sounds.  Chest:     Chest wall: No tenderness.  Abdominal:     General: Bowel sounds are normal. There is no distension.     Palpations: Abdomen is soft.     Tenderness: There is no abdominal tenderness.  Musculoskeletal:        General: No tenderness. Normal range of motion.     Cervical back: Normal range of motion and neck supple.     Right lower leg: Edema present.     Left lower leg: Edema present.  Lymphadenopathy:     Cervical: No cervical  adenopathy.  Skin:    General: Skin is warm and dry.  Neurological:     Mental Status: She is alert and oriented to person, place, and time.  Psychiatric:        Mood and Affect: Mood normal.        Behavior: Behavior normal.        Thought Content: Thought content normal.    ASSESSMENT and PLAN: This visit occurred during the SARS-CoV-2 public health emergency.  Safety protocols were in place, including screening questions prior to the visit, additional usage of staff PPE, and extensive cleaning of exam room while observing appropriate contact time as indicated for disinfecting solutions.   Jennifer [name] was seen today for annual exam.  Diagnoses and all orders for this visit:  Encounter for preventative adult health care exam with abnormal findings -     CBC -     Comprehensive metabolic panel  Essential hypertension  Elevated LDL cholesterol level -     Lipid panel  OSA (obstructive sleep apnea) -     For home use only DME continuous positive airway pressure (CPAP)  Hyperglycemia -     Hemoglobin A1c  Class 3 severe obesity due to excess calories with serious comorbidity and body mass index (BMI) of 40.0 to 44.9 in adult [facility])  I faxed DME order to [facility]. Call their office if you do not hear anything in 1week. Schedule f/up appt with [facility]. Your last appt was 2019. Schedule appt for annual mammogram (due 04/16/2019) and dental cleaning. Maintain DASH diet, decrease calorie intake and daily exercise.    Problem List Items Addressed This Visit      Cardiovascular and Mediastinum   Essential hypertension    BP at goal BP Readings from Last 3 Encounters:  04/10/20 138/90  12/21/19 (!) 168/73  11/16/19 (!) 159/90   continue amlodipine, coreg, and benicar Stable renal function        Respiratory   OSA (obstructive sleep apnea)    Needs order for new CPAP supplies She has not use machine in over 1year DME order faxed Advised about the importance of  CPAP compliance      Relevant Orders   For home use only DME continuous positive airway pressure (CPAP)     Other   Class 3 severe obesity due to excess calories with  serious comorbidity and body mass index (BMI) of 45.0 to 49.9 in adult [facility])   Elevated LDL cholesterol level    ASCVD risk of 9.7%  recommended pravastatin Lipid Panel     Component Value Date/Time   CHOL 217 (H) 04/10/2020 0848   TRIG 72.0 04/10/2020 0848   HDL 53.90 04/10/2020 0848   CHOLHDL 4 04/10/2020 0848   VLDL 14.4 04/10/2020 0848   LDLCALC 149 (H) 04/10/2020 0848        Relevant Orders   Lipid panel (Completed)   Hyperglycemia    Stable hgbA1c at 5.8      Relevant Orders   Hemoglobin A1c (Completed)    Other Visit Diagnoses    Encounter for preventative adult health care exam with abnormal findings    -  Primary   Relevant Orders   CBC (Completed)   Comprehensive metabolic panel (Completed)      Follow up: Return in about 6 months (around 10/08/2020) for HTN and , hyperlipidemia (fasting).  Wilfred Lacy, NP

## 2020-04-10 NOTE — Patient Instructions (Addendum)
I faxed DME order to [facility]. Call their office if you do not hear anything in 1week.  Schedule f/up appt with [facility]. Your last appt was 2019.  Schedule appt for annual mammogram (due 04/16/2019) and dental cleaning.  Maintain DASH diet, decrease calorie intake and daily exercise.  Go to lab for blood draw.  Health Maintenance, Female Adopting a healthy lifestyle and getting preventive care are important in promoting health and wellness. Ask your health care provider about:  The right schedule for you to have regular tests and exams.  Things you can do on your own to prevent diseases and keep yourself healthy. What should I know about diet, weight, and exercise? Eat a healthy diet  Eat a diet that includes plenty of vegetables, fruits, low-fat dairy products, and lean protein.  Do not eat a lot of foods that are high in solid fats, added sugars, or sodium.   Maintain a healthy weight Body mass index (BMI) is used to identify weight problems. It estimates body fat based on height and weight. Your health care provider can help determine your BMI and help you achieve or maintain a healthy weight. Get regular exercise Get regular exercise. This is one of the most important things you can do for your health. Most adults should:  Exercise for at least 150 minutes each week. The exercise should increase your heart rate and make you sweat (moderate-intensity exercise).  Do strengthening exercises at least twice a week. This is in addition to the moderate-intensity exercise.  Spend less time sitting. Even light physical activity can be beneficial. Watch cholesterol and blood lipids Have your blood tested for lipids and cholesterol at 62 years of age, then have this test every 5 years. Have your cholesterol levels checked more often if:  Your lipid or cholesterol levels are high.  You are older than 62 years of age.  You are at high risk for heart disease. What should I  know about cancer screening? Depending on your health history and family history, you may need to have cancer screening at various ages. This may include screening for:  Breast cancer.  Cervical cancer.  Colorectal cancer.  Skin cancer.  Lung cancer. What should I know about heart disease, diabetes, and high blood pressure? Blood pressure and heart disease  High blood pressure causes heart disease and increases the risk of stroke. This is more likely to develop in people who have high blood pressure readings, are of African descent, or are overweight.  Have your blood pressure checked: ? Every 3-5 years if you are 80-69 years of age. ? Every year if you are 62 years old or older. Diabetes Have regular diabetes screenings. This checks your fasting blood sugar level. Have the screening done:  Once every three years after age 57 if you are at a normal weight and have a low risk for diabetes.  More often and at a younger age if you are overweight or have a high risk for diabetes. What should I know about preventing infection? Hepatitis B If you have a higher risk for hepatitis B, you should be screened for this virus. Talk with your health care provider to find out if you are at risk for hepatitis B infection. Hepatitis C Testing is recommended for:  Everyone born from [birth year] through 1967.  Anyone with known risk factors for hepatitis C. Sexually transmitted infections (STIs)  Get screened for STIs, including gonorrhea and chlamydia, if: ? You are sexually active and are  younger than 62 years of age. ? You are older than 62 years of age and your health care provider tells you that you are at risk for this type of infection. ? Your sexual activity has changed since you were last screened, and you are at increased risk for chlamydia or gonorrhea. Ask your health care provider if you are at risk.  Ask your health care provider about whether you are at high risk for HIV. Your health  care provider may recommend a prescription medicine to help prevent HIV infection. If you choose to take medicine to prevent HIV, you should first get tested for HIV. You should then be tested every 3 months for as long as you are taking the medicine. Pregnancy  If you are about to stop having your period (premenopausal) and you may become pregnant, seek counseling before you get pregnant.  Take 400 to 800 micrograms (mcg) of folic acid every day if you become pregnant.  Ask for birth control (contraception) if you want to prevent pregnancy. Osteoporosis and menopause Osteoporosis is a disease in which the bones lose minerals and strength with aging. This can result in bone fractures. If you are 15 years old or older, or if you are at risk for osteoporosis and fractures, ask your health care provider if you should:  Be screened for bone loss.  Take a calcium or vitamin D supplement to lower your risk of fractures.  Be given hormone replacement therapy (HRT) to treat symptoms of menopause. Follow these instructions at home: Lifestyle  Do not use any products that contain nicotine or tobacco, such as cigarettes, e-cigarettes, and chewing tobacco. If you need help quitting, ask your health care provider.  Do not use street drugs.  Do not share needles.  Ask your health care provider for help if you need support or information about quitting drugs. Alcohol use  Do not drink alcohol if: ? Your health care provider tells you not to drink. ? You are pregnant, may be pregnant, or are planning to become pregnant.  If you drink alcohol: ? Limit how much you use to 0-1 drink a day. ? Limit intake if you are breastfeeding.  Be aware of how much alcohol is in your drink. In the [place] one drink equals one 12 oz bottle of beer (355 mL), one 5 oz glass of wine (148 mL), or one 1 oz glass of hard liquor (44 mL). General instructions  Schedule regular health, dental, and eye exams.  Stay  current with your vaccines.  Tell your health care provider if: ? You often feel depressed. ? You have ever been abused or do not feel safe at home. Summary  Adopting a healthy lifestyle and getting preventive care are important in promoting health and wellness.  Follow your health care provider's instructions about healthy diet, exercising, and getting tested or screened for diseases.  Follow your health care provider's instructions on monitoring your cholesterol and blood pressure. This information is not intended to replace advice given to you by your health care provider. Make sure you discuss any questions you have with your health care provider. Document Revised: 01/21/2018 Document Reviewed: 01/21/2018 Elsevier Patient Education  2021 [employer].

## 2020-04-11 ENCOUNTER — Encounter: Payer: Self-pay | Admitting: Nurse Practitioner

## 2020-04-11 MED FILL — AMLODIPINE BESYLATE 10 MG T: 10 | 30 days supply | Qty: 30 | Fill #2

## 2020-04-11 MED FILL — CARVEDILOL 25 MG TABS: 25 | 30 days supply | Qty: 60 | Fill #7

## 2020-04-11 NOTE — Assessment & Plan Note (Signed)
ASCVD risk of 9.7%  recommended pravastatin Lipid Panel     Component Value Date/Time   CHOL 217 (H) 04/10/2020 0848   TRIG 72.0 04/10/2020 0848   HDL 53.90 04/10/2020 0848   CHOLHDL 4 04/10/2020 0848   VLDL 14.4 04/10/2020 0848   LDLCALC 149 (H) 04/10/2020 0848

## 2020-04-11 NOTE — Assessment & Plan Note (Signed)
Stable hgbA1c at 5.8

## 2020-04-11 NOTE — Assessment & Plan Note (Signed)
Needs order for new CPAP supplies She has not use machine in over 1year DME order faxed Advised about the importance of CPAP compliance

## 2020-04-11 NOTE — Assessment & Plan Note (Signed)
BP at goal BP Readings from Last 3 Encounters:  04/10/20 138/90  12/21/19 (!) 168/73  11/16/19 (!) 159/90   continue amlodipine, coreg, and benicar Stable renal function

## 2020-04-25 ENCOUNTER — Emergency Department (HOSPITAL_COMMUNITY)
Admission: EM | Admit: 2020-04-25 | Discharge: 2020-04-25 | Disposition: A | Discharge status: Home / Self Care | Payer: Managed Care, Other (non HMO) | Attending: Emergency Medicine | Admitting: Emergency Medicine

## 2020-04-25 ENCOUNTER — Other Ambulatory Visit: Payer: Self-pay

## 2020-04-25 ENCOUNTER — Emergency Department (HOSPITAL_COMMUNITY): Payer: Managed Care, Other (non HMO)

## 2020-04-25 ENCOUNTER — Encounter (HOSPITAL_COMMUNITY): Payer: Self-pay | Admitting: Emergency Medicine

## 2020-04-25 DIAGNOSIS — I10 Essential (primary) hypertension: Secondary | ICD-10-CM | POA: Insufficient documentation

## 2020-04-25 DIAGNOSIS — Z79899 Other long term (current) drug therapy: Secondary | ICD-10-CM | POA: Diagnosis not present

## 2020-04-25 DIAGNOSIS — Z87891 Personal history of nicotine dependence: Secondary | ICD-10-CM | POA: Insufficient documentation

## 2020-04-25 DIAGNOSIS — R0789 Other chest pain: Secondary | ICD-10-CM

## 2020-04-25 LAB — CBC
HCT: 38.5 % (ref 36.0–46.0)
Hemoglobin: 12.5 g/dL (ref 12.0–15.0)
MCH: 29.6 pg (ref 26.0–34.0)
MCHC: 32.5 g/dL (ref 30.0–36.0)
MCV: 91 fL (ref 80.0–100.0)
Platelets: 298 10*3/uL (ref 150–400)
RBC: 4.23 MIL/uL (ref 3.87–5.11)
RDW: 13.2 % (ref 11.5–15.5)
WBC: 5.1 10*3/uL (ref 4.0–10.5)
nRBC: 0 % (ref 0.0–0.2)

## 2020-04-25 LAB — BASIC METABOLIC PANEL
Anion gap: 7 (ref 5–15)
BUN: 11 mg/dL (ref 8–23)
CO2: 24 mmol/L (ref 22–32)
Calcium: 8.9 mg/dL (ref 8.9–10.3)
Chloride: 105 mmol/L (ref 98–111)
Creatinine, Ser: 0.76 mg/dL (ref 0.44–1.00)
GFR, Estimated: >60 mL/min (ref >60)
Glucose, Bld: 119 mg/dL — ABNORMAL HIGH (ref 70–99)
Potassium: 3.3 mmol/L — ABNORMAL LOW (ref 3.5–5.1)
Sodium: 136 mmol/L (ref 135–145)

## 2020-04-25 LAB — TROPONIN I (HIGH SENSITIVITY)
Troponin I (High Sensitivity): 3 ng/L (ref <18)
Troponin I (High Sensitivity): 3 ng/L (ref <18)

## 2020-04-25 LAB — PROTIME-INR
INR: 1.1 (ref 0.8–1.2)
Prothrombin Time: 13.5 seconds (ref 11.4–15.2)

## 2020-04-25 LAB — D-DIMER, QUANTITATIVE: D-Dimer, Quant: 1.14 ug/mL-FEU — ABNORMAL HIGH (ref 0.00–0.50)

## 2020-04-25 MED ORDER — IOHEXOL 350 MG/ML SOLN
80.0000 mL | Freq: Once | INTRAVENOUS | Status: AC | PRN
  Administered 2020-04-25: 80 mL via INTRAVENOUS

## 2020-04-25 NOTE — ED Notes (Signed)
PT transported to CT>

## 2020-04-25 NOTE — ED Notes (Signed)
Pt transported to xray 

## 2020-04-25 NOTE — Discharge Instructions (Addendum)
Follow-up closely with local heart doctor to discuss stress test and further evaluation.  Return for chest pain that reoccurs, persist, associate with exertion or new concerns.Have your doctor order an ultrasound of liver area in the next few weeks for more details of densities noted on CT.

## 2020-04-25 NOTE — ED Notes (Signed)
Patient verbalizes understanding of discharge instructions. Opportunity for questioning and answers were provided. Armband removed by staff, pt discharged from ED and ambulated to lobby to return home.   

## 2020-04-25 NOTE — ED Provider Notes (Signed)
[facility] EMERGENCY DEPARTMENT Provider Note   CSN: [id number] Arrival date & time: 04/25/20  0654     History Chief Complaint  Patient presents with  . Chest Pain    Jennifer [name] is a 60 y.o. female.  Patient with history of anemia, anxiety, obesity, high blood pressure, atrial fibrillation, negative stress test 2010 presents with intermittent sharp chest pain lasting seconds throughout the week.  Last episode this morning at 6:00.  No exertional component, no diaphoresis, no vomiting.  No history of heart attack or heart failure.  No fevers or chills.  No current shortness of breath.  Patient denies risk factors for blood clots.  Location mid lower sternum and sharp.  Father had heart disease however lived to late 36s no heart attack at a young age.        Past Medical History:  Diagnosis Date  . Allergy    allergic rhinitis  . Anemia 06/2007   C-scope 06/2007: tics, neg hemocults 07/2007  . Anxiety   . Blood transfusion without reported diagnosis    at birth  . Chest pain 12/2007   negative ECHO, neg stress test 06/2008  . Condyloma acuminatum    history of  . Hyperglycemia    borderline. A1c 5.7  12/2007  . Hypertension   . Personal history of atrial fibrillation    on carvedilol only   . Sleep apnea    wears cpap at night    Patient Active Problem List   Diagnosis Date Noted  . Non-intractable vomiting 10/25/2019  . Left knee pain 01/18/2019  . 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 Elevated LDL cholesterol level 04/21/2018  . OSA (obstructive sleep apnea) 03/13/2016  . Hyperglycemia 01/12/2016  . Atrial fibrillation ([place]) 11/16/2015  . Esophageal reflux 10/16/2012  . 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 Class 3 severe obesity due to excess calories with serious comorbidity and body mass index (BMI) of 45.0 to 49.9 in adult ([place]) 12/20/2011  . HOT FLASHES 04/27/2010  . CHOLECYSTECTOMY, LAPAROSCOPIC, HX OF 08/09/2009  . Allergic  rhinitis 04/26/2008  . Anemia 05/05/2007  . CONDYLOMA ACUMINATUM 04/15/2007  . Essential hypertension 07/03/2006    Past Surgical History:  Procedure Laterality Date  . ABDOMINAL HYSTERECTOMY     no oophorectomy  . BREAST EXCISIONAL BIOPSY    . BREAST SURGERY  age 89   Fibroid tumor removed right breast  . CARPAL TUNNEL RELEASE    . CESAREAN SECTION    . CHOLECYSTECTOMY    . COLONOSCOPY       OB History   No obstetric history on file.     Family History  Problem Relation Age of Onset  . Breast cancer Paternal Aunt   . Cervical cancer Cousin   . Diabetes Mother   . Coronary artery disease Father   . Kidney disease Father   . Diabetes Father   . Hypertension Father   . Prostate cancer Father   . Coronary artery disease Sister   . Kidney disease Sister   . Diabetes Sister   . Hypertension Sister   . Kidney disease Brother   . Stroke Brother   . Hypertension Brother        x3  . Diabetes Maternal Grandmother   . Stroke Maternal Grandmother   . Diabetes Paternal Grandmother   . Stroke Paternal Grandmother   . Coronary artery disease Other        grandmother, uncle  . Kidney disease Other  grandmother  . Stroke Other        uncle  . Thyroid disease Other        aunts  . Glaucoma Other        grandmother  . Hypertension Other        grandmother  . Diabetes Other        aunts  . Hypothyroidism Other        aunt  . Hyperthyroidism Other        aunt  . Healthy Child   . Healthy Child   . Healthy Child   . Colon cancer Neg Hx   . Colon polyps Neg Hx   . Esophageal cancer Neg Hx   . Rectal cancer Neg Hx   . Stomach cancer Neg Hx     Social History   Tobacco Use  . Smoking status: Former Smoker    Packs/day: 0.50    Years: 27.00    Pack years: 13.50    Types: Cigarettes    Quit date: 02/12/2004    Years since quitting: 16.2  . Smokeless tobacco: Never Used  Substance Use Topics  . Alcohol use: No  . Drug use: No    Home  Medications Prior to Admission medications   Medication Sig Start Date End Date Taking? Authorizing Provider  Albuterol Sulfate (PROAIR RESPICLICK) 409 (90 Base) MCG/ACT AEPB Inhale 1 puff into the lungs every 6 (six) hours as needed. 01/26/18   Nche, Charlene Brooke, NP  amLODipine (NORVASC) 10 MG tablet Take 1 tablet (10 mg total) by mouth at bedtime. Resume if BP>150/90 06/23/19   Nche, Charlene Brooke, NP  carvedilol (COREG) 25 MG tablet TAKE 1 TABLET BY MOUTH TWICE A DAY WITH MEALS 06/23/19   Nche, Charlene Brooke, NP  cyclobenzaprine (FLEXERIL) 10 MG tablet Take 1 tablet (10 mg total) by mouth 3 (three) times daily as needed for muscle spasms. 10/20/19   Dagoberto Ligas, MD  diclofenac (VOLTAREN) 75 MG EC tablet Take 1 tablet (75 mg total) by mouth 2 (two) times daily. Patient not taking: No sig reported 08/31/19   Dutch Quint B, FNP  fluticasone (FLONASE) 50 MCG/ACT nasal spray PLACE 2 SPRAYS INTO BOTH NOSTRILS DAILY. 08/13/19   Nche, Charlene Brooke, NP  HYDROcodone-acetaminophen (NORCO) 5-325 MG tablet Take 1-2 tablets by mouth every 6 (six) hours as needed. Patient not taking: No sig reported 10/21/19   Veryl Speak, MD  MAGNESIUM PO Take by mouth.     [provider]  Multiple Vitamin (MULTIVITAMIN WITH MINERALS) TABS tablet Take 1 tablet by mouth at bedtime. ALIVE multi-vitamin    [provider]  naproxen (NAPROSYN) 500 MG tablet Take 1 tablet (500 mg total) by mouth 2 (two) times daily with a meal. 10/21/19   Veryl Speak, MD  olmesartan (BENICAR) 20 MG tablet Take 0.5 tablets (10 mg total) by mouth every morning. 03/22/19   Nche, Charlene Brooke, NP  OVER THE COUNTER MEDICATION Herbal Tea daily     [provider]    Allergies    Ace inhibitors and Codeine  Review of Systems   Review of Systems  Constitutional: Negative for chills and fever.  HENT: Negative for congestion.   Eyes: Negative for visual disturbance.  Respiratory: Positive for shortness of breath.    Cardiovascular: Positive for chest pain. Negative for leg swelling.  Gastrointestinal: Negative for abdominal pain and vomiting.  Genitourinary: Negative for dysuria and flank pain.  Musculoskeletal: Negative for back pain, neck pain and neck  stiffness.  Skin: Negative for rash.  Neurological: Negative for light-headedness and headaches.    Physical Exam Updated Vital Signs BP (!) 147/92 (BP Location: Right Arm)   Pulse 73   Temp 98 F (36.7 C) (Oral)   Resp 18   Ht 5\' 2"  (1.575 m)   Wt 120 kg   SpO2 98%   BMI 48.39 kg/m   Physical Exam Vitals and nursing note reviewed.  Constitutional:      Appearance: She is well-developed.  HENT:     Head: Normocephalic and atraumatic.  Eyes:     General:        Right eye: No discharge.        Left eye: No discharge.     Conjunctiva/sclera: Conjunctivae normal.  Neck:     Trachea: No tracheal deviation.  Cardiovascular:     Rate and Rhythm: Normal rate and regular rhythm.     Heart sounds:   No systolic murmur is present.   Pulmonary:     Effort: Pulmonary effort is normal.     Breath sounds: Normal breath sounds.  Abdominal:     General: There is no distension.     Palpations: Abdomen is soft.     Tenderness: There is no abdominal tenderness. There is no guarding.  Musculoskeletal:     Cervical back: Normal range of motion and neck supple.     Right lower leg: No tenderness. No edema.     Left lower leg: No tenderness. No edema.  Skin:    General: Skin is warm.     Findings: No rash.  Neurological:     Mental Status: She is alert and oriented to person, place, and time.     ED Results / Procedures / Treatments   Labs (all labs ordered are listed, but only abnormal results are displayed) Labs Reviewed  BASIC METABOLIC PANEL - Abnormal; Notable for the following components:      Result Value   Potassium 3.3 (*)    Glucose, Bld 119 (*)    All other components within normal limits  D-DIMER, QUANTITATIVE -  Abnormal; Notable for the following components:   D-Dimer, Quant 1.14 (*)    All other components within normal limits  CBC  PROTIME-INR  TROPONIN I (HIGH SENSITIVITY)  TROPONIN I (HIGH SENSITIVITY)    EKG EKG Interpretation  Date/Time:  Tuesday April 25 2020 06:59:25 EDT Ventricular Rate:  86 PR Interval:  190 QRS Duration: 68 QT Interval:  360 QTC Calculation: 430 R Axis:   57 Text Interpretation: Normal sinus rhythm Low voltage QRS Cannot rule out Anterior infarct , age undetermined Abnormal ECG overall similar previous Reconfirmed by Elnora Morrison [phone]) on 04/25/2020 7:51:18 AM Also confirmed by Elnora Morrison [phone]), editor Lynder Parents [phone])  on 04/25/2020 11:11:17 AM   Radiology DG Chest 2 View  Result Date: 04/25/2020 CLINICAL DATA:  Chest pain EXAM: CHEST - 2 VIEW COMPARISON:  10/08/2015 FINDINGS: Enlarged appearance of the heart on the frontal view which is stable; heart size is normal appearing on the lateral view. Stable aortic tortuosity. There is no edema, consolidation, effusion, or pneumothorax. Cholecystectomy clips. Artifact from EKG leads IMPRESSION: Stable exam.  No acute finding. Electronically Signed   By: Monte Fantasia M.D.   On: 04/25/2020 07:37   CT Angio Chest PE W and/or Wo Contrast  Result Date: 04/25/2020 CLINICAL DATA:  Chest pain, elevated D-dimer, shortness of breath EXAM: CT ANGIOGRAPHY CHEST WITH CONTRAST TECHNIQUE: Multidetector CT imaging of  the chest was performed using the standard protocol during bolus administration of intravenous contrast. Multiplanar CT image reconstructions and MIPs were obtained to evaluate the vascular anatomy. CONTRAST:  40mL OMNIPAQUE IOHEXOL 350 MG/ML SOLN COMPARISON:  None. FINDINGS: Cardiovascular: Mild cardiomegaly. Aorta normal caliber. Scattered coronary artery and aortic calcifications. No filling defects in the pulmonary arteries to suggest pulmonary emboli. Mediastinum/Nodes: No mediastinal, hilar, or  axillary adenopathy. Trachea and esophagus are unremarkable. Thyroid unremarkable. Lungs/Pleura: No confluent opacities or effusions. Upper Abdomen: Small low-density lesions in the liver, the largest 15 mm in the left hepatic lobe. These cannot be characterized. Prior cholecystectomy. Small hiatal hernia. Musculoskeletal: Chest wall soft tissues are unremarkable. No acute bony abnormality. Review of the MIP images confirms the above findings. IMPRESSION: No evidence of pulmonary embolus. Mild cardiomegaly, coronary artery disease. Small hiatal hernia. Scattered hypodensities in the visualized liver which likely reflects cysts but cannot be fully characterized on this study. These could be further characterized with non emergent right upper quadrant ultrasound. Aortic Atherosclerosis (ICD10-I70.0). Electronically Signed   By: Rolm Baptise M.D.   On: 04/25/2020 11:10    Procedures Ultrasound ED Peripheral IV (Provider)  Date/Time: 04/25/2020 10:21 AM Performed by: Elnora Morrison, MD Authorized by: Elnora Morrison, MD   Procedure details:    Indications: multiple failed IV attempts     Skin Prep: chlorhexidine gluconate     Location:  Right AC   Angiocath:  18 G   Bedside Ultrasound Guided: Yes     Images: archived     Patient tolerated procedure without complications: Yes       Medications Ordered in ED Medications  iohexol (OMNIPAQUE) 350 MG/ML injection 80 mL (80 mLs Intravenous Contrast Given 04/25/20 1107)    ED Course  I have reviewed the triage vital signs and the nursing notes.  Pertinent labs & imaging results that were available during my care of the patient were reviewed by me and considered in my medical decision making (see chart for details).    MDM Rules/Calculators/A&P                          Patient presents with intermittent sharp chest pain, currently chest pain-free, vital signs normal in the room.  Patient does have risk factors for CAD including obesity, high  blood pressure and she is now on her 38s.  Plan for delta troponin and if patient remains chest pain-free we will have her follow-up urgently for another stress test with cardiology.  Patient symptoms are atypical however with risk factors I do feel she needs further evaluation.  Other differentials include atypical pulmonary embolism with mild shortness of breath and sharp pain D-dimer ordered as she is low risk, reflux, musculoskeletal, other.  Blood work reviewed showing negative troponin, normal hemoglobin, normal electrolytes, normal kidney function.  Patient chest pain-free no symptoms on reassessment.  Difficult IV, ultrasound-guided by myself, D-dimer mild positive patient low risk requiring CT scan for further delineation.  Updated patient on plan of care.  Discussed with nursing.   CT scan reviewed no acute blood clot.  Discussed outpatient follow-up for ultrasound for possible cysts.  Patient well-appearing no symptoms on reassessment.  Troponin negative.  Final Clinical Impression(s) / ED Diagnoses Final diagnoses:  Atypical chest pain    Rx / DC Orders ED Discharge Orders    None       Elnora Morrison, MD 04/26/20 1535

## 2020-04-25 NOTE — ED Triage Notes (Signed)
Patient reports intermittent mid chest pain with mild SOB onset last week , no emesis or diaphoresis , denies cough or fever .

## 2020-04-25 NOTE — ED Notes (Signed)
Patient transported to Ultrasound 

## 2020-05-16 ENCOUNTER — Other Ambulatory Visit (HOSPITAL_COMMUNITY): Payer: Self-pay

## 2020-05-16 MED FILL — Carvedilol Tab 25 MG: ORAL | 30 days supply | Qty: 60 | Fill #0 | Status: AC

## 2020-05-30 ENCOUNTER — Other Ambulatory Visit (HOSPITAL_BASED_OUTPATIENT_CLINIC_OR_DEPARTMENT_OTHER): Payer: Self-pay | Admitting: Nurse Practitioner

## 2020-05-30 DIAGNOSIS — Z1231 Encounter for screening mammogram for malignant neoplasm of breast: Secondary | ICD-10-CM

## 2020-06-09 ENCOUNTER — Ambulatory Visit (HOSPITAL_BASED_OUTPATIENT_CLINIC_OR_DEPARTMENT_OTHER): Payer: Managed Care, Other (non HMO)

## 2020-06-10 ENCOUNTER — Ambulatory Visit (HOSPITAL_BASED_OUTPATIENT_CLINIC_OR_DEPARTMENT_OTHER): Payer: Managed Care, Other (non HMO)

## 2020-06-12 ENCOUNTER — Inpatient Hospital Stay (HOSPITAL_BASED_OUTPATIENT_CLINIC_OR_DEPARTMENT_OTHER): Admission: RE | Admit: 2020-06-12 | Payer: Managed Care, Other (non HMO) | Source: Ambulatory Visit

## 2020-06-23 ENCOUNTER — Other Ambulatory Visit: Payer: Self-pay | Admitting: Nurse Practitioner

## 2020-06-23 ENCOUNTER — Other Ambulatory Visit (HOSPITAL_COMMUNITY): Payer: Self-pay

## 2020-06-23 DIAGNOSIS — I10 Essential (primary) hypertension: Secondary | ICD-10-CM

## 2020-06-23 MED ORDER — CARVEDILOL 25 MG PO TABS
ORAL_TABLET | Freq: Two times a day (BID) | ORAL | 3 refills | Status: DC
  Filled 2020-06-23: qty 60, 30d supply, fill #0
  Filled 2020-07-31: qty 60, 30d supply, fill #1
  Filled 2020-09-11 – 2020-09-12 (×2): qty 60, 30d supply, fill #2
  Filled 2020-10-19: qty 60, 30d supply, fill #3
  Filled 2020-11-22: qty 60, 30d supply, fill #4
  Filled 2020-12-29: qty 60, 30d supply, fill #5
  Filled 2021-02-06: qty 60, 30d supply, fill #6
  Filled 2021-03-12: qty 60, 30d supply, fill #7
  Filled 2021-05-01: qty 60, 30d supply, fill #8
  Filled 2021-06-04: qty 60, 30d supply, fill #9

## 2020-07-12 ENCOUNTER — Ambulatory Visit (HOSPITAL_BASED_OUTPATIENT_CLINIC_OR_DEPARTMENT_OTHER): Payer: Managed Care, Other (non HMO)

## 2020-07-31 ENCOUNTER — Other Ambulatory Visit (HOSPITAL_COMMUNITY): Payer: Self-pay

## 2020-09-12 ENCOUNTER — Other Ambulatory Visit (HOSPITAL_COMMUNITY): Payer: Self-pay

## 2020-10-09 ENCOUNTER — Other Ambulatory Visit (HOSPITAL_COMMUNITY): Payer: Self-pay

## 2020-10-09 ENCOUNTER — Encounter: Payer: Self-pay | Admitting: Nurse Practitioner

## 2020-10-09 ENCOUNTER — Other Ambulatory Visit: Payer: Self-pay

## 2020-10-09 ENCOUNTER — Ambulatory Visit (INDEPENDENT_AMBULATORY_CARE_PROVIDER_SITE_OTHER): Payer: Managed Care, Other (non HMO) | Admitting: Nurse Practitioner

## 2020-10-09 VITALS — BP 150/90 | HR 64 | Temp 96.9°F | Ht 62.0 in | Wt 250.8 lb

## 2020-10-09 DIAGNOSIS — R739 Hyperglycemia, unspecified: Secondary | ICD-10-CM

## 2020-10-09 DIAGNOSIS — I10 Essential (primary) hypertension: Secondary | ICD-10-CM | POA: Diagnosis not present

## 2020-10-09 DIAGNOSIS — Z6841 Body Mass Index (BMI) 40.0 and over, adult: Secondary | ICD-10-CM | POA: Diagnosis not present

## 2020-10-09 DIAGNOSIS — E7800 Pure hypercholesterolemia, unspecified: Secondary | ICD-10-CM | POA: Diagnosis not present

## 2020-10-09 LAB — LIPID PANEL
Cholesterol: 197 mg/dL (ref 0–200)
HDL: 49.4 mg/dL (ref >39.00)
LDL Cholesterol: 133 mg/dL — ABNORMAL HIGH (ref 0–99)
NonHDL: 147.41
Total CHOL/HDL Ratio: 4
Triglycerides: 72 mg/dL (ref 0.0–149.0)
VLDL: 14.4 mg/dL (ref 0.0–40.0)

## 2020-10-09 LAB — BASIC METABOLIC PANEL
BUN: 10 mg/dL (ref 6–23)
CO2: 25 mEq/L (ref 19–32)
Calcium: 9 mg/dL (ref 8.4–10.5)
Chloride: 104 mEq/L (ref 96–112)
Creatinine, Ser: 0.74 mg/dL (ref 0.40–1.20)
GFR: 87.08 mL/min (ref >60.00)
Glucose, Bld: 95 mg/dL (ref 70–99)
Potassium: 3.6 mEq/L (ref 3.5–5.1)
Sodium: 139 mEq/L (ref 135–145)

## 2020-10-09 LAB — HEMOGLOBIN A1C: Hgb A1c MFr Bld: 5.8 % (ref 4.6–6.5)

## 2020-10-09 LAB — TSH: TSH: 1.73 u[IU]/mL (ref 0.35–5.50)

## 2020-10-09 MED ORDER — AMLODIPINE BESYLATE 10 MG PO TABS
10.0000 mg | ORAL_TABLET | Freq: Every day | ORAL | 3 refills | Status: DC
  Filled 2020-10-09: qty 30, 30d supply, fill #0

## 2020-10-09 MED ORDER — OLMESARTAN MEDOXOMIL 20 MG PO TABS
10.0000 mg | ORAL_TABLET | Freq: Every morning | ORAL | 3 refills | Status: DC
  Filled 2020-10-09: qty 15, 30d supply, fill #0

## 2020-10-09 NOTE — Assessment & Plan Note (Signed)
Elevated BP with persistent LE edema. Reports compliance with medications Admits she is not compliant with DASH diet BP Readings from Last 3 Encounters:  10/09/20 (!) 150/90  04/25/20 (!) 147/92  04/10/20 138/90   Med refill sent: amlodipine, benicar and coreg. Repeat TSH and BMP Advised about importance of DASH diet, exercise and use of compressions stocking. She is to send BP reading via mychart in 4days.

## 2020-10-09 NOTE — Progress Notes (Signed)
Subjective:  Patient ID: Jennifer [name], female    DOB: Apr 14, 1960  Age: 60 y.o. MRN: [id number]  CC: Follow-up (6 month f/u on HTN and hyperlipidemia. Pt is fasting. Does not check BP at home. /Declines flu vaccine//)  HPI  Essential hypertension Elevated BP with persistent LE edema. Reports compliance with medications Admits she is not compliant with DASH diet BP Readings from Last 3 Encounters:  10/09/20 (!) 150/90  04/25/20 (!) 147/92  04/10/20 138/90   Med refill sent: amlodipine, benicar and coreg. Repeat TSH and BMP Advised about importance of DASH diet, exercise and use of compressions stocking. She is to send BP reading via mychart in 4days.  Hyperglycemia repeat HgbA1c Has changed diet to low carb/low sugar diet Lost 13lbs in last 15month Wt Readings from Last 3 Encounters:  10/09/20 250 lb 12.8 oz (113.8 kg)  04/25/20 264 lb 8.8 oz (120 kg)  04/10/20 245 lb 9.6 oz (111.4 kg)     Reviewed past Medical, Social and Family history today.  Outpatient Medications Prior to Visit  Medication Sig Dispense Refill   Albuterol Sulfate (PROAIR RESPICLICK) 1123XX123(90 Base) MCG/ACT AEPB Inhale 1 puff into the lungs every 6 (six) hours as needed. 1 each 0   carvedilol (COREG) 25 MG tablet TAKE 1 TABLET BY MOUTH TWICE A DAY WITH MEALS 180 tablet 3   cyclobenzaprine (FLEXERIL) 10 MG tablet Take 1 tablet (10 mg total) by mouth 3 (three) times daily as needed for muscle spasms. 30 tablet 0   MAGNESIUM PO Take by mouth.      Multiple Vitamin (MULTIVITAMIN WITH MINERALS) TABS tablet Take 1 tablet by mouth at bedtime. ALIVE multi-vitamin     OVER THE COUNTER MEDICATION Herbal Tea daily      olmesartan (BENICAR) 20 MG tablet Take 0.5 tablets (10 mg total) by mouth every morning. 45 tablet 1   fluticasone (FLONASE) 50 MCG/ACT nasal spray PLACE 2 SPRAYS INTO BOTH NOSTRILS DAILY. 16 g 5   amLODipine (NORVASC) 10 MG tablet TAKE 1 TABLET BY MOUTH AT BEDTIME. RESUME IF BP>150/90 30 tablet 5    diclofenac (VOLTAREN) 75 MG EC tablet Take 1 tablet (75 mg total) by mouth 2 (two) times daily. (Patient not taking: No sig reported) 30 tablet 0   HYDROcodone-acetaminophen (NORCO) 5-325 MG tablet Take 1-2 tablets by mouth every 6 (six) hours as needed. (Patient not taking: No sig reported) 12 tablet 0   naproxen (NAPROSYN) 500 MG tablet Take 1 tablet (500 mg total) by mouth 2 (two) times daily with a meal. (Patient not taking: Reported on 10/09/2020) 15 tablet 0   No facility-administered medications prior to visit.    ROS See HPI  Objective:  BP (!) 150/90 (BP Location: Left Arm, Patient Position: Sitting, Cuff Size: Large)   Pulse 64   Temp (!) 96.9 F (36.1 C) (Temporal)   Ht '5\' 2"'$  (1.575 m)   Wt 250 lb 12.8 oz (113.8 kg)   SpO2 98%   BMI 45.87 kg/m   Physical Exam Vitals reviewed.  Cardiovascular:     Rate and Rhythm: Normal rate and regular rhythm.     Pulses: Normal pulses.     Heart sounds: Normal heart sounds.  Pulmonary:     Effort: Pulmonary effort is normal.     Breath sounds: Normal breath sounds.  Musculoskeletal:     Right lower leg: Edema present.     Left lower leg: Edema present.  Neurological:     Mental Status:  She is alert and oriented to person, place, and time.    Assessment & Plan:  This visit occurred during the SARS-CoV-2 public health emergency.  Safety protocols were in place, including screening questions prior to the visit, additional usage of staff PPE, and extensive cleaning of exam room while observing appropriate contact time as indicated for disinfecting solutions.   [name] was seen today for follow-up.  Diagnoses and all orders for this visit:  Essential hypertension -     Basic metabolic panel -     TSH -     olmesartan (BENICAR) 20 MG tablet; Take 0.5 tablets (10 mg total) by mouth every morning. -     amLODipine (NORVASC) 10 MG tablet; Take 1 tablet (10 mg total) by mouth at bedtime.  Hyperglycemia -     Hemoglobin  A1c  Elevated LDL cholesterol level -     Lipid panel  Class 3 severe obesity due to excess calories with serious comorbidity and body mass index (BMI) of 45.0 to 49.9 in adult ([facility]) -     Hemoglobin A1c -     TSH   Problem List Items Addressed This Visit       Cardiovascular and Mediastinum   Essential hypertension - Primary    Elevated BP with persistent LE edema. Reports compliance with medications Admits she is not compliant with DASH diet BP Readings from Last 3 Encounters:  10/09/20 (!) 150/90  04/25/20 (!) 147/92  04/10/20 138/90   Med refill sent: amlodipine, benicar and coreg. Repeat TSH and BMP Advised about importance of DASH diet, exercise and use of compressions stocking. She is to send BP reading via mychart in 4days.      Relevant Medications   olmesartan (BENICAR) 20 MG tablet   amLODipine (NORVASC) 10 MG tablet   Other Relevant Orders   Basic metabolic panel   TSH     Other   Class 3 severe obesity due to excess calories with serious comorbidity and body mass index (BMI) of 45.0 to 49.9 in adult [facility])   Relevant Orders   Hemoglobin A1c   TSH   Elevated LDL cholesterol level   Relevant Orders   Lipid panel   Hyperglycemia    repeat HgbA1c Has changed diet to low carb/low sugar diet Lost 13lbs in last 71month Wt Readings from Last 3 Encounters:  10/09/20 250 lb 12.8 oz (113.8 kg)  04/25/20 264 lb 8.8 oz (120 kg)  04/10/20 245 lb 9.6 oz (111.4 kg)        Relevant Orders   Hemoglobin A1c    Follow-up: Return in about 6 months (around 04/10/2021) for CPE (fasting).  CWilfred Lacy NP

## 2020-10-09 NOTE — Assessment & Plan Note (Signed)
repeat HgbA1c Has changed diet to low carb/low sugar diet Lost 13lbs in last 77month Wt Readings from Last 3 Encounters:  10/09/20 250 lb 12.8 oz (113.8 kg)  04/25/20 264 lb 8.8 oz (120 kg)  04/10/20 245 lb 9.6 oz (111.4 kg)

## 2020-10-09 NOTE — Patient Instructions (Signed)
Go to lab for blood draw. Check BP AM and PM Send BP reading via mychart on Thursday.  DASH Eating Plan DASH stands for Dietary Approaches to Stop Hypertension. The DASH eating plan is a healthy eating plan that has been shown to: Reduce high blood pressure (hypertension). Reduce your risk for type 2 diabetes, heart disease, and stroke. Help with weight loss. What are tips for following this plan? Reading food labels Check food labels for the amount of salt (sodium) per serving. Choose foods with less than 5 percent of the Daily Value of sodium. Generally, foods with less than 300 milligrams (mg) of sodium per serving fit into this eating plan. To find whole grains, look for the word "whole" as the first word in the ingredient list. Shopping Buy products labeled as "low-sodium" or "no salt added." Buy fresh foods. Avoid canned foods and pre-made or frozen meals. Cooking Avoid adding salt when cooking. Use salt-free seasonings or herbs instead of table salt or sea salt. Check with your health care provider or pharmacist before using salt substitutes. Do not fry foods. Cook foods using healthy methods such as baking, boiling, grilling, roasting, and broiling instead. Cook with heart-healthy oils, such as olive, canola, avocado, soybean, or sunflower oil. Meal planning  Eat a balanced diet that includes: 4 or more servings of fruits and 4 or more servings of vegetables each day. Try to fill one-half of your plate with fruits and vegetables. 6-8 servings of whole grains each day. Less than 6 oz (170 g) of lean meat, poultry, or fish each day. A 3-oz (85-g) serving of meat is about the same size as a deck of cards. One egg equals 1 oz (28 g). 2-3 servings of low-fat dairy each day. One serving is 1 cup (237 mL). 1 serving of nuts, seeds, or beans 5 times each week. 2-3 servings of heart-healthy fats. Healthy fats called omega-3 fatty acids are found in foods such as walnuts, flaxseeds,  fortified milks, and eggs. These fats are also found in cold-water fish, such as sardines, salmon, and mackerel. Limit how much you eat of: Canned or prepackaged foods. Food that is high in trans fat, such as some fried foods. Food that is high in saturated fat, such as fatty meat. Desserts and other sweets, sugary drinks, and other foods with added sugar. Full-fat dairy products. Do not salt foods before eating. Do not eat more than 4 egg yolks a week. Try to eat at least 2 vegetarian meals a week. Eat more home-cooked food and less restaurant, buffet, and fast food.  Lifestyle When eating at a restaurant, ask that your food be prepared with less salt or no salt, if possible. If you drink alcohol: Limit how much you use to: 0-1 drink a day for women who are not pregnant. 0-2 drinks a day for men. Be aware of how much alcohol is in your drink. In the [place] one drink equals one 12 oz bottle of beer (355 mL), one 5 oz glass of wine (148 mL), or one 1 oz glass of hard liquor (44 mL). General information Avoid eating more than 2,300 mg of salt a day. If you have hypertension, you may need to reduce your sodium intake to 1,500 mg a day. Work with your health care provider to maintain a healthy body weight or to lose weight. Ask what an ideal weight is for you. Get at least 30 minutes of exercise that causes your heart to beat faster (aerobic exercise) most  days of the week. Activities may include walking, swimming, or biking. Work with your health care provider or dietitian to adjust your eating plan to your individual calorie needs. What foods should I eat? Fruits All fresh, dried, or frozen fruit. Canned fruit in natural juice (without addedsugar). Vegetables Fresh or frozen vegetables (raw, steamed, roasted, or grilled). Low-sodium or reduced-sodium tomato and vegetable juice. Low-sodium or reduced-sodium tomatosauce and tomato paste. Low-sodium or reduced-sodium canned  vegetables. Grains Whole-grain or whole-wheat bread. Whole-grain or whole-wheat pasta. Brown rice. [place]. Bulgur. Whole-grain and low-sodium cereals. Pita bread.Low-fat, low-sodium crackers. Whole-wheat flour tortillas. Meats and other proteins Skinless chicken or [place]. Ground chicken or [place]. Pork with fat trimmed off. Fish and seafood. Egg whites. Dried beans, peas, or lentils. Unsalted nuts, nut butters, and seeds. Unsalted canned beans. Lean cuts of beef with fat trimmed off. Low-sodium, lean precooked or cured meat, such as sausages or meatloaves. Dairy Low-fat (1%) or fat-free (skim) milk. Reduced-fat, low-fat, or fat-free cheeses. Nonfat, low-sodium ricotta or cottage cheese. Low-fat or nonfatyogurt. Low-fat, low-sodium cheese. Fats and oils Soft margarine without trans fats. Vegetable oil. Reduced-fat, low-fat, or light mayonnaise and salad dressings (reduced-sodium). Canola, safflower, olive, avocado, soybean, andsunflower oils. Avocado. Seasonings and condiments Herbs. Spices. Seasoning mixes without salt. Other foods Unsalted popcorn and pretzels. Fat-free sweets. The items listed above may not be a complete list of foods and beverages you can eat. Contact a dietitian for more information. What foods should I avoid? Fruits Canned fruit in a light or heavy syrup. Fried fruit. Fruit in cream or buttersauce. Vegetables Creamed or fried vegetables. Vegetables in a cheese sauce. Regular canned vegetables (not low-sodium or reduced-sodium). Regular canned tomato sauce and paste (not low-sodium or reduced-sodium). Regular tomato and vegetable juice(not low-sodium or reduced-sodium). Angie Fava. Olives. Grains Baked goods made with fat, such as croissants, muffins, or some breads. Drypasta or rice meal packs. Meats and other proteins Fatty cuts of meat. Ribs. Fried meat. Berniece Salines. Bologna, salami, and other precooked or cured meats, such as sausages or meat loaves. Fat from the back  of a pig (fatback). Bratwurst. Salted nuts and seeds. Canned beans with added salt. Canned orsmoked fish. Whole eggs or egg yolks. Chicken or [place] with skin. Dairy Whole or 2% milk, cream, and half-and-half. Whole or full-fat cream cheese. Whole-fat or sweetened yogurt. Full-fat cheese. Nondairy creamers. Whippedtoppings. Processed cheese and cheese spreads. Fats and oils Butter. Stick margarine. Lard. Shortening. Ghee. Bacon fat. Tropical oils, suchas coconut, palm kernel, or palm oil. Seasonings and condiments Onion salt, garlic salt, seasoned salt, table salt, and sea salt. Worcestershire sauce. Tartar sauce. Barbecue sauce. Teriyaki sauce. Soy sauce, including reduced-sodium. Steak sauce. Canned and packaged gravies. Fish sauce. Oyster sauce. Cocktail sauce. Store-bought horseradish. Ketchup. Mustard. Meat flavorings and tenderizers. Bouillon cubes. Hot sauces. Pre-made or packaged marinades. Pre-made or packaged taco seasonings. Relishes. Regular saladdressings. Other foods Salted popcorn and pretzels. The items listed above may not be a complete list of foods and beverages you should avoid. Contact a dietitian for more information. Where to find more information National Heart, Lung, and Blood Institute: https://wilson-eaton.com/ American Heart Association: www.heart.org Academy of Nutrition and Dietetics: www.eatright.[place]: www.kidney.org Summary The DASH eating plan is a healthy eating plan that has been shown to reduce high blood pressure (hypertension). It may also reduce your risk for type 2 diabetes, heart disease, and stroke. When on the DASH eating plan, aim to eat more fresh fruits and vegetables, whole grains, lean proteins, low-fat  dairy, and heart-healthy fats. With the DASH eating plan, you should limit salt (sodium) intake to 2,300 mg a day. If you have hypertension, you may need to reduce your sodium intake to 1,500 mg a day. Work with your health care  provider or dietitian to adjust your eating plan to your individual calorie needs. This information is not intended to replace advice given to you by your health care provider. Make sure you discuss any questions you have with your healthcare provider. Document Revised: 01/01/2019 Document Reviewed: 01/01/2019 Elsevier Patient Education  2022 [place] [employer] Calories are units of energy. Your body needs a certain number of calories from food to keep going throughout the day. When you eat or drink more calories than your body needs, your body stores the extra calories mostly as fat. When you eat or drink fewer calories than your body needs, your body burns fat to getthe energy it needs. Calorie counting means keeping track of how many calories you eat and drink each day. Calorie counting can be helpful if you need to lose weight. If you eat fewer calories than your body needs, you should lose weight. Ask yourhealth care provider what a healthy weight is for you. For calorie counting to work, you will need to eat the right number of calories each day to lose a healthy amount of weight per week. A dietitian can help you figure out how many calories you need in a day and will suggest ways to reach your calorie goal. A healthy amount of weight to lose each week is usually 1-2 lb (0.5-0.9 kg). This usually means that your daily calorie intake should be reduced by 500-750 calories. Eating 1,200-1,500 calories a day can help most women lose weight. Eating 1,500-1,800 calories a day can help most men lose weight. What do I need to know about calorie counting? Work with your health care provider or dietitian to determine how many calories you should get each day. To meet your daily calorie goal, you will need to: Find out how many calories are in each food that you would like to eat. Try to do this before you eat. Decide how much of the food you plan to eat. Keep a food log.  Do this by writing down what you ate and how many calories it had. To successfully lose weight, it is important to balance calorie counting with ahealthy lifestyle that includes regular activity. Where do I find calorie information?  The number of calories in a food can be found on a Nutrition Facts label. If a food does not have a Nutrition Facts label, try to look up the calories onlineor ask your dietitian for help. Remember that calories are listed per serving. If you choose to have more than one serving of a food, you will have to multiply the calories per serving by the number of servings you plan to eat. For example, the label on a package of bread might say that a serving size is 1 slice and that there are 90 calories in a serving. If you eat 1 slice, you will have eaten 90 calories. If you eat 2slices, you will have eaten 180 calories. How do I keep a food log? After each time that you eat, record the following in your food log as soon as possible: What you ate. Be sure to include toppings, sauces, and other extras on the food. How much you ate. This can be measured in cups,  ounces, or number of items. How many calories were in each food and drink. The total number of calories in the food you ate. Keep your food log near you, such as in a pocket-sized notebook or on an app or website on your mobile phone. Some programs will calculate calories for you andshow you how many calories you have left to meet your daily goal. What are some portion-control tips? Know how many calories are in a serving. This will help you know how many servings you can have of a certain food. Use a measuring cup to measure serving sizes. You could also try weighing out portions on a kitchen scale. With time, you will be able to estimate serving sizes for some foods. Take time to put servings of different foods on your favorite plates or in your favorite bowls and cups so you know what a serving looks like. Try not to  eat straight from a food's packaging, such as from a bag or box. Eating straight from the package makes it hard to see how much you are eating and can lead to overeating. Put the amount you would like to eat in a cup or on a plate to make sure you are eating the right portion. Use smaller plates, glasses, and bowls for smaller portions and to prevent overeating. Try not to multitask. For example, avoid watching TV or using your computer while eating. If it is time to eat, sit down at a table and enjoy your food. This will help you recognize when you are full. It will also help you be more mindful of what and how much you are eating. What are tips for following this plan? Reading food labels Check the calorie count compared with the serving size. The serving size may be smaller than what you are used to eating. Check the source of the calories. Try to choose foods that are high in protein, fiber, and vitamins, and low in saturated fat, trans fat, and sodium. Shopping Read nutrition labels while you shop. This will help you make healthy decisions about which foods to buy. Pay attention to nutrition labels for low-fat or fat-free foods. These foods sometimes have the same number of calories or more calories than the full-fat versions. They also often have added sugar, starch, or salt to make up for flavor that was removed with the fat. Make a grocery list of lower-calorie foods and stick to it. Cooking Try to cook your favorite foods in a healthier way. For example, try baking instead of frying. Use low-fat dairy products. Meal planning Use more fruits and vegetables. One-half of your plate should be fruits and vegetables. Include lean proteins, such as chicken, [place], and fish. Lifestyle Each week, aim to do one of the following: 150 minutes of moderate exercise, such as walking. 75 minutes of vigorous exercise, such as running. General information Know how many calories are in the foods you eat  most often. This will help you calculate calorie counts faster. Find a way of tracking calories that works for you. Get creative. Try different apps or programs if writing down calories does not work for you. What foods should I eat?  Eat nutritious foods. It is better to have a nutritious, high-calorie food, such as an avocado, than a food with few nutrients, such as a bag of potato chips. Use your calories on foods and drinks that will fill you up and will not leave you hungry soon after eating. Examples of foods that fill  you up are nuts and nut butters, vegetables, lean proteins, and high-fiber foods such as whole grains. High-fiber foods are foods with more than 5 g of fiber per serving. Pay attention to calories in drinks. Low-calorie drinks include water and unsweetened drinks. The items listed above may not be a complete list of foods and beverages you can eat. Contact a dietitian for more information. What foods should I limit? Limit foods or drinks that are not good sources of vitamins, minerals, or protein or that are high in unhealthy fats. These include: Candy. Other sweets. Sodas, specialty coffee drinks, alcohol, and juice. The items listed above may not be a complete list of foods and beverages you should avoid. Contact a dietitian for more information. How do I count calories when eating out? Pay attention to portions. Often, portions are much larger when eating out. Try these tips to keep portions smaller: Consider sharing a meal instead of getting your own. If you get your own meal, eat only half of it. Before you start eating, ask for a container and put half of your meal into it. When available, consider ordering smaller portions from the menu instead of full portions. Pay attention to your food and drink choices. Knowing the way food is cooked and what is included with the meal can help you eat fewer calories. If calories are listed on the menu, choose the lower-calorie  options. Choose dishes that include vegetables, fruits, whole grains, low-fat dairy products, and lean proteins. Choose items that are boiled, broiled, grilled, or steamed. Avoid items that are buttered, battered, fried, or served with cream sauce. Items labeled as crispy are usually fried, unless stated otherwise. Choose water, low-fat milk, unsweetened iced tea, or other drinks without added sugar. If you want an alcoholic beverage, choose a lower-calorie option, such as a glass of wine or light beer. Ask for dressings, sauces, and syrups on the side. These are usually high in calories, so you should limit the amount you eat. If you want a salad, choose a garden salad and ask for grilled meats. Avoid extra toppings such as bacon, cheese, or fried items. Ask for the dressing on the side, or ask for olive oil and vinegar or lemon to use as dressing. Estimate how many servings of a food you are given. Knowing serving sizes will help you be aware of how much food you are eating at restaurants. Where to find more information Centers for Disease Control and Prevention: http://www.wolf.info/ U.[place]. Department of Agriculture: http://www.wilson-mendoza.org/ Summary Calorie counting means keeping track of how many calories you eat and drink each day. If you eat fewer calories than your body needs, you should lose weight. A healthy amount of weight to lose per week is usually 1-2 lb (0.5-0.9 kg). This usually means reducing your daily calorie intake by 500-750 calories. The number of calories in a food can be found on a Nutrition Facts label. If a food does not have a Nutrition Facts label, try to look up the calories online or ask your dietitian for help. Use smaller plates, glasses, and bowls for smaller portions and to prevent overeating. Use your calories on foods and drinks that will fill you up and not leave you hungry shortly after a meal. This information is not intended to replace advice given to you by your health care provider.  Make sure you discuss any questions you have with your healthcare provider. Document Revised: 03/11/2019 Document Reviewed: 03/11/2019 Elsevier Patient Education  2022 [employer].

## 2020-10-12 ENCOUNTER — Other Ambulatory Visit (HOSPITAL_COMMUNITY): Payer: Self-pay

## 2020-10-12 ENCOUNTER — Encounter: Payer: Self-pay | Admitting: Nurse Practitioner

## 2020-10-12 DIAGNOSIS — I10 Essential (primary) hypertension: Secondary | ICD-10-CM

## 2020-10-12 MED ORDER — OLMESARTAN MEDOXOMIL 20 MG PO TABS
20.0000 mg | ORAL_TABLET | Freq: Every morning | ORAL | 3 refills | Status: DC
  Filled 2020-10-12 – 2020-10-19 (×2): qty 90, 90d supply, fill #0

## 2020-10-17 ENCOUNTER — Other Ambulatory Visit (HOSPITAL_COMMUNITY): Payer: Self-pay

## 2020-10-18 ENCOUNTER — Other Ambulatory Visit (HOSPITAL_COMMUNITY): Payer: Self-pay

## 2020-10-19 ENCOUNTER — Other Ambulatory Visit (HOSPITAL_COMMUNITY): Payer: Self-pay

## 2020-11-22 ENCOUNTER — Other Ambulatory Visit (HOSPITAL_COMMUNITY): Payer: Self-pay

## 2020-12-29 ENCOUNTER — Other Ambulatory Visit (HOSPITAL_COMMUNITY): Payer: Self-pay

## 2021-01-25 ENCOUNTER — Other Ambulatory Visit (HOSPITAL_COMMUNITY): Payer: Self-pay

## 2021-01-25 MED ORDER — NEOMYCIN-POLYMYXIN-DEXAMETH 3.5-10000-0.1 OP SUSP
OPHTHALMIC | 0 refills | Status: DC
  Filled 2021-01-25: qty 5, 25d supply, fill #0

## 2021-02-06 ENCOUNTER — Other Ambulatory Visit (HOSPITAL_COMMUNITY): Payer: Self-pay

## 2021-03-12 ENCOUNTER — Other Ambulatory Visit (HOSPITAL_COMMUNITY): Payer: Self-pay

## 2021-05-01 ENCOUNTER — Other Ambulatory Visit (HOSPITAL_COMMUNITY): Payer: Self-pay

## 2021-06-04 ENCOUNTER — Other Ambulatory Visit (HOSPITAL_COMMUNITY): Payer: Self-pay

## 2021-07-18 ENCOUNTER — Other Ambulatory Visit: Payer: Self-pay | Admitting: Nurse Practitioner

## 2021-07-18 DIAGNOSIS — I10 Essential (primary) hypertension: Secondary | ICD-10-CM

## 2021-07-19 ENCOUNTER — Other Ambulatory Visit (HOSPITAL_COMMUNITY): Payer: Self-pay

## 2021-07-19 MED ORDER — CARVEDILOL 25 MG PO TABS
ORAL_TABLET | Freq: Two times a day (BID) | ORAL | 0 refills | Status: DC
  Filled 2021-07-19: qty 60, 30d supply, fill #0
  Filled 2021-08-30: qty 60, 30d supply, fill #1
  Filled 2021-10-11: qty 60, 30d supply, fill #2

## 2021-08-30 ENCOUNTER — Other Ambulatory Visit (HOSPITAL_COMMUNITY): Payer: Self-pay

## 2021-08-31 ENCOUNTER — Other Ambulatory Visit (HOSPITAL_COMMUNITY): Payer: Self-pay

## 2021-10-11 ENCOUNTER — Other Ambulatory Visit (HOSPITAL_COMMUNITY): Payer: Self-pay

## 2021-11-20 ENCOUNTER — Other Ambulatory Visit: Payer: Self-pay

## 2021-11-20 ENCOUNTER — Encounter (HOSPITAL_BASED_OUTPATIENT_CLINIC_OR_DEPARTMENT_OTHER): Payer: Self-pay | Admitting: Emergency Medicine

## 2021-11-20 ENCOUNTER — Emergency Department (HOSPITAL_BASED_OUTPATIENT_CLINIC_OR_DEPARTMENT_OTHER): Payer: Managed Care, Other (non HMO)

## 2021-11-20 ENCOUNTER — Emergency Department (HOSPITAL_BASED_OUTPATIENT_CLINIC_OR_DEPARTMENT_OTHER)
Admission: EM | Admit: 2021-11-20 | Discharge: 2021-11-20 | Disposition: A | Discharge status: Home / Self Care | Payer: Managed Care, Other (non HMO) | Attending: Emergency Medicine | Admitting: Emergency Medicine

## 2021-11-20 ENCOUNTER — Other Ambulatory Visit (HOSPITAL_COMMUNITY): Payer: Self-pay

## 2021-11-20 DIAGNOSIS — R110 Nausea: Secondary | ICD-10-CM | POA: Diagnosis not present

## 2021-11-20 DIAGNOSIS — R519 Headache, unspecified: Secondary | ICD-10-CM | POA: Insufficient documentation

## 2021-11-20 DIAGNOSIS — I10 Essential (primary) hypertension: Secondary | ICD-10-CM | POA: Diagnosis not present

## 2021-11-20 LAB — BASIC METABOLIC PANEL
Anion gap: 8 (ref 5–15)
BUN: 14 mg/dL (ref 8–23)
CO2: 25 mmol/L (ref 22–32)
Calcium: 8.8 mg/dL — ABNORMAL LOW (ref 8.9–10.3)
Chloride: 106 mmol/L (ref 98–111)
Creatinine, Ser: 0.67 mg/dL (ref 0.44–1.00)
GFR, Estimated: >60 mL/min (ref >60)
Glucose, Bld: 106 mg/dL — ABNORMAL HIGH (ref 70–99)
Potassium: 3.6 mmol/L (ref 3.5–5.1)
Sodium: 139 mmol/L (ref 135–145)

## 2021-11-20 LAB — CBC WITH DIFFERENTIAL/PLATELET
Abs Immature Granulocytes: 0.01 10*3/uL (ref 0.00–0.07)
Basophils Absolute: 0 10*3/uL (ref 0.0–0.1)
Basophils Relative: 1 %
Eosinophils Absolute: 0.2 10*3/uL (ref 0.0–0.5)
Eosinophils Relative: 4 %
HCT: 35.3 % — ABNORMAL LOW (ref 36.0–46.0)
Hemoglobin: 11.4 g/dL — ABNORMAL LOW (ref 12.0–15.0)
Immature Granulocytes: 0 %
Lymphocytes Relative: 38 %
Lymphs Abs: 2 10*3/uL (ref 0.7–4.0)
MCH: 29 pg (ref 26.0–34.0)
MCHC: 32.3 g/dL (ref 30.0–36.0)
MCV: 89.8 fL (ref 80.0–100.0)
Monocytes Absolute: 0.5 10*3/uL (ref 0.1–1.0)
Monocytes Relative: 9 %
Neutro Abs: 2.5 10*3/uL (ref 1.7–7.7)
Neutrophils Relative %: 48 %
Platelets: 257 10*3/uL (ref 150–400)
RBC: 3.93 MIL/uL (ref 3.87–5.11)
RDW: 13.2 % (ref 11.5–15.5)
WBC: 5.2 10*3/uL (ref 4.0–10.5)
nRBC: 0 % (ref 0.0–0.2)

## 2021-11-20 MED ORDER — CARVEDILOL 25 MG PO TABS
25.0000 mg | ORAL_TABLET | Freq: Two times a day (BID) | ORAL | 0 refills | Status: DC
  Filled 2021-11-20: qty 60, 30d supply, fill #0

## 2021-11-20 MED ORDER — METOCLOPRAMIDE HCL 5 MG/ML IJ SOLN
10.0000 mg | Freq: Once | INTRAMUSCULAR | Status: AC
  Administered 2021-11-20: 10 mg via INTRAVENOUS
  Filled 2021-11-20: qty 2

## 2021-11-20 MED ORDER — CARVEDILOL 12.5 MG PO TABS
25.0000 mg | ORAL_TABLET | Freq: Once | ORAL | Status: AC
  Administered 2021-11-20: 25 mg via ORAL
  Filled 2021-11-20: qty 2

## 2021-11-20 MED ORDER — MOUNJARO 2.5 MG/0.5ML ~~LOC~~ SOAJ
2.5000 mg | SUBCUTANEOUS | 0 refills | Status: DC
  Filled 2021-11-20 – 2021-12-28 (×2): qty 2, 28d supply, fill #0

## 2021-11-20 NOTE — ED Triage Notes (Signed)
Headache that woke pt up around 00:00 pt tried ice with no relief.

## 2021-11-20 NOTE — ED Provider Notes (Signed)
[place] EMERGENCY DEPARTMENT Provider Note   CSN: [id number] Arrival date & time: 11/20/21  0442     History  Chief Complaint  Patient presents with   Headache    Jennifer [name] is a 61 y.o. female.  The history is provided by the patient.  Headache Jennifer [name] is a 61 y.o. female who presents to the Emergency Department complaining of headache.  She presents to the emergency department for evaluation of severe headache that woke her from sleep at midnight.  She had associated nausea.  Overall her headache is improving but she does have persistent pain to the back of her head.  She has a history of hypertension.  She just took her last blood pressure medicine last night.  She is not scheduled to follow-up with her doctor for another month.  No associated chest pain, numbness, weakness, vision changes.  She has experienced similar headache in the past.  She presents today for evaluation due to a coworker having a stroke with similar symptoms and she was concerned.     Home Medications Prior to Admission medications   Medication Sig Start Date End Date Taking? Authorizing Provider  Albuterol Sulfate (PROAIR RESPICLICK) 101 (90 Base) MCG/ACT AEPB Inhale 1 puff into the lungs every 6 (six) hours as needed. 01/26/18   Nche, Charlene Brooke, NP  amLODipine (NORVASC) 10 MG tablet Take 1 tablet (10 mg total) by mouth at bedtime. 10/09/20 10/09/21  Nche, Charlene Brooke, NP  carvedilol (COREG) 25 MG tablet Take 1 tablet (25 mg total) by mouth 2 (two) times daily with a meal. 11/20/21 12/20/21  Quintella Reichert, MD  cyclobenzaprine (FLEXERIL) 10 MG tablet Take 1 tablet (10 mg total) by mouth 3 (three) times daily as needed for muscle spasms. 10/20/19   Dagoberto Ligas, MD  fluticasone (FLONASE) 50 MCG/ACT nasal spray PLACE 2 SPRAYS INTO BOTH NOSTRILS DAILY. 08/13/19 08/12/20  Nche, Charlene Brooke, NP  MAGNESIUM PO Take by mouth.     [provider]  Multiple Vitamin  (MULTIVITAMIN WITH MINERALS) TABS tablet Take 1 tablet by mouth at bedtime. ALIVE multi-vitamin    [provider]  neomycin-polymyxin b-dexamethasone (MAXITROL) 3.5-10000-0.1 SUSP Instill 1 drop into right eye four times a day 01/25/21     olmesartan (BENICAR) 20 MG tablet Take 1 tablet (20 mg total) by mouth every morning. 10/12/20   Nche, Charlene Brooke, NP  OVER THE COUNTER MEDICATION Herbal Tea daily     [provider]      Allergies    Ace inhibitors and Codeine    Review of Systems   Review of Systems  Neurological:  Positive for headaches.  All other systems reviewed and are negative.   Physical Exam Updated Vital Signs BP 135/67   Pulse 62   Temp 98.7 F (37.1 C) (Oral)   Resp 18   Ht '5\' 2"'$  (1.575 m)   Wt 99.8 kg   SpO2 95%   BMI 40.24 kg/m  Physical Exam Vitals and nursing note reviewed.  Constitutional:      Appearance: She is well-developed.  HENT:     Head: Normocephalic and atraumatic.  Cardiovascular:     Rate and Rhythm: Normal rate and regular rhythm.  Pulmonary:     Effort: Pulmonary effort is normal.     Breath sounds: Normal breath sounds.  Abdominal:     Palpations: Abdomen is soft.     Tenderness: There is no abdominal tenderness. There is no guarding or rebound.  Musculoskeletal:        General: No tenderness.     Cervical back: Neck supple. No tenderness.  Skin:    General: Skin is warm and dry.  Neurological:     Mental Status: She is alert and oriented to person, place, and time.     Comments: No asymmetry of facial movements.  Visual fields grossly intact.  No pronator drift.  5/5 strength in all four extremities.  Normal gait.    Psychiatric:        Behavior: Behavior normal.     ED Results / Procedures / Treatments   Labs (all labs ordered are listed, but only abnormal results are displayed) Labs Reviewed  BASIC METABOLIC PANEL - Abnormal; Notable for the following components:      Result Value   Glucose, Bld 106  (*)    Calcium 8.8 (*)    All other components within normal limits  CBC WITH DIFFERENTIAL/PLATELET - Abnormal; Notable for the following components:   Hemoglobin 11.4 (*)    HCT 35.3 (*)    All other components within normal limits    EKG None  Radiology CT Head Wo Contrast  Result Date: 11/20/2021 CLINICAL DATA:  Sudden onset severe headache. EXAM: CT HEAD WITHOUT CONTRAST TECHNIQUE: Contiguous axial images were obtained from the base of the skull through the vertex without intravenous contrast. RADIATION DOSE REDUCTION: This exam was performed according to the departmental dose-optimization program which includes automated exposure control, adjustment of the mA and/or kV according to patient size and/or use of iterative reconstruction technique. COMPARISON:  None Available. FINDINGS: Brain: There is no evidence for acute hemorrhage, hydrocephalus, mass lesion, or abnormal extra-axial fluid collection. No definite CT evidence for acute infarction. Patchy low attenuation in the deep hemispheric and periventricular white matter is nonspecific, but likely reflects chronic microvascular ischemic demyelination. Vascular: No hyperdense vessel or unexpected calcification. Skull: No evidence for fracture. No worrisome lytic or sclerotic lesion. Sinuses/Orbits: The visualized paranasal sinuses and mastoid air cells are clear. Visualized portions of the globes and intraorbital fat are unremarkable. Other: None. IMPRESSION: 1. No acute intracranial abnormality. 2. Nonspecific chronic small vessel ischemic disease. Electronically Signed   By: Misty Stanley M.D.   On: 11/20/2021 06:56    Procedures Procedures    Medications Ordered in ED Medications  carvedilol (COREG) tablet 25 mg (25 mg Oral Given 11/20/21 0538)  metoCLOPramide (REGLAN) injection 10 mg (10 mg Intravenous Given 11/20/21 0539)    ED Course/ Medical Decision Making/ A&P                           Medical Decision Making Amount  and/or Complexity of Data Reviewed Labs: ordered. Radiology: ordered.  Risk Prescription drug management.   Patient with history of hypertension here for evaluation of headache.  She is nontoxic-appearing on evaluation with no focal neurologic deficits.  She is hypertensive on ED presentation.  Her headache was treated and her home medication was ordered.  On reassessment her headache is resolved and her blood pressure has returned to normal.  CT scan is negative for acute abnormality.  Renal function is within normal limits.  Current clinical picture is not consistent with hypertensive urgency, subarachnoid hemorrhage, acute CVA.  Will prescribe refill for her carvedilol.  Discussed with patient home care for headache, hypertension.  Discussed outpatient follow-up and return precautions.        Final Clinical Impression(s) / ED Diagnoses Final diagnoses:  Bad headache  Primary hypertension    Rx / DC Orders ED Discharge Orders          Ordered    carvedilol (COREG) 25 MG tablet  2 times daily with meals       Note to Pharmacy: Needs appointment for further refills.   11/20/21 3700              Quintella Reichert, MD 11/20/21 [phone]

## 2021-11-21 ENCOUNTER — Other Ambulatory Visit (HOSPITAL_COMMUNITY): Payer: Self-pay

## 2021-12-11 ENCOUNTER — Other Ambulatory Visit (HOSPITAL_COMMUNITY): Payer: Self-pay

## 2021-12-11 MED ORDER — MOUNJARO 2.5 MG/0.5ML ~~LOC~~ SOAJ
2.5000 mg | SUBCUTANEOUS | 0 refills | Status: DC
  Filled 2021-12-11: qty 2, 28d supply, fill #0

## 2021-12-12 ENCOUNTER — Other Ambulatory Visit (HOSPITAL_COMMUNITY): Payer: Self-pay

## 2021-12-27 ENCOUNTER — Other Ambulatory Visit (HOSPITAL_COMMUNITY): Payer: Self-pay

## 2021-12-27 ENCOUNTER — Telehealth: Payer: Self-pay | Admitting: Nurse Practitioner

## 2021-12-27 DIAGNOSIS — I10 Essential (primary) hypertension: Secondary | ICD-10-CM

## 2021-12-27 MED ORDER — CARVEDILOL 25 MG PO TABS
25.0000 mg | ORAL_TABLET | Freq: Two times a day (BID) | ORAL | 0 refills | Status: DC
  Filled 2021-12-27: qty 60, 30d supply, fill #0

## 2021-12-27 NOTE — Telephone Encounter (Signed)
Pt called requesting a refill of the following medication to get her to her CPE she has scheduled:  Medication:   Rx #: [id number]  carvedilol (COREG) 25 MG tablet [[id number]]  ENDED   Has the patient contacted their pharmacy? No. (If no, request that the patient contact the pharmacy for the refill.) (If yes, when and what did the pharmacy advise?)  Preferred Pharmacy (with phone number or street name):   [facility] [address], [place] [address] Phone: [phone]  Fax: [phone]   Agent: Please be advised that RX refills may take up to 3 business days. We ask that you follow-up with your pharmacy.

## 2021-12-28 ENCOUNTER — Other Ambulatory Visit (HOSPITAL_COMMUNITY): Payer: Self-pay

## 2022-01-02 ENCOUNTER — Other Ambulatory Visit (HOSPITAL_COMMUNITY): Payer: Self-pay

## 2022-01-04 ENCOUNTER — Other Ambulatory Visit (HOSPITAL_COMMUNITY): Payer: Self-pay

## 2022-01-07 ENCOUNTER — Other Ambulatory Visit (HOSPITAL_COMMUNITY): Payer: Self-pay

## 2022-01-07 ENCOUNTER — Encounter: Payer: Self-pay | Admitting: Nurse Practitioner

## 2022-01-07 ENCOUNTER — Encounter (HOSPITAL_COMMUNITY): Payer: Self-pay

## 2022-01-07 ENCOUNTER — Ambulatory Visit (INDEPENDENT_AMBULATORY_CARE_PROVIDER_SITE_OTHER): Payer: Managed Care, Other (non HMO) | Admitting: Nurse Practitioner

## 2022-01-07 VITALS — BP 180/100 | HR 71 | Temp 97.5°F | Ht 62.0 in | Wt 230.2 lb

## 2022-01-07 DIAGNOSIS — Z1231 Encounter for screening mammogram for malignant neoplasm of breast: Secondary | ICD-10-CM

## 2022-01-07 DIAGNOSIS — D649 Anemia, unspecified: Secondary | ICD-10-CM

## 2022-01-07 DIAGNOSIS — Z23 Encounter for immunization: Secondary | ICD-10-CM | POA: Diagnosis not present

## 2022-01-07 DIAGNOSIS — R7303 Prediabetes: Secondary | ICD-10-CM | POA: Diagnosis not present

## 2022-01-07 DIAGNOSIS — Z0001 Encounter for general adult medical examination with abnormal findings: Secondary | ICD-10-CM | POA: Diagnosis not present

## 2022-01-07 DIAGNOSIS — Z6841 Body Mass Index (BMI) 40.0 and over, adult: Secondary | ICD-10-CM

## 2022-01-07 DIAGNOSIS — E7800 Pure hypercholesterolemia, unspecified: Secondary | ICD-10-CM

## 2022-01-07 DIAGNOSIS — I10 Essential (primary) hypertension: Secondary | ICD-10-CM | POA: Diagnosis not present

## 2022-01-07 DIAGNOSIS — I480 Paroxysmal atrial fibrillation: Secondary | ICD-10-CM

## 2022-01-07 LAB — CBC
HCT: 38.1 % (ref 36.0–46.0)
Hemoglobin: 12.4 g/dL (ref 12.0–15.0)
MCHC: 32.5 g/dL (ref 30.0–36.0)
MCV: 89 fl (ref 78.0–100.0)
Platelets: 277 10*3/uL (ref 150.0–400.0)
RBC: 4.28 Mil/uL (ref 3.87–5.11)
RDW: 14 % (ref 11.5–15.5)
WBC: 6.2 10*3/uL (ref 4.0–10.5)

## 2022-01-07 LAB — LIPID PANEL
Cholesterol: 184 mg/dL (ref 0–200)
HDL: 56.7 mg/dL (ref >39.00)
LDL Cholesterol: 116 mg/dL — ABNORMAL HIGH (ref 0–99)
NonHDL: 127.06
Total CHOL/HDL Ratio: 3
Triglycerides: 54 mg/dL (ref 0.0–149.0)
VLDL: 10.8 mg/dL (ref 0.0–40.0)

## 2022-01-07 LAB — HEMOGLOBIN A1C: Hgb A1c MFr Bld: 5.7 % (ref 4.6–6.5)

## 2022-01-07 MED ORDER — OLMESARTAN MEDOXOMIL 20 MG PO TABS
20.0000 mg | ORAL_TABLET | Freq: Every morning | ORAL | 3 refills | Status: DC
  Filled 2022-01-07: qty 30, 30d supply, fill #0

## 2022-01-07 MED ORDER — CARVEDILOL 25 MG PO TABS
25.0000 mg | ORAL_TABLET | Freq: Two times a day (BID) | ORAL | 3 refills | Status: DC
  Filled 2022-01-07: qty 180, 90d supply, fill #0
  Filled 2022-01-31 (×2): qty 60, 30d supply, fill #0
  Filled 2022-03-01: qty 60, 30d supply, fill #1
  Filled 2022-04-03: qty 60, 30d supply, fill #2
  Filled 2022-05-15: qty 60, 30d supply, fill #3
  Filled 2022-06-13: qty 60, 30d supply, fill #4
  Filled 2022-08-14: qty 60, 30d supply, fill #5
  Filled 2022-09-16: qty 60, 30d supply, fill #6
  Filled 2022-11-14: qty 60, 30d supply, fill #7
  Filled 2022-12-20: qty 60, 30d supply, fill #8

## 2022-01-07 MED ORDER — AMLODIPINE BESYLATE 10 MG PO TABS
10.0000 mg | ORAL_TABLET | Freq: Every day | ORAL | 3 refills | Status: DC
  Filled 2022-01-07: qty 30, 30d supply, fill #0
  Filled 2022-03-01: qty 30, 30d supply, fill #1
  Filled 2022-04-03: qty 30, 30d supply, fill #2
  Filled 2022-12-04: qty 30, 30d supply, fill #3

## 2022-01-07 MED ORDER — WEGOVY 1 MG/0.5ML ~~LOC~~ SOAJ
1.0000 mg | SUBCUTANEOUS | 0 refills | Status: DC
  Filled 2022-01-07: qty 2, 28d supply, fill #0

## 2022-01-07 NOTE — Assessment & Plan Note (Signed)
Repeat cbc

## 2022-01-07 NOTE — Patient Instructions (Addendum)
Resume benicar and amlodipine Maintain coreg dose. Let me know if you nee CPAP machine supplies. Maintain DASH diet and daily exercise. Schedule appt for mammogram  Go to lab  Living With Sleep Apnea Sleep apnea is a condition in which breathing pauses or becomes shallow during sleep. Sleep apnea is most commonly caused by a collapsed or blocked airway. People with sleep apnea usually snore loudly. They may have times when they gasp and stop breathing for 10 seconds or more during sleep. This may happen many times during the night. The breaks in breathing also interrupt the deep sleep that you need to feel rested. Even if you do not completely wake up from the gaps in breathing, your sleep may not be restful and you feel tired during the day. You may also have a headache in the morning and low energy during the day, and you may feel anxious or depressed. How can sleep apnea affect me? Sleep apnea increases your chances of extreme tiredness during the day (daytime fatigue). It can also increase your risk for health conditions, such as: Heart attack. Stroke. Obesity. Type 2 diabetes. Heart failure. Irregular heartbeat. High blood pressure. If you have daytime fatigue as a result of sleep apnea, you may be more likely to: Perform poorly at school or work. Fall asleep while driving. Have difficulty with attention. Develop depression or anxiety. Have sexual dysfunction. What actions can I take to manage sleep apnea? Sleep apnea treatment  If you were given a device to open your airway while you sleep, use it only as told by your health care provider. You may be given: An oral appliance. This is a custom-made mouthpiece that shifts your lower jaw forward. A continuous positive airway pressure (CPAP) device. This device blows air through a mask when you breathe out (exhale). A nasal expiratory positive airway pressure (EPAP) device. This device has valves that you put into each nostril. A  bi-level positive airway pressure (BIPAP) device. This device blows air through a mask when you breathe in (inhale) and breathe out (exhale). You may need surgery if other treatments do not work for you. Sleep habits Go to sleep and wake up at the same time every day. This helps set your internal clock (circadian rhythm) for sleeping. If you stay up later than usual, such as on weekends, try to get up in the morning within 2 hours of your normal wake time. Try to get at least 7-9 hours of sleep each night. Stop using a computer, tablet, and mobile phone a few hours before bedtime. Do not take long naps during the day. If you nap, limit it to 30 minutes. Have a relaxing bedtime routine. Reading or listening to music may relax you and help you sleep. Use your bedroom only for sleep. Keep your television and computer out of your bedroom. Keep your bedroom cool, dark, and quiet. Use a supportive mattress and pillows. Follow your health care provider's instructions for other changes to sleep habits. Nutrition Do not eat heavy meals in the evening. Do not have caffeine in the later part of the day. The effects of caffeine can last for more than 5 hours. Follow your health care provider's or dietitian's instructions for any diet changes. Lifestyle     Do not drink alcohol before bedtime. Alcohol can cause you to fall asleep at first, but then it can cause you to wake up in the middle of the night and have trouble getting back to sleep. Do not use  any products that contain nicotine or tobacco. These products include cigarettes, chewing tobacco, and vaping devices, such as e-cigarettes. If you need help quitting, ask your health care provider. Medicines Take over-the-counter and prescription medicines only as told by your health care provider. Do not use over-the-counter sleep medicine. You can become dependent on this medicine, and it can make sleep apnea worse. Do not use medicines, such as  sedatives and narcotics, unless told by your health care provider. Activity Exercise on most days, but avoid exercising in the evening. Exercising near bedtime can interfere with sleeping. If possible, spend time outside every day. Natural light helps regulate your circadian rhythm. General information Lose weight if you need to, and maintain a healthy weight. Keep all follow-up visits. This is important. If you are having surgery, make sure to tell your health care provider that you have sleep apnea. You may need to bring your device with you. Where to find more information Learn more about sleep apnea and daytime fatigue from: American Sleep Association: sleepassociation.[place]: sleepfoundation.org National Heart, Lung, and Blood Institute: https://www.[place].biz/ Summary Sleep apnea is a condition in which breathing pauses or becomes shallow during sleep. Sleep apnea can cause daytime fatigue and other serious health conditions. You may need to wear a device while sleeping to help keep your airway open. If you are having surgery, make sure to tell your health care provider that you have sleep apnea. You may need to bring your device with you. Making changes to sleep habits, diet, lifestyle, and activity can help you manage sleep apnea. This information is not intended to replace advice given to you by your health care provider. Make sure you discuss any questions you have with your health care provider. Document Revised: 09/06/2020 Document Reviewed: 01/07/2020 Elsevier Patient Education  Jennifer [name].

## 2022-01-07 NOTE — Progress Notes (Signed)
Complete physical exam  Patient: Jennifer [name]   DOB: [birth year]   61 y.o. Female  MRN: [id number] Visit Date: 01/07/2022  Subjective:    Chief Complaint  Patient presents with   Annual Exam    CPE Pt fasting  No concerns  Shingles vaccine given today    Jennifer [name] is a 61 y.o. female who presents today for a complete physical exam. She reports consuming a low fat diet.  Exercise 2-3xweek: weight training and cardio  She generally feels well. She reports sleeping fairly well. She does have additional problems to discuss today.  Vision:Yes Dental:No STD Screen:No  Wt Readings from Last 3 Encounters:  01/07/22 230 lb 3.2 oz (104.4 kg)  11/20/21 220 lb (99.8 kg)  10/09/20 250 lb 12.8 oz (113.8 kg)    Most recent fall risk assessment:    06/23/2019    8:16 AM  [place] in the past year? 0  Number falls in past yr: 0  Injury with Fall? 0     Depression screen:Yes - No Depression  Most recent depression screenings:    01/07/2022    2:41 PM 10/09/2020   11:34 AM  PHQ 2/9 Scores  PHQ - 2 Score 0 0  PHQ- 9 Score 1 1    HPI  Essential hypertension Uncontrolled BP Noncompliant with BP medications and CPAP machine. Reports intermittent headaches BP Readings from Last 3 Encounters:  01/07/22 (!) 180/100  11/20/21 135/67  10/09/20 (!) 150/90    Advised about possible complications of uncontrolled BP and OSA e.g CKD, MI, CVA and possible death. Advised to resume coreg, amlodipine and benicar Med refill sent F/up in 4weeks  PAF (paroxysmal atrial fibrillation) (HCC) Normal rate and rhythm today. Current use of coreg BID. Previous use of flecainide and xarelto. Unsure why medications were discontinued. Decline to use CPAP. Denies any palpitations of syncope or chest pain  Anemia Repeat cbc  Class 3 severe obesity due to excess calories with serious comorbidity and body mass index (BMI) of 45.0 to 49.9 in adult ([place]) 20lbs weight loss with  lifestyle modificationsin last 1year: low sodium/low fat/low carb diet and exercise 2x/week at the gym (cardio and strength training). Wt Readings from Last 3 Encounters:  01/07/22 230 lb 3.2 oz (104.4 kg)  11/20/21 220 lb (99.8 kg)  10/09/20 250 lb 12.8 oz (113.8 kg)  Hx of obstructive sleep apnea, prediabetes, hyperlipidemia and HTN.  Repeat hgbA1c today Sent wegovy rx   Past Medical History:  Diagnosis Date   Allergy    allergic rhinitis   Anemia 06/2007   C-scope 06/2007: tics, neg hemocults 07/2007   Anxiety    Blood transfusion without reported diagnosis    at birth   Chest pain 12/2007   negative ECHO, neg stress test 06/2008   Condyloma acuminatum    history of   Hyperglycemia    borderline. A1c 5.7  12/2007   Hypertension    Personal history of atrial fibrillation    on carvedilol only    Sleep apnea    wears cpap at night   Past Surgical History:  Procedure Laterality Date   ABDOMINAL HYSTERECTOMY     no oophorectomy   BREAST EXCISIONAL BIOPSY     BREAST SURGERY  age 89   Fibroid tumor removed right breast   CARPAL TUNNEL RELEASE     CESAREAN SECTION     CHOLECYSTECTOMY     COLONOSCOPY     Social History  Socioeconomic History   Marital status: Divorced    Spouse name: Not on file   Number of children: 3   Years of education: Not on file   Highest education level: Not on file  Occupational History    Employer: [employer]  Tobacco Use   Smoking status: Former    Packs/day: 0.50    Years: 27.00    Total pack years: 13.50    Types: Cigarettes    Quit date: 02/12/2004    Years since quitting: 17.9   Smokeless tobacco: Never  Substance and Sexual Activity   Alcohol use: No   Drug use: No   Sexual activity: Not Currently    Birth control/protection: Post-menopausal, Surgical    Comment: s/p hysterectomy  Other Topics Concern   Not on file  Social History Narrative   Regular exercise  No   Caffeine-- 1 daily   Works in an Design[employer], [employer]- does Teaching laboratory technician under microscopic   Divorced 2013   3 children (all with her)   Enjoys church   1 dog (1/2 poodle, 1/2 shitzu)   Social Determinants of Radio broadcast assistant Strain: Not on file  Food Insecurity: Not on file  Transportation Needs: Not on file  Physical Activity: Not on file  Stress: Not on file  Social Connections: Not on file  Intimate Partner Violence: Not on file   Family Status  Relation Name Status   Ethlyn Daniels  Deceased at age 67       breast cancer   Cousin  Deceased at age 88   Mother  29   Father  Alive   Sister  Center  (Not Specified)   PGM  (Not Specified)   Other  (Not Specified)   Child  (Not Specified)   Child  (Not Specified)   Child  (Not Specified)   Neg Hx  (Not Specified)   Family History  Problem Relation Age of Onset   Breast cancer Paternal Aunt    Cervical cancer Cousin    Diabetes Mother    Coronary artery disease Father    Kidney disease Father    Diabetes Father    Hypertension Father    Prostate cancer Father    Coronary artery disease Sister    Kidney disease Sister    Diabetes Sister    Hypertension Sister    Kidney disease Brother    Stroke Brother    Hypertension Brother        x3   Diabetes Maternal Grandmother    Stroke Maternal Grandmother    Diabetes Paternal Grandmother    Stroke Paternal Grandmother    Coronary artery disease Other        grandmother, uncle   Kidney disease Other        grandmother   Stroke Other        uncle   Thyroid disease Other        aunts   Glaucoma Other        grandmother   Hypertension Other        grandmother   Diabetes Other        aunts   Hypothyroidism Other        aunt   Hyperthyroidism Other        aunt   Healthy Child    Healthy Child    Healthy Child    Colon cancer Neg Hx  Colon polyps Neg Hx    Esophageal cancer Neg Hx    Rectal cancer Neg Hx    Stomach cancer Neg Hx    Allergies   Allergen Reactions   Ace Inhibitors Cough   Codeine Nausea And Vomiting    Patient Care Team: Shawntina Diffee, Charlene Brooke, NP as PCP - General (Internal Medicine) Inda Castle, MD (Inactive) as Consulting Physician (Gastroenterology)   Medications: Outpatient Medications Prior to Visit  Medication Sig   Albuterol Sulfate (PROAIR RESPICLICK) 676 (90 Base) MCG/ACT AEPB Inhale 1 puff into the lungs every 6 (six) hours as needed.   MAGNESIUM PO Take by mouth.    Multiple Vitamin (MULTIVITAMIN WITH MINERALS) TABS tablet Take 1 tablet by mouth at bedtime. ALIVE multi-vitamin   neomycin-polymyxin b-dexamethasone (MAXITROL) 3.5-10000-0.1 SUSP Instill 1 drop into right eye four times a day   OVER THE COUNTER MEDICATION Herbal Tea daily    [DISCONTINUED] carvedilol (COREG) 25 MG tablet Take 1 tablet (25 mg total) by mouth 2 (two) times daily with a meal.   fluticasone (FLONASE) 50 MCG/ACT nasal spray PLACE 2 SPRAYS INTO BOTH NOSTRILS DAILY.   [DISCONTINUED] amLODipine (NORVASC) 10 MG tablet Take 1 tablet (10 mg total) by mouth at bedtime.   [DISCONTINUED] cyclobenzaprine (FLEXERIL) 10 MG tablet Take 1 tablet (10 mg total) by mouth 3 (three) times daily as needed for muscle spasms. (Patient not taking: Reported on 01/07/2022)   [DISCONTINUED] olmesartan (BENICAR) 20 MG tablet Take 1 tablet (20 mg total) by mouth every morning. (Patient not taking: Reported on 01/07/2022)   [DISCONTINUED] tirzepatide [facility]) 2.5 MG/0.5ML Pen Inject 2.5 mg into the skin once a week. (Patient not taking: Reported on 01/07/2022)   [DISCONTINUED] tirzepatide [facility]) 2.5 MG/0.5ML Pen Inject 2.5 mg into the skin once a week. (Patient not taking: Reported on 01/07/2022)   No facility-administered medications prior to visit.    Review of Systems      Objective:  BP (!) 180/100   Pulse 71   Temp (!) 97.5 F (36.4 C) (Temporal)   Ht '5\' 2"'$  (1.575 m)   Wt 230 lb 3.2 oz (104.4 kg)   SpO2 96%   BMI 42.10 kg/m      Physical Exam Vitals and nursing note reviewed.  Constitutional:      General: She is not in acute distress.    Appearance: She is obese.  HENT:     Right Ear: Tympanic membrane, ear canal and external ear normal.     Left Ear: Tympanic membrane, ear canal and external ear normal.     Nose: Nose normal.  Eyes:     General: No scleral icterus.    Extraocular Movements: Extraocular movements intact.     Conjunctiva/sclera: Conjunctivae normal.     Pupils: Pupils are equal, round, and reactive to light.  Cardiovascular:     Rate and Rhythm: Normal rate and regular rhythm.     Heart sounds: Normal heart sounds.  Pulmonary:     Effort: Pulmonary effort is normal. No respiratory distress.     Breath sounds: Normal breath sounds.  Chest:     Comments: Declined breast exam Abdominal:     General: Bowel sounds are normal. There is no distension.     Palpations: Abdomen is soft.  Musculoskeletal:        General: Normal range of motion.     Cervical back: Normal range of motion and neck supple.  Lymphadenopathy:     Cervical: No cervical adenopathy.  Skin:  General: Skin is warm and dry.  Neurological:     Mental Status: She is alert and oriented to person, place, and time.  Psychiatric:        Mood and Affect: Mood normal.        Behavior: Behavior normal.        Thought Content: Thought content normal.      No results found for any visits on 01/07/22.    Assessment & Plan:    Routine Health Maintenance and Physical Exam  Immunization History  Administered Date(s) Administered   Influenza,inj,Quad PF,6+ Mos 01/28/2014, 01/13/2015, 10/20/2015   Influenza-Unspecified 12/19/2011, 11/11/2012   PFIZER(Purple Top)SARS-COV-2 Vaccination 05/21/2019, 06/14/2019   Td 06/27/2006   Tdap 04/11/2017   Zoster Recombinat (Shingrix) 01/07/2022    Health Maintenance  Topic Date Due   MAMMOGRAM  04/15/2021   COVID-19 Vaccine (3 - 2023-24 season) 01/23/2022 (Originally 10/12/2021)    INFLUENZA VACCINE  05/12/2022 (Originally 09/11/2021)   Zoster Vaccines- Shingrix (2 of 2) 03/04/2022   COLONOSCOPY (Pts 45-52yr Insurance coverage will need to be confirmed)  12/27/2027   Hepatitis C Screening  Completed   HIV Screening  Completed   HPV VACCINES  Aged Out    Discussed health benefits of physical activity, and encouraged her to engage in regular exercise appropriate for her age and condition.  Problem List Items Addressed This Visit       Cardiovascular and Mediastinum   Essential hypertension    Uncontrolled BP Noncompliant with BP medications and CPAP machine. Reports intermittent headaches BP Readings from Last 3 Encounters:  01/07/22 (!) 180/100  11/20/21 135/67  10/09/20 (!) 150/90    Advised about possible complications of uncontrolled BP and OSA e.g CKD, MI, CVA and possible death. Advised to resume coreg, amlodipine and benicar Med refill sent F/up in 4weeks      Relevant Medications   olmesartan (BENICAR) 20 MG tablet   amLODipine (NORVASC) 10 MG tablet   carvedilol (COREG) 25 MG tablet   Semaglutide-Weight Management (WEGOVY) 1 MG/0.5ML SOAJ   PAF (paroxysmal atrial fibrillation) (HCC)    Normal rate and rhythm today. Current use of coreg BID. Previous use of flecainide and xarelto. Unsure why medications were discontinued. Decline to use CPAP. Denies any palpitations of syncope or chest pain      Relevant Medications   olmesartan (BENICAR) 20 MG tablet   amLODipine (NORVASC) 10 MG tablet   carvedilol (COREG) 25 MG tablet     Other   Anemia    Repeat cbc      Relevant Orders   CBC   Class 3 severe obesity due to excess calories with serious comorbidity and body mass index (BMI) of 45.0 to 49.9 in adult (HCC)    20lbs weight loss with lifestyle modificationsin last 1year: low sodium/low fat/low carb diet and exercise 2x/week at the gym (cardio and strength training). Wt Readings from Last 3 Encounters:  01/07/22 230 lb 3.2 oz (104.4  kg)  11/20/21 220 lb (99.8 kg)  10/09/20 250 lb 12.8 oz (113.8 kg)  Hx of obstructive sleep apnea, prediabetes, hyperlipidemia and HTN.  Repeat hgbA1c today Sent wegovy rx      Relevant Medications   Semaglutide-Weight Management (WEGOVY) 1 MG/0.5ML SOAJ   Elevated LDL cholesterol level   Relevant Medications   Semaglutide-Weight Management (WEGOVY) 1 MG/0.5ML SOAJ   Other Relevant Orders   Lipid panel   Hyperglycemia   Relevant Medications   Semaglutide-Weight Management (WEGOVY) 1 MG/0.5ML SOAJ   Other  Visit Diagnoses     Encounter for preventative adult health care exam with abnormal findings    -  Primary   Need for shingles vaccine       Relevant Orders   Zoster Recombinant (Shingrix ) (Completed)   Breast cancer screening by mammogram       Relevant Orders   MM 3D SCREEN BREAST BILATERAL      Return in about 25 days (around 02/01/2022) for HTN.     Wilfred Lacy, NP

## 2022-01-07 NOTE — Assessment & Plan Note (Addendum)
Normal rate and rhythm today. Current use of coreg BID. Previous use of flecainide and xarelto. Unsure why medications were discontinued. Decline to use CPAP. Denies any palpitations of syncope or chest pain

## 2022-01-07 NOTE — Assessment & Plan Note (Signed)
Uncontrolled BP Noncompliant with BP medications and CPAP machine. Reports intermittent headaches BP Readings from Last 3 Encounters:  01/07/22 (!) 180/100  11/20/21 135/67  10/09/20 (!) 150/90    Advised about possible complications of uncontrolled BP and OSA e.g CKD, MI, CVA and possible death. Advised to resume coreg, amlodipine and benicar Med refill sent F/up in 4weeks

## 2022-01-07 NOTE — Assessment & Plan Note (Addendum)
20lbs weight loss with lifestyle modificationsin last 1year: low sodium/low fat/low carb diet and exercise 2x/week at the gym (cardio and strength training). Wt Readings from Last 3 Encounters:  01/07/22 230 lb 3.2 oz (104.4 kg)  11/20/21 220 lb (99.8 kg)  10/09/20 250 lb 12.8 oz (113.8 kg)  Hx of obstructive sleep apnea, prediabetes, hyperlipidemia and HTN.  Repeat hgbA1c today Sent wegovy rx

## 2022-01-11 ENCOUNTER — Emergency Department (HOSPITAL_BASED_OUTPATIENT_CLINIC_OR_DEPARTMENT_OTHER)
Admission: EM | Admit: 2022-01-11 | Discharge: 2022-01-11 | Disposition: A | Discharge status: Home / Self Care | Payer: Managed Care, Other (non HMO) | Attending: Student | Admitting: Student

## 2022-01-11 ENCOUNTER — Other Ambulatory Visit (HOSPITAL_COMMUNITY): Payer: Self-pay

## 2022-01-11 ENCOUNTER — Other Ambulatory Visit: Payer: Self-pay

## 2022-01-11 ENCOUNTER — Encounter (HOSPITAL_BASED_OUTPATIENT_CLINIC_OR_DEPARTMENT_OTHER): Payer: Self-pay | Admitting: Emergency Medicine

## 2022-01-11 ENCOUNTER — Emergency Department (HOSPITAL_BASED_OUTPATIENT_CLINIC_OR_DEPARTMENT_OTHER): Payer: Managed Care, Other (non HMO)

## 2022-01-11 ENCOUNTER — Encounter: Payer: Self-pay | Admitting: Nurse Practitioner

## 2022-01-11 DIAGNOSIS — Z79899 Other long term (current) drug therapy: Secondary | ICD-10-CM | POA: Diagnosis not present

## 2022-01-11 DIAGNOSIS — X500XXA Overexertion from strenuous movement or load, initial encounter: Secondary | ICD-10-CM | POA: Diagnosis not present

## 2022-01-11 DIAGNOSIS — Z87891 Personal history of nicotine dependence: Secondary | ICD-10-CM | POA: Diagnosis not present

## 2022-01-11 DIAGNOSIS — Y990 Civilian activity done for income or pay: Secondary | ICD-10-CM | POA: Insufficient documentation

## 2022-01-11 DIAGNOSIS — G8911 Acute pain due to trauma: Secondary | ICD-10-CM | POA: Insufficient documentation

## 2022-01-11 DIAGNOSIS — I10 Essential (primary) hypertension: Secondary | ICD-10-CM | POA: Diagnosis not present

## 2022-01-11 DIAGNOSIS — M25512 Pain in left shoulder: Secondary | ICD-10-CM | POA: Diagnosis not present

## 2022-01-11 MED ORDER — LIDOCAINE 5 % EX PTCH
1.0000 | MEDICATED_PATCH | CUTANEOUS | 0 refills | Status: DC
  Filled 2022-01-11: qty 30, 30d supply, fill #0

## 2022-01-11 MED ORDER — LIDOCAINE 5 % EX PTCH
1.0000 | MEDICATED_PATCH | CUTANEOUS | Status: DC
  Administered 2022-01-11: 1 via TRANSDERMAL
  Filled 2022-01-11: qty 1

## 2022-01-11 MED ORDER — KETOROLAC TROMETHAMINE 15 MG/ML IJ SOLN
15.0000 mg | Freq: Once | INTRAMUSCULAR | Status: AC
  Administered 2022-01-11: 15 mg via INTRAMUSCULAR
  Filled 2022-01-11: qty 1

## 2022-01-11 MED ORDER — NAPROXEN 375 MG PO TABS
375.0000 mg | ORAL_TABLET | Freq: Two times a day (BID) | ORAL | 0 refills | Status: DC | PRN
  Filled 2022-01-11: qty 20, 10d supply, fill #0

## 2022-01-11 MED ORDER — ACETAMINOPHEN 500 MG PO TABS
1000.0000 mg | ORAL_TABLET | Freq: Once | ORAL | Status: AC
  Administered 2022-01-11: 1000 mg via ORAL
  Filled 2022-01-11: qty 2

## 2022-01-11 MED ORDER — ACETAMINOPHEN 500 MG PO TABS
1000.0000 mg | ORAL_TABLET | Freq: Three times a day (TID) | ORAL | 0 refills | Status: AC
  Filled 2022-01-11: qty 180, 30d supply, fill #0

## 2022-01-11 NOTE — ED Triage Notes (Signed)
Pt to ER with c/o left arm pain for last several months.  Pt states pain is worse today.  States pain starts at shoulder and runs to back and down to elbow.  Movement increases pain.

## 2022-01-11 NOTE — ED Provider Notes (Signed)
[facility] [place] EMERGENCY DEPARTMENT Provider Note  CSN: [id number] Arrival date & time: 01/11/22 5643  Chief Complaint(s) Arm Pain  HPI GESELLE [name] is a 61 y.o. female with PMH HTN who presents emergency department for evaluation of left shoulder pain.  She states that approximately 7 years ago she was receiving steroid injections in the shoulder but has not required this medicine for many years.  She states that she has had intermittent shoulder and left arm pain for many months but that significantly worse today.  Worsened with movement and states that she is frequently lifting heavy objects at work.  Denies numbness, tingling, weakness of the extremity.  Denies associated chest pain, shortness of breath, abdominal pain, nausea, vomiting or other systemic symptoms.   Past Medical History Past Medical History:  Diagnosis Date   Allergy    allergic rhinitis   Anemia 06/2007   C-scope 06/2007: tics, neg hemocults 07/2007   Anxiety    Blood transfusion without reported diagnosis    at birth   Chest pain 12/2007   negative ECHO, neg stress test 06/2008   Condyloma acuminatum    history of   Hyperglycemia    borderline. 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   Elevated LDL cholesterol level 04/21/2018   OSA (obstructive sleep apnea) 03/13/2016   Hyperglycemia 01/12/2016   PAF (paroxysmal atrial fibrillation) ([place]) 11/16/2015   Esophageal reflux 10/16/2012   Periodic headache syndrome 08/21/2012   Class 3 severe obesity due to excess calories with serious comorbidity and body mass index (BMI) of 45.0 to 49.9 in adult ([place]) 12/20/2011   HOT FLASHES 04/27/2010   CHOLECYSTECTOMY, LAPAROSCOPIC, HX OF 08/09/2009   Allergic rhinitis 04/26/2008   Anemia 05/05/2007    CONDYLOMA ACUMINATUM 04/15/2007   Essential hypertension 07/03/2006   Home Medication(s) Prior to Admission medications   Medication Sig Start Date End Date Taking? Authorizing Provider  acetaminophen (TYLENOL) 500 MG tablet Take 2 tablets (1,000 mg total) by mouth every 8 (eight) hours. 01/11/22 02/10/22 Yes Macil Crady, MD  lidocaine (LIDODERM) 5 % Place 1 patch onto the skin daily. Remove & Discard patch within 12 hours or as directed by MD 01/11/22  Yes Elese Rane, MD  naproxen (NAPROSYN) 375 MG tablet Take 1 tablet (375 mg total) by mouth 2 (two) times daily as needed for moderate pain. 01/11/22  Yes Arrie Borrelli, MD  Albuterol Sulfate (PROAIR RESPICLICK) 329 (90 Base) MCG/ACT AEPB Inhale 1 puff into the lungs every 6 (six) hours as needed. 01/26/18   Nche, Charlene Brooke, NP  amLODipine (NORVASC) 10 MG tablet Take 1 tablet (10 mg total) by mouth at bedtime. 01/07/22 01/07/23  Nche, Charlene Brooke, NP  carvedilol (COREG) 25 MG tablet Take 1 tablet (25 mg total) by mouth 2 (two) times daily with a meal. 01/07/22   Nche, Charlene Brooke, NP  fluticasone (FLONASE) 50 MCG/ACT nasal spray PLACE 2 SPRAYS INTO BOTH NOSTRILS DAILY. 08/13/19 08/12/20  Nche, Charlene Brooke, NP  MAGNESIUM PO Take by mouth.     [provider]  Multiple Vitamin (MULTIVITAMIN WITH MINERALS) TABS tablet Take 1 tablet by mouth at bedtime. ALIVE multi-vitamin    [provider]  neomycin-polymyxin b-dexamethasone (MAXITROL) 3.5-10000-0.1  SUSP Instill 1 drop into right eye four times a day 01/25/21     olmesartan (BENICAR) 20 MG tablet Take 1 tablet (20 mg total) by mouth every morning. 01/07/22   Nche, Charlene Brooke, NP  OVER THE COUNTER MEDICATION Herbal Tea daily     [provider]  Semaglutide-Weight Management (WEGOVY) 1 MG/0.5ML SOAJ Inject 1 mg into the skin once a week. 01/07/22   Nche, Charlene Brooke, NP                                                                                                                                     Past Surgical History Past Surgical History:  Procedure Laterality Date   ABDOMINAL HYSTERECTOMY     no oophorectomy   BREAST EXCISIONAL BIOPSY     BREAST SURGERY  age 64   Fibroid tumor removed right breast   CARPAL TUNNEL RELEASE     CESAREAN SECTION     CHOLECYSTECTOMY     COLONOSCOPY     Family History Family History  Problem Relation Age of Onset   Breast cancer Paternal Aunt    Cervical cancer Cousin    Diabetes Mother    Coronary artery disease Father    Kidney disease Father    Diabetes Father    Hypertension Father    Prostate cancer Father    Coronary artery disease Sister    Kidney disease Sister    Diabetes Sister    Hypertension Sister    Kidney disease Brother    Stroke Brother    Hypertension Brother        x3   Diabetes Maternal Grandmother    Stroke Maternal Grandmother    Diabetes Paternal Grandmother    Stroke Paternal Grandmother    Coronary artery disease Other        grandmother, uncle   Kidney disease Other        grandmother   Stroke Other        uncle   Thyroid disease Other        aunts   Glaucoma Other        grandmother   Hypertension Other        grandmother   Diabetes Other        aunts   Hypothyroidism Other        aunt   Hyperthyroidism Other        aunt   Healthy Child    Healthy Child    Healthy Child    Colon cancer Neg Hx    Colon polyps Neg Hx    Esophageal cancer Neg Hx    Rectal cancer Neg Hx    Stomach cancer Neg Hx     Social History Social History   Tobacco Use   Smoking status: Former    Packs/day: 0.50    Years: 27.00    Total pack years: 13.50  Types: Cigarettes    Quit date: 02/12/2004    Years since quitting: 17.9   Smokeless tobacco: Never  Substance Use Topics   Alcohol use: No   Drug use: No   Allergies Ace inhibitors and Codeine  Review of Systems Review of Systems  Musculoskeletal:  Positive for arthralgias and myalgias.    Physical  Exam Vital Signs  I have reviewed the triage vital signs BP (!) 167/81   Pulse 71   Temp 97.7 F (36.5 C)   Resp 18   Ht '5\' 2"'$  (1.575 m)   Wt 104.3 kg   SpO2 98%   BMI 42.07 kg/m   Physical Exam Vitals and nursing note reviewed.  Constitutional:      General: She is not in acute distress.    Appearance: She is well-developed.  HENT:     Head: Normocephalic and atraumatic.  Eyes:     Conjunctiva/sclera: Conjunctivae normal.  Cardiovascular:     Rate and Rhythm: Normal rate and regular rhythm.     Heart sounds: No murmur heard. Pulmonary:     Effort: Pulmonary effort is normal. No respiratory distress.     Breath sounds: Normal breath sounds.  Abdominal:     Palpations: Abdomen is soft.     Tenderness: There is no abdominal tenderness.  Musculoskeletal:        General: Tenderness present. No swelling.     Cervical back: Neck supple.  Skin:    General: Skin is warm and dry.     Capillary Refill: Capillary refill takes less than 2 seconds.  Neurological:     Mental Status: She is alert.  Psychiatric:        Mood and Affect: Mood normal.     ED Results and Treatments Labs (all labs ordered are listed, but only abnormal results are displayed) Labs Reviewed - No data to display                                                                                                                        Radiology DG Shoulder Left  Result Date: 01/11/2022 CLINICAL DATA:  Several month history of left arm pain EXAM: LEFT SHOULDER - 3 VIEW COMPARISON:  Left shoulder radiographs dated 09/27/2011 FINDINGS: There is no evidence of fracture or dislocation. Degenerative changes of the glenohumeral joint with joint space narrowing and osteophyte formation. Subchondral cysts are seen along the glenohumeral joint. Soft tissue density spanning 5 cm overlying the lateral proximal humerus. IMPRESSION: 1. No acute fracture or dislocation. 2. Degenerative changes of the glenohumeral joint. 3.  Soft tissue density spanning 5 cm overlying the lateral proximal humerus, likely superimposition of structures. Correlate with physical examination in this area. Electronically Signed   By: Darrin Nipper M.D.   On: 01/11/2022 09:55    Pertinent labs & imaging results that were available during my care of the patient were reviewed by me and considered in my medical decision making (see MDM for details).  Medications  Ordered in ED Medications  lidocaine (LIDODERM) 5 % 1 patch (1 patch Transdermal Patch Applied 01/11/22 0921)  ketorolac (TORADOL) 15 MG/ML injection 15 mg (15 mg Intramuscular Given 01/11/22 0922)  acetaminophen (TYLENOL) tablet 1,000 mg (1,000 mg Oral Given 01/11/22 2703)                                                                                                                                     Procedures Procedures  (including critical care time)  Medical Decision Making / ED Course   This patient presents to the ED for concern of left shoulder pain, this involves an extensive number of treatment options, and is a complaint that carries with it a high risk of complications and morbidity.  The differential diagnosis includes arthritis, rotator cuff injury, adhesive capsulitis, fracture, mass  MDM: Patient seen emerged part for evaluation of shoulder pain.  With tenderness at the left shoulder along multiple muscle groups in the rotator cuff, pain significantly worse with empty soda can test against resistance.  X-ray with glenohumeral joint arthritis and degeneration but no fracture.  X-ray does indicate possible soft tissue mass but on physical exam this is not present and suspect this is superimposition of structures.  Patient pain significant proved with Toradol, Tylenol and lidocaine patch.  Patient will be provided a shoulder sling, Tylenol with naproxen for breakthrough pain.  Patient provided resources to follow-up outpatient with orthopedics and was discharged.  Of note,  very low suspicion for underlying cardiac pathology as the source of her shoulder pain given no chest pain, shortness of breath or any exertional symptoms.  Her symptoms appear to be musculoskeletal in nature today given physical exam.   Additional history obtained:  -External records from outside source obtained and reviewed including: Chart review including previous notes, labs, imaging, consultation notes   Imaging Studies ordered: I ordered imaging studies including x-ray shoulder I independently visualized and interpreted imaging. I agree with the radiologist interpretation   Medicines ordered and prescription drug management: Meds ordered this encounter  Medications   ketorolac (TORADOL) 15 MG/ML injection 15 mg   lidocaine (LIDODERM) 5 % 1 patch   acetaminophen (TYLENOL) tablet 1,000 mg   acetaminophen (TYLENOL) 500 MG tablet    Sig: Take 2 tablets (1,000 mg total) by mouth every 8 (eight) hours.    Dispense:  180 tablet    Refill:  0   lidocaine (LIDODERM) 5 %    Sig: Place 1 patch onto the skin daily. Remove & Discard patch within 12 hours or as directed by MD    Dispense:  30 patch    Refill:  0   naproxen (NAPROSYN) 375 MG tablet    Sig: Take 1 tablet (375 mg total) by mouth 2 (two) times daily as needed for moderate pain.    Dispense:  20 tablet    Refill:  0    -I have reviewed  the patients home medicines and have made adjustments as needed  Critical interventions none    Cardiac Monitoring: The patient was maintained on a cardiac monitor.  I personally viewed and interpreted the cardiac monitored which showed an underlying rhythm of: NSR  Social Determinants of Health:  Factors impacting patients care include: none   Reevaluation: After the interventions noted above, I reevaluated the patient and found that they have :improved  Co morbidities that complicate the patient evaluation  Past Medical History:  Diagnosis Date   Allergy    allergic  rhinitis   Anemia 06/2007   C-scope 06/2007: tics, neg hemocults 07/2007   Anxiety    Blood transfusion without reported diagnosis    at birth   Chest pain 12/2007   negative ECHO, neg stress test 06/2008   Condyloma acuminatum    history of   Hyperglycemia    borderline. A1c 5.7  12/2007   Hypertension    Personal history of atrial fibrillation    on carvedilol only    Sleep apnea    wears cpap at night      Dispostion: I considered admission for this patient, but she does not meet inpatient criteria for admission and is safe for discharge to outpatient follow-up.     Final Clinical Impression(s) / ED Diagnoses Final diagnoses:  Acute pain of left shoulder     '@PCDICTATION'$ @    Verlin Uher, Debe Coder, MD 01/11/22 1026

## 2022-01-14 ENCOUNTER — Other Ambulatory Visit (HOSPITAL_COMMUNITY): Payer: Self-pay

## 2022-01-15 ENCOUNTER — Ambulatory Visit
Admission: RE | Admit: 2022-01-15 | Discharge: 2022-01-15 | Disposition: A | Discharge status: Home / Self Care | Payer: Managed Care, Other (non HMO) | Source: Ambulatory Visit | Attending: Nurse Practitioner | Admitting: Nurse Practitioner

## 2022-01-15 DIAGNOSIS — Z1231 Encounter for screening mammogram for malignant neoplasm of breast: Secondary | ICD-10-CM

## 2022-01-25 ENCOUNTER — Other Ambulatory Visit (HOSPITAL_COMMUNITY): Payer: Self-pay

## 2022-01-30 ENCOUNTER — Other Ambulatory Visit (HOSPITAL_COMMUNITY): Payer: Self-pay

## 2022-01-30 ENCOUNTER — Encounter: Payer: Self-pay | Admitting: Nurse Practitioner

## 2022-01-30 ENCOUNTER — Ambulatory Visit (INDEPENDENT_AMBULATORY_CARE_PROVIDER_SITE_OTHER): Payer: Managed Care, Other (non HMO) | Admitting: Nurse Practitioner

## 2022-01-30 VITALS — BP 150/80 | HR 69 | Temp 96.8°F | Ht 62.0 in | Wt 227.6 lb

## 2022-01-30 DIAGNOSIS — I10 Essential (primary) hypertension: Secondary | ICD-10-CM

## 2022-01-30 MED ORDER — OLMESARTAN MEDOXOMIL-HCTZ 40-12.5 MG PO TABS
1.0000 | ORAL_TABLET | Freq: Every day | ORAL | 1 refills | Status: DC
  Filled 2022-01-30: qty 30, 30d supply, fill #0
  Filled 2022-03-01: qty 30, 30d supply, fill #1
  Filled 2022-04-03: qty 30, 30d supply, fill #2
  Filled 2022-05-10: qty 30, 30d supply, fill #3
  Filled 2022-06-13: qty 30, 30d supply, fill #4

## 2022-01-30 NOTE — Patient Instructions (Signed)
Stop olmesartan '20mg'$  Start olmesartan/hctz '40mg'$ /12.'5mg'$  1tab daily Maintain DASH diet and use CPAP machine. Continue to monitor BP in AM 3x/week Bring BP machine to next appt

## 2022-01-30 NOTE — Assessment & Plan Note (Signed)
Home BP 140s-200s/80s-100s Asymptomatic BP Readings from Last 3 Encounters:  01/30/22 126/86  01/11/22 (!) 167/81  01/07/22 (!) 180/100    Stop olmesartan '20mg'$  Start olmesartan/hctz '40mg'$ /12.'5mg'$  1tab daily Maintain DASH diet and use CPAP machine. Continue to monitor BP in AM 3x/week Bring BP machine to next appt F/up in 64month

## 2022-01-30 NOTE — Progress Notes (Signed)
Established Patient Visit  Patient: Jennifer [name]   DOB: 12/26/1960   61 y.o. Female  MRN: [id number] Visit Date: 01/30/2022  Subjective:    Chief Complaint  Patient presents with   Office Visit    HTN Tries to check Bp daily   HPI Essential hypertension Home BP 140s-200s/80s-100s Asymptomatic BP Readings from Last 3 Encounters:  01/30/22 126/86  01/11/22 (!) 167/81  01/07/22 (!) 180/100    Stop olmesartan '20mg'$  Start olmesartan/hctz '40mg'$ /12.'5mg'$  1tab daily Maintain DASH diet and use CPAP machine. Continue to monitor BP in AM 3x/week Bring BP machine to next appt F/up in 30month Reviewed medical, surgical, and social history today  Medications: Outpatient Medications Prior to Visit  Medication Sig   Albuterol Sulfate (PROAIR RESPICLICK) 1762(90 Base) MCG/ACT AEPB Inhale 1 puff into the lungs every 6 (six) hours as needed.   amLODipine (NORVASC) 10 MG tablet Take 1 tablet (10 mg total) by mouth at bedtime.   carvedilol (COREG) 25 MG tablet Take 1 tablet (25 mg total) by mouth 2 (two) times daily with a meal.   lidocaine (LIDODERM) 5 % Place 1 patch onto the skin daily. Remove & Discard patch within 12 hours or as directed by MD   MAGNESIUM PO Take by mouth.    Multiple Vitamin (MULTIVITAMIN WITH MINERALS) TABS tablet Take 1 tablet by mouth at bedtime. ALIVE multi-vitamin   naproxen (NAPROSYN) 375 MG tablet Take 1 tablet (375 mg total) by mouth 2 (two) times daily as needed for moderate pain.   neomycin-polymyxin b-dexamethasone (MAXITROL) 3.5-10000-0.1 SUSP Instill 1 drop into right eye four times a day   OVER THE COUNTER MEDICATION Herbal Tea daily    [DISCONTINUED] olmesartan (BENICAR) 20 MG tablet Take 1 tablet (20 mg total) by mouth every morning.   acetaminophen (TYLENOL) 500 MG tablet Take 2 tablets (1,000 mg total) by mouth every 8 (eight) hours. (Patient not taking: Reported on 01/30/2022)   fluticasone (FLONASE) 50 MCG/ACT nasal spray PLACE 2  SPRAYS INTO BOTH NOSTRILS DAILY.   Semaglutide-Weight Management (WEGOVY) 1 MG/0.5ML SOAJ Inject 1 mg into the skin once a week. (Patient not taking: Reported on 01/30/2022)   No facility-administered medications prior to visit.   Reviewed past medical and social history.   ROS per HPI above  Last metabolic panel Lab Results  Component Value Date   GLUCOSE 106 (H) 11/20/2021   NA 139 11/20/2021   K 3.6 11/20/2021   CL 106 11/20/2021   CO2 25 11/20/2021   BUN 14 11/20/2021   CREATININE 0.67 11/20/2021   GFRNONAA >60 11/20/2021   CALCIUM 8.8 (L) 11/20/2021   PROT 7.4 04/10/2020   ALBUMIN 4.2 04/10/2020   BILITOT 0.7 04/10/2020   ALKPHOS 66 04/10/2020   AST 14 04/10/2020   ALT 12 04/10/2020   ANIONGAP 8 11/20/2021   Last thyroid functions Lab Results  Component Value Date   TSH 1.73 10/09/2020   T4TOTAL 8.4 12/12/2007      Objective:  BP (!) 150/80   Pulse 69   Temp (!) 96.8 F (36 C) (Temporal)   Ht '5\' 2"'$  (1.575 m)   Wt 227 lb 9.6 oz (103.2 kg)   SpO2 99%   BMI 41.63 kg/m      Physical Exam Constitutional:      Appearance: She is obese.  Cardiovascular:     Rate and Rhythm: Normal rate and regular rhythm.  Pulses: Normal pulses.     Heart sounds: Normal heart sounds.  Pulmonary:     Effort: Pulmonary effort is normal.     Breath sounds: Normal breath sounds.  Musculoskeletal:     Right lower leg: Edema present.     Left lower leg: Edema present.  Neurological:     Mental Status: She is alert and oriented to person, place, and time.     No results found for any visits on 01/30/22.    Assessment & Plan:    Problem List Items Addressed This Visit       Cardiovascular and Mediastinum   Essential hypertension - Primary    Home BP 140s-200s/80s-100s Asymptomatic BP Readings from Last 3 Encounters:  01/30/22 126/86  01/11/22 (!) 167/81  01/07/22 (!) 180/100    Stop olmesartan '20mg'$  Start olmesartan/hctz '40mg'$ /12.'5mg'$  1tab daily Maintain DASH  diet and use CPAP machine. Continue to monitor BP in AM 3x/week Bring BP machine to next appt F/up in 60month     Relevant Medications   olmesartan-hydrochlorothiazide (BENICAR HCT) 40-12.5 MG tablet   Return in about 4 weeks (around 02/27/2022) for HTN (repeat BMP).     CWilfred Lacy NP

## 2022-01-31 ENCOUNTER — Other Ambulatory Visit: Payer: Self-pay

## 2022-01-31 ENCOUNTER — Other Ambulatory Visit (HOSPITAL_COMMUNITY): Payer: Self-pay

## 2022-01-31 MED ORDER — ZEPBOUND 2.5 MG/0.5ML ~~LOC~~ SOAJ
2.5000 mg | SUBCUTANEOUS | 0 refills | Status: DC
  Filled 2022-01-31: qty 2, 28d supply, fill #0

## 2022-02-23 ENCOUNTER — Other Ambulatory Visit (HOSPITAL_COMMUNITY): Payer: Self-pay

## 2022-02-25 ENCOUNTER — Other Ambulatory Visit (HOSPITAL_COMMUNITY): Payer: Self-pay

## 2022-02-28 ENCOUNTER — Encounter: Payer: Self-pay | Admitting: Nurse Practitioner

## 2022-02-28 ENCOUNTER — Ambulatory Visit (INDEPENDENT_AMBULATORY_CARE_PROVIDER_SITE_OTHER): Payer: Managed Care, Other (non HMO) | Admitting: Nurse Practitioner

## 2022-02-28 VITALS — BP 130/69 | HR 55 | Temp 96.0°F | Ht 62.0 in | Wt 227.0 lb

## 2022-02-28 DIAGNOSIS — Z6841 Body Mass Index (BMI) 40.0 and over, adult: Secondary | ICD-10-CM

## 2022-02-28 DIAGNOSIS — I480 Paroxysmal atrial fibrillation: Secondary | ICD-10-CM

## 2022-02-28 DIAGNOSIS — G4733 Obstructive sleep apnea (adult) (pediatric): Secondary | ICD-10-CM

## 2022-02-28 DIAGNOSIS — I10 Essential (primary) hypertension: Secondary | ICD-10-CM

## 2022-02-28 NOTE — Progress Notes (Signed)
Established Patient Visit  Patient: Jennifer [name]   DOB: [birth year]   62 y.o. Female  MRN: [id number] Visit Date: 02/28/2022  Subjective:    Chief Complaint  Patient presents with  . Office Visit    HTN  Checks BP twice a week No concerns    HPI Class 3 severe obesity due to excess calories with serious comorbidity and body mass index (BMI) of 45.0 to 49.9 in adult [facility]) Unable to afford wegovy injection. Encouraged to maintain heart healthy diet, small meal portions and daily exercise Wt Readings from Last 3 Encounters:  02/28/22 227 lb (103 kg)  01/30/22 227 lb 9.6 oz (103.2 kg)  01/11/22 230 lb (104.3 kg)     PAF (paroxysmal atrial fibrillation) (HCC) Denies any palpitations or syncope or chest pain. Advised about risk of PE/DVT/CVA/MI. Advised about correlation with uncontrolled OSA. Advised to use CPAP daily.  Essential hypertension Home BP: 138/84, 124/83, 110/75, 147/97, 139/91, 122/82. Compliant with benicar hct, amlodipine, and coreg BP Readings from Last 3 Encounters:  02/28/22 (!) 150/80  01/30/22 (!) 150/80  01/11/22 (!) 167/81    Possible white coat syndrome Advised to bring home BP machine for next appt. Maintain current medications  Reviewed medical, surgical, and social history today  Medications: Outpatient Medications Prior to Visit  Medication Sig  . Albuterol Sulfate (PROAIR RESPICLICK) 601 (90 Base) MCG/ACT AEPB Inhale 1 puff into the lungs every 6 (six) hours as needed.  Marland Kitchen amLODipine (NORVASC) 10 MG tablet Take 1 tablet (10 mg total) by mouth at bedtime.  . carvedilol (COREG) 25 MG tablet Take 1 tablet (25 mg total) by mouth 2 (two) times daily with a meal.  . lidocaine (LIDODERM) 5 % Place 1 patch onto the skin daily. Remove & Discard patch within 12 hours or as directed by MD  . MAGNESIUM PO Take by mouth.   . Multiple Vitamin (MULTIVITAMIN WITH MINERALS) TABS tablet Take 1 tablet by mouth at bedtime. ALIVE multi-vitamin   . naproxen (NAPROSYN) 375 MG tablet Take 1 tablet (375 mg total) by mouth 2 (two) times daily as needed for moderate pain.  Marland Kitchen neomycin-polymyxin b-dexamethasone (MAXITROL) 3.5-10000-0.1 SUSP Instill 1 drop into right eye four times a day  . olmesartan-hydrochlorothiazide (BENICAR HCT) 40-12.5 MG tablet Take 1 tablet by mouth daily.  Marland Kitchen OVER THE COUNTER MEDICATION Herbal Tea daily   . fluticasone (FLONASE) 50 MCG/ACT nasal spray PLACE 2 SPRAYS INTO BOTH NOSTRILS DAILY.  . [DISCONTINUED] Semaglutide-Weight Management (WEGOVY) 1 MG/0.5ML SOAJ Inject 1 mg into the skin once a week. (Patient not taking: Reported on 01/30/2022)  . [DISCONTINUED] tirzepatide (ZEPBOUND) 2.5 MG/0.5ML Pen Inject 2.5 mg into the skin once a week as directed (Patient not taking: Reported on 02/28/2022)   No facility-administered medications prior to visit.   Reviewed past medical and social history.   ROS per HPI above      Objective:  BP 130/69 Comment: average home BP readings  Pulse (!) 55   Temp (!) 96 F (35.6 C) (Temporal)   Ht '5\' 2"'$  (1.575 m)   Wt 227 lb (103 kg)   SpO2 97%   BMI 41.52 kg/m      Physical Exam Cardiovascular:     Rate and Rhythm: Normal rate and regular rhythm.     Pulses: Normal pulses.     Heart sounds: Normal heart sounds.  Pulmonary:     Effort: Pulmonary effort  is normal.     Breath sounds: Normal breath sounds.  Musculoskeletal:     Right lower leg: No edema.     Left lower leg: No edema.  Neurological:     Mental Status: She is alert and oriented to person, place, and time.    No results found for any visits on 02/28/22.    Assessment & Plan:    Problem List Items Addressed This Visit       Cardiovascular and Mediastinum   Essential hypertension    Home BP: 138/84, 124/83, 110/75, 147/97, 139/91, 122/82. Compliant with benicar hct, amlodipine, and coreg BP Readings from Last 3 Encounters:  02/28/22 (!) 150/80  01/30/22 (!) 150/80  01/11/22 (!) 167/81     Possible white coat syndrome Advised to bring home BP machine for next appt. Maintain current medications      PAF (paroxysmal atrial fibrillation) (HCC)    Denies any palpitations or syncope or chest pain. Advised about risk of PE/DVT/CVA/MI. Advised about correlation with uncontrolled OSA. Advised to use CPAP daily.        Respiratory   OSA (obstructive sleep apnea) - Primary   Relevant Orders   For home use only DME continuous positive airway pressure (CPAP)     Other   Class 3 severe obesity due to excess calories with serious comorbidity and body mass index (BMI) of 45.0 to 49.9 in adult [facility])    Unable to afford wegovy injection. Encouraged to maintain heart healthy diet, small meal portions and daily exercise Wt Readings from Last 3 Encounters:  02/28/22 227 lb (103 kg)  01/30/22 227 lb 9.6 oz (103.2 kg)  01/11/22 230 lb (104.3 kg)         Other Visit Diagnoses     Class 3 severe obesity due to excess calories with serious comorbidity and body mass index (BMI) of 40.0 to 44.9 in adult [facility])   (Chronic)        Return in about 3 months (around 05/30/2022) for HTN, hyperlipidemia (fasting).     Wilfred Lacy, NP

## 2022-02-28 NOTE — Assessment & Plan Note (Signed)
Denies any palpitations or syncope or chest pain. Advised about risk of PE/DVT/CVA/MI. Advised about correlation with uncontrolled OSA. Advised to use CPAP daily.

## 2022-02-28 NOTE — Assessment & Plan Note (Signed)
Unable to afford wegovy injection. Encouraged to maintain heart healthy diet, small meal portions and daily exercise Wt Readings from Last 3 Encounters:  02/28/22 227 lb (103 kg)  01/30/22 227 lb 9.6 oz (103.2 kg)  01/11/22 230 lb (104.3 kg)

## 2022-02-28 NOTE — Assessment & Plan Note (Signed)
Home BP: 138/84, 124/83, 110/75, 147/97, 139/91, 122/82. Compliant with benicar hct, amlodipine, and coreg BP Readings from Last 3 Encounters:  02/28/22 (!) 150/80  01/30/22 (!) 150/80  01/11/22 (!) 167/81    Possible white coat syndrome Advised to bring home BP machine for next appt. Maintain current medications

## 2022-02-28 NOTE — Patient Instructions (Signed)
Maintain current med doses Bring BP machine to next appt.

## 2022-03-04 ENCOUNTER — Other Ambulatory Visit (HOSPITAL_COMMUNITY): Payer: Self-pay

## 2022-05-31 ENCOUNTER — Ambulatory Visit: Payer: Managed Care, Other (non HMO) | Admitting: Nurse Practitioner

## 2022-06-21 ENCOUNTER — Other Ambulatory Visit (HOSPITAL_COMMUNITY): Payer: Self-pay

## 2022-06-21 ENCOUNTER — Ambulatory Visit: Payer: Managed Care, Other (non HMO) | Admitting: Nurse Practitioner

## 2022-06-21 ENCOUNTER — Encounter: Payer: Self-pay | Admitting: Nurse Practitioner

## 2022-06-21 VITALS — BP 120/86 | HR 73 | Temp 98.1°F | Resp 16 | Ht 62.0 in | Wt 235.4 lb

## 2022-06-21 DIAGNOSIS — I10 Essential (primary) hypertension: Secondary | ICD-10-CM

## 2022-06-21 DIAGNOSIS — E7800 Pure hypercholesterolemia, unspecified: Secondary | ICD-10-CM

## 2022-06-21 DIAGNOSIS — R739 Hyperglycemia, unspecified: Secondary | ICD-10-CM | POA: Diagnosis not present

## 2022-06-21 LAB — COMPREHENSIVE METABOLIC PANEL
ALT: 11 U/L (ref 0–35)
AST: 14 U/L (ref 0–37)
Albumin: 4.1 g/dL (ref 3.5–5.2)
Alkaline Phosphatase: 68 U/L (ref 39–117)
BUN: 14 mg/dL (ref 6–23)
CO2: 30 mEq/L (ref 19–32)
Calcium: 9.6 mg/dL (ref 8.4–10.5)
Chloride: 101 mEq/L (ref 96–112)
Creatinine, Ser: 0.76 mg/dL (ref 0.40–1.20)
GFR: 83.34 mL/min (ref >60.00)
Glucose, Bld: 85 mg/dL (ref 70–99)
Potassium: 3.7 mEq/L (ref 3.5–5.1)
Sodium: 141 mEq/L (ref 135–145)
Total Bilirubin: 0.6 mg/dL (ref 0.2–1.2)
Total Protein: 7.2 g/dL (ref 6.0–8.3)

## 2022-06-21 LAB — HEMOGLOBIN A1C: Hgb A1c MFr Bld: 5.6 % (ref 4.6–6.5)

## 2022-06-21 LAB — LIPID PANEL
Cholesterol: 185 mg/dL (ref 0–200)
HDL: 53.7 mg/dL (ref >39.00)
LDL Cholesterol: 114 mg/dL — ABNORMAL HIGH (ref 0–99)
NonHDL: 131.68
Total CHOL/HDL Ratio: 3
Triglycerides: 87 mg/dL (ref 0.0–149.0)
VLDL: 17.4 mg/dL (ref 0.0–40.0)

## 2022-06-21 MED ORDER — OLMESARTAN MEDOXOMIL-HCTZ 40-12.5 MG PO TABS
1.0000 | ORAL_TABLET | Freq: Every day | ORAL | 1 refills | Status: DC
  Filled 2022-06-21: qty 90, 90d supply, fill #0
  Filled 2022-07-11: qty 30, 30d supply, fill #0
  Filled 2022-08-14: qty 30, 30d supply, fill #1
  Filled 2022-09-16: qty 30, 30d supply, fill #2
  Filled 2022-10-21: qty 30, 30d supply, fill #3
  Filled 2022-11-14: qty 30, 30d supply, fill #4
  Filled 2022-12-20: qty 30, 30d supply, fill #5

## 2022-06-21 NOTE — Assessment & Plan Note (Signed)
Repeat hgbA1c Encouraged to maintain heart healthy diet and daily exercise

## 2022-06-21 NOTE — Assessment & Plan Note (Signed)
Repeat lipid panel ?

## 2022-06-21 NOTE — Progress Notes (Signed)
Established Patient Visit  Patient: Jennifer [name]   DOB: [birth year]   62 y.o. Female  MRN: [id number] Visit Date: 06/21/2022  Subjective:    Chief Complaint  Patient presents with   Medical Management of Chronic Issues    Fasting    HPI Essential hypertension Improved BP control with use of CPAP machine, med and diet compliance. BP Readings from Last 3 Encounters:  06/21/22 120/86  02/28/22 130/69  01/30/22 (!) 150/80    Maintain med dose Repeat CMP  Elevated LDL cholesterol level Repeat lipid panel  Hyperglycemia Repeat hgbA1c Encouraged to maintain heart healthy diet and daily exercise  BP Readings from Last 3 Encounters:  06/21/22 120/86  02/28/22 130/69  01/30/22 (!) 150/80    Wt Readings from Last 3 Encounters:  06/21/22 235 lb 6.4 oz (106.8 kg)  02/28/22 227 lb (103 kg)  01/30/22 227 lb 9.6 oz (103.2 kg)    Reviewed medical, surgical, and social history today  Medications: Outpatient Medications Prior to Visit  Medication Sig   Albuterol Sulfate (PROAIR RESPICLICK) 108 (90 Base) MCG/ACT AEPB Inhale 1 puff into the lungs every 6 (six) hours as needed.   amLODipine (NORVASC) 10 MG tablet Take 1 tablet (10 mg total) by mouth at bedtime.   carvedilol (COREG) 25 MG tablet Take 1 tablet (25 mg total) by mouth 2 (two) times daily with a meal.   lidocaine (LIDODERM) 5 % Place 1 patch onto the skin daily. Remove & Discard patch within 12 hours or as directed by MD   MAGNESIUM PO Take by mouth.    Multiple Vitamin (MULTIVITAMIN WITH MINERALS) TABS tablet Take 1 tablet by mouth at bedtime. ALIVE multi-vitamin   naproxen (NAPROSYN) 375 MG tablet Take 1 tablet (375 mg total) by mouth 2 (two) times daily as needed for moderate pain.   neomycin-polymyxin b-dexamethasone (MAXITROL) 3.5-10000-0.1 SUSP Instill 1 drop into right eye four times a day   OVER THE COUNTER MEDICATION Herbal Tea daily    [DISCONTINUED] olmesartan-hydrochlorothiazide (BENICAR HCT)  40-12.5 MG tablet Take 1 tablet by mouth daily.   fluticasone (FLONASE) 50 MCG/ACT nasal spray PLACE 2 SPRAYS INTO BOTH NOSTRILS DAILY.   No facility-administered medications prior to visit.   Reviewed past medical and social history.   ROS per HPI above      Objective:  BP 120/86   Pulse 73   Temp 98.1 F (36.7 C) (Temporal)   Resp 16   Ht 5\' 2"  (1.575 m)   Wt 235 lb 6.4 oz (106.8 kg)   SpO2 94%   BMI 43.06 kg/m      Physical Exam Cardiovascular:     Rate and Rhythm: Normal rate and regular rhythm.     Pulses: Normal pulses.     Heart sounds: Normal heart sounds.  Pulmonary:     Effort: Pulmonary effort is normal.     Breath sounds: Normal breath sounds.  Musculoskeletal:        General: No tenderness. Normal range of motion.     Comments: Bilateral ankle edema, no pain, no erythema  Skin:    Findings: No erythema or rash.  Neurological:     Mental Status: She is alert and oriented to person, place, and time.     No results found for any visits on 06/21/22.    Assessment & Plan:    Problem List Items Addressed This Visit  Cardiovascular and Mediastinum   Essential hypertension    Improved BP control with use of CPAP machine, med and diet compliance. BP Readings from Last 3 Encounters:  06/21/22 120/86  02/28/22 130/69  01/30/22 (!) 150/80    Maintain med dose Repeat CMP      Relevant Medications   olmesartan-hydrochlorothiazide (BENICAR HCT) 40-12.5 MG tablet   Other Relevant Orders   Comprehensive metabolic panel     Other   Elevated LDL cholesterol level    Repeat lipid panel      Relevant Orders   Lipid panel   Hyperglycemia - Primary    Repeat hgbA1c Encouraged to maintain heart healthy diet and daily exercise      Relevant Orders   Hemoglobin A1c   Return in about 6 months (around 12/22/2022) for CPE (fasting).     Alysia Penna, NP

## 2022-06-21 NOTE — Patient Instructions (Signed)
Go to lab Continue Heart healthy diet and daily exercise. Maintain current medications. 

## 2022-06-21 NOTE — Assessment & Plan Note (Signed)
Improved BP control with use of CPAP machine, med and diet compliance. BP Readings from Last 3 Encounters:  06/21/22 120/86  02/28/22 130/69  01/30/22 (!) 150/80    Maintain med dose Repeat CMP

## 2022-06-25 NOTE — Progress Notes (Signed)
Stable Follow instructions as discussed during office visit.

## 2022-07-11 ENCOUNTER — Other Ambulatory Visit (HOSPITAL_COMMUNITY): Payer: Self-pay

## 2022-11-15 ENCOUNTER — Other Ambulatory Visit (HOSPITAL_COMMUNITY): Payer: Self-pay

## 2022-11-27 ENCOUNTER — Other Ambulatory Visit (HOSPITAL_COMMUNITY): Payer: Self-pay

## 2022-11-27 MED ORDER — OFLOXACIN 0.3 % OP SOLN
1.0000 [drp] | Freq: Four times a day (QID) | OPHTHALMIC | 0 refills | Status: DC
  Filled 2022-11-27: qty 5, 25d supply, fill #0

## 2022-11-27 MED ORDER — PREDNISOLONE ACETATE 1 % OP SUSP
OPHTHALMIC | 0 refills | Status: DC
  Filled 2022-11-27: qty 5, 28d supply, fill #0
  Filled 2023-01-07: qty 5, 28d supply, fill #1

## 2022-12-04 ENCOUNTER — Other Ambulatory Visit (HOSPITAL_COMMUNITY): Payer: Self-pay

## 2022-12-05 ENCOUNTER — Other Ambulatory Visit (HOSPITAL_COMMUNITY): Payer: Self-pay

## 2022-12-05 MED ORDER — PREDNISOLONE ACETATE 1 % OP SUSP
1.0000 [drp] | OPHTHALMIC | 0 refills | Status: DC
  Filled 2022-12-05 – 2022-12-06 (×2): qty 5, 25d supply, fill #0

## 2022-12-06 ENCOUNTER — Other Ambulatory Visit (HOSPITAL_COMMUNITY): Payer: Self-pay

## 2022-12-10 ENCOUNTER — Other Ambulatory Visit: Payer: Self-pay | Admitting: Nurse Practitioner

## 2022-12-10 DIAGNOSIS — Z1231 Encounter for screening mammogram for malignant neoplasm of breast: Secondary | ICD-10-CM

## 2022-12-13 ENCOUNTER — Ambulatory Visit (INDEPENDENT_AMBULATORY_CARE_PROVIDER_SITE_OTHER): Payer: Managed Care, Other (non HMO) | Admitting: Family Medicine

## 2022-12-13 ENCOUNTER — Other Ambulatory Visit: Payer: Self-pay

## 2022-12-13 VITALS — BP 138/81 | Ht 62.0 in | Wt 240.0 lb

## 2022-12-13 DIAGNOSIS — M25512 Pain in left shoulder: Secondary | ICD-10-CM

## 2022-12-13 DIAGNOSIS — G8929 Other chronic pain: Secondary | ICD-10-CM | POA: Diagnosis not present

## 2022-12-13 DIAGNOSIS — M25511 Pain in right shoulder: Secondary | ICD-10-CM

## 2022-12-13 MED ORDER — METHYLPREDNISOLONE ACETATE 40 MG/ML IJ SUSP
40.0000 mg | Freq: Once | INTRAMUSCULAR | Status: AC
  Administered 2022-12-13: 40 mg via INTRA_ARTICULAR

## 2022-12-13 NOTE — Patient Instructions (Signed)
Physical Therapy referral: [facility] Located in: [facility] Address: [address] [place], [place], [place] [address] Phone: [phone]  Today you received an injection with a corticosteroid (aka: cortisone injection). This injection is usually done in response to pain and inflammation. There is some "numbing medicine" (Lidocaine) in the shot, so the injected area may be numb and feel really good for the next couple of hours. The numbing medicine usually wears off in 2-3 hours, and then your pain level may be back to where it was before the injection until the cortisone starts working.    The actually benefit from the steroid injection is usually noticed within 3-5 days, but may take up to 14 days. You may actually experience a small (as in 10%) INCREASE in pain in the first 24 hours---that is common.  Things to watch out for that you should contact us or a health care provider urgently would include: 1. Unusual (as in more than 10%) increase in pain 2. New fever > 101.5 3. New swelling or redness of the injected area. 4. Streaking of red lines around the area injected.  Do not hesitate to call or reach out with any questions or concerns.    Today you received an injection with corticosteroid. This injection is usually done in response to pain and inflammation. There is some "numbing" medicine also in the shot so the injected area may be numb and feel really good for the next couple of hours. The numbing medicine usually wears off in 2-3 hours though, and then your pain level will be right back where it was before the injection.  The actually benefit from the steroid injection is usually noticed in 2-7 days. You may actually experience a small (as in 10%) INCREASE in pain in the first 24 hours---that is common.  Things to watch out for that you should contact us or a health care provider urgently would include: 1. Unusual (as in more than 10%) increase in pain 2. New fever >  101.5 3. New swelling or redness of the injected area. 4. Streaking of red lines around the area injected.

## 2022-12-16 ENCOUNTER — Encounter: Payer: Self-pay | Admitting: Family Medicine

## 2022-12-16 NOTE — Progress Notes (Signed)
PCP: No primary care provider on file.  Chief Complaint: Bilateral shoulder pain Subjective:   HPI: Patient is a 62 y.o. female here for bilateral shoulder pain.  Patient states that she got an injection in her left shoulder a year ago at [facility] and states that she felt a little bit better after that.  Patient states that she has pain in bilateral upper shoulders and has difficulty lifting her shoulders above 90 degrees.  Patient states that her symptoms are slowly worsened over the past year.  Patient is hoping to get a steroid injection today as she felt like it helped her in the past.  Patient is not diabetic.  Patient has no other concerns at this time.  Past Medical History:  Diagnosis Date   Allergy    allergic rhinitis   Anemia 06/2007   C-scope 06/2007: tics, neg hemocults 07/2007   Anxiety    Blood transfusion without reported diagnosis    at birth   Chest pain 12/2007   negative ECHO, neg stress test 06/2008   Condyloma acuminatum    history of   Hyperglycemia    borderline. A1c 5.7  12/2007   Hypertension    Personal history of atrial fibrillation    on carvedilol only    Sleep apnea    wears cpap at night    Current Outpatient Medications on File Prior to Visit  Medication Sig Dispense Refill   Albuterol Sulfate (PROAIR RESPICLICK) 108 (90 Base) MCG/ACT AEPB Inhale 1 puff into the lungs every 6 (six) hours as needed. 1 each 0   amLODipine (NORVASC) 10 MG tablet Take 1 tablet (10 mg total) by mouth at bedtime. 90 tablet 3   carvedilol (COREG) 25 MG tablet Take 1 tablet (25 mg total) by mouth 2 (two) times daily with a meal. 180 tablet 3   fluticasone (FLONASE) 50 MCG/ACT nasal spray PLACE 2 SPRAYS INTO BOTH NOSTRILS DAILY. 16 g 5   lidocaine (LIDODERM) 5 % Place 1 patch onto the skin daily. Remove & Discard patch within 12 hours or as directed by MD 30 patch 0   MAGNESIUM PO Take by mouth.      Multiple Vitamin (MULTIVITAMIN WITH MINERALS) TABS tablet Take 1 tablet  by mouth at bedtime. ALIVE multi-vitamin     naproxen (NAPROSYN) 375 MG tablet Take 1 tablet (375 mg total) by mouth 2 (two) times daily as needed for moderate pain. 20 tablet 0   neomycin-polymyxin b-dexamethasone (MAXITROL) 3.5-10000-0.1 SUSP Instill 1 drop into right eye four times a day 5 mL 0   ofloxacin (OCUFLOX) 0.3 % ophthalmic solution Place 1 drop into the right eye 4 (four) times daily for 1 week. 5 mL 0   olmesartan-hydrochlorothiazide (BENICAR HCT) 40-12.5 MG tablet Take 1 tablet by mouth daily. 90 tablet 1   OVER THE COUNTER MEDICATION Herbal Tea daily      prednisoLONE acetate (PRED FORTE) 1 % ophthalmic suspension Apply 1 drop into right eye 4 times a day for 1 week. Then 1 drop into right eye 3 times a day for 1 week. Then 1 drop into right eye 2 times a day for 1 week. Then 1 drop into right eye daily for a week. 15 mL 0   prednisoLONE acetate (PRED FORTE) 1 % ophthalmic suspension Place 1 drop into affected eye. Start four times daily and taper down as directed. 5 mL 0   No current facility-administered medications on file prior to visit.    Past Surgical History:  Procedure Laterality Date   ABDOMINAL HYSTERECTOMY     no oophorectomy   BREAST EXCISIONAL BIOPSY     BREAST SURGERY  age 13   Fibroid tumor removed right breast   CARPAL TUNNEL RELEASE     CESAREAN SECTION     CHOLECYSTECTOMY     COLONOSCOPY      Allergies  Allergen Reactions   Ace Inhibitors Cough   Codeine Nausea And Vomiting    BP 138/81   Ht 5\' 2"  (1.575 m)   Wt 240 lb (108.9 kg)   BMI 43.90 kg/m      11/02/2019    1:31 PM 12/21/2019    3:55 PM  [facility]  Frequency of aerobic exercise (# of days/week) 0 0  Average time in minutes 0 0  Frequency of strengthening activities (# of days/week) 0 0        No data to display              Objective:  Physical Exam:  Gen: NAD, comfortable in exam room  Shoulder, Bilateral: No  skin changes, erythema, or  ecchymosis noted. No evidence of bony deformity, asymmetry, or muscle atrophy; No tenderness over long head of biceps (bicipital groove). Mild TTP at [facility] joint. Decreased range of motion with abduction, able to go about 80-90 degrees, Thumb to T12 without significant tenderness. Strength 4/5 throughout with abduction, 5/5 with INT/EXT rotation. No abnormal scapular function observed. Sensation to light touch intact. Peripheral pulses intact. DTR's .   Special Tests:   - Painful Arc present at 70 degrees degrees   - Empty can: POS   - Int/Ext Rotation test: NEG   - Gerber Lift-Off Test: NEG   - Crossarm Adduction test: NEG   - Hawkins: POS   - Neer test: POS   - O'brien's test: POS   - Yergason's: NEG   - Speeds test: NEG      Assessment & Plan:  1. 1. Chronic pain of both shoulders Patient's exam as well as previous x-rays are consistent with osteoarthritic changes.  Patient also has decreased range of motion in 1 plane which does appear to present like frozen shoulder.  Given patient's slow progression of the symptoms, as well as patient's previous benefit from steroid injection, we will go ahead with bilateral glenohumeral joint injections at this time. -Will have patient follow-up in 2 weeks, if patient has had significant benefit then no need for repeat injection and patient symptoms are likely related to arthritic changes.  If patient has minimal improvement can consider repeat injection for suspected frozen shoulder at that time.  Will also have patient follow-up with physical therapy in the meantime for maximum benefit. - [place] LIMITED JOINT SPACE STRUCTURES UP BILAT; Future - Ambulatory referral to Physical Therapy  US-guided glenohumeral joint injection, Left shoulder After discussion on risks/benefits/indications, informed written consent was obtained. A timeout was then performed. The patient was positioned lying lateral recumbent on examination table. The patient's shoulder was prepped  with betadine and multiple alcohol swabs and utilizing ultrasound guidance, the patient's glenohumeral joint was identified on ultrasound. Using ultrasound guidance a 22-gauge, 3.5 inch needle with a mixture of 7:1 cc's lidocaine 1% without epi::depomedrol 40mg /ml was directed from a lateral to medial direction via in-plane technique into the glenohumeral joint with visualization of appropriate spread of injectate into the joint. Patient tolerated the procedure well without immediate complications.   US-guided glenohumeral joint injection, Right shoulder After discussion on risks/benefits/indications,  informed written consent was obtained. A timeout was then performed. The patient was positioned lying lateral recumbent on examination table. The patient's shoulder was prepped with betadine and multiple alcohol swabs and utilizing ultrasound guidance, the patient's glenohumeral joint was identified on ultrasound. Using ultrasound guidance a 22-gauge, 3.5 inch needle with a mixture of 7:1 cc's lidocaine 1% without epi::depomedrol 40mg /ml was directed from a lateral to medial direction via in-plane technique into the glenohumeral joint with visualization of appropriate spread of injectate into the joint. Patient tolerated the procedure well without immediate complications.      Brenton Grills MD, PGY-4  Sports Medicine Fellow [facility]  Addendum:  Patient seen in the office with  fellow.  History, exam, plan of care were precepted with me.  I was present for entire procedure and assisted fellow with injections.  Darene Lamer, DO, CAQSM

## 2022-12-20 ENCOUNTER — Other Ambulatory Visit (HOSPITAL_COMMUNITY): Payer: Self-pay

## 2022-12-30 ENCOUNTER — Encounter: Payer: Managed Care, Other (non HMO) | Admitting: Nurse Practitioner

## 2023-01-03 ENCOUNTER — Encounter: Payer: Self-pay | Admitting: Family Medicine

## 2023-01-03 ENCOUNTER — Ambulatory Visit (INDEPENDENT_AMBULATORY_CARE_PROVIDER_SITE_OTHER): Payer: Managed Care, Other (non HMO) | Admitting: Family Medicine

## 2023-01-03 VITALS — BP 136/74 | Ht 62.0 in | Wt 240.0 lb

## 2023-01-03 DIAGNOSIS — M25512 Pain in left shoulder: Secondary | ICD-10-CM | POA: Diagnosis not present

## 2023-01-03 DIAGNOSIS — M25511 Pain in right shoulder: Secondary | ICD-10-CM

## 2023-01-03 DIAGNOSIS — G8929 Other chronic pain: Secondary | ICD-10-CM | POA: Diagnosis not present

## 2023-01-03 NOTE — Progress Notes (Addendum)
PCP: Anne Ng, NP  Chief Complaint: F/u bilateral shoulder pain Subjective:   HPI: Patient is a 62 y.o. female here for Bilateral shoulder pain follow-up.  Patient had a steroid injection approximately 3 weeks ago bilaterally, patient had her first session of PT this week and states that she found significant increase in range of motion.  Patient states that she still has some pain but feels like she is about 25% better.  Patient states the pain is worse on the right than left.  Patient still has difficulty with range of motion with abduction on the right side getting above 90 degrees. .  Past Medical History:  Diagnosis Date   Allergy    allergic rhinitis   Anemia 06/2007   C-scope 06/2007: tics, neg hemocults 07/2007   Anxiety    Blood transfusion without reported diagnosis    at birth   Chest pain 12/2007   negative ECHO, neg stress test 06/2008   Condyloma acuminatum    history of   Hyperglycemia    borderline. A1c 5.7  12/2007   Hypertension    Personal history of atrial fibrillation    on carvedilol only    Sleep apnea    wears cpap at night    Current Outpatient Medications on File Prior to Visit  Medication Sig Dispense Refill   Albuterol Sulfate (PROAIR RESPICLICK) 108 (90 Base) MCG/ACT AEPB Inhale 1 puff into the lungs every 6 (six) hours as needed. 1 each 0   amLODipine (NORVASC) 10 MG tablet Take 1 tablet (10 mg total) by mouth at bedtime. 90 tablet 3   carvedilol (COREG) 25 MG tablet Take 1 tablet (25 mg total) by mouth 2 (two) times daily with a meal. 180 tablet 3   fluticasone (FLONASE) 50 MCG/ACT nasal spray PLACE 2 SPRAYS INTO BOTH NOSTRILS DAILY. 16 g 5   lidocaine (LIDODERM) 5 % Place 1 patch onto the skin daily. Remove & Discard patch within 12 hours or as directed by MD 30 patch 0   MAGNESIUM PO Take by mouth.      Multiple Vitamin (MULTIVITAMIN WITH MINERALS) TABS tablet Take 1 tablet by mouth at bedtime. ALIVE multi-vitamin     naproxen (NAPROSYN)  375 MG tablet Take 1 tablet (375 mg total) by mouth 2 (two) times daily as needed for moderate pain. 20 tablet 0   neomycin-polymyxin b-dexamethasone (MAXITROL) 3.5-10000-0.1 SUSP Instill 1 drop into right eye four times a day 5 mL 0   ofloxacin (OCUFLOX) 0.3 % ophthalmic solution Place 1 drop into the right eye 4 (four) times daily for 1 week. 5 mL 0   olmesartan-hydrochlorothiazide (BENICAR HCT) 40-12.5 MG tablet Take 1 tablet by mouth daily. 90 tablet 1   OVER THE COUNTER MEDICATION Herbal Tea daily      prednisoLONE acetate (PRED FORTE) 1 % ophthalmic suspension Apply 1 drop into right eye 4 times a day for 1 week. Then 1 drop into right eye 3 times a day for 1 week. Then 1 drop into right eye 2 times a day for 1 week. Then 1 drop into right eye daily for a week. 15 mL 0   prednisoLONE acetate (PRED FORTE) 1 % ophthalmic suspension Place 1 drop into affected eye. Start four times daily and taper down as directed. 5 mL 0   No current facility-administered medications on file prior to visit.    Past Surgical History:  Procedure Laterality Date   ABDOMINAL HYSTERECTOMY     no oophorectomy  BREAST EXCISIONAL BIOPSY     BREAST SURGERY  age 70   Fibroid tumor removed right breast   CARPAL TUNNEL RELEASE     CESAREAN SECTION     CHOLECYSTECTOMY     COLONOSCOPY      Allergies  Allergen Reactions   Ace Inhibitors Cough   Codeine Nausea And Vomiting    BP 136/74   Ht 5\' 2"  (1.575 m)   Wt 240 lb (108.9 kg)   BMI 43.90 kg/m      11/02/2019    1:31 PM 12/21/2019    3:55 PM  [facility]  Frequency of aerobic exercise (# of days/week) 0 0  Average time in minutes 0 0  Frequency of strengthening activities (# of days/week) 0 0        No data to display              Objective:  Physical Exam:  Gen: NAD, comfortable in exam room  Shoulder, Bilateral. No  skin changes, erythema, or ecchymosis noted. No evidence of bony deformity, asymmetry, or  muscle atrophy; No tenderness over long head of biceps (bicipital groove). No TTP at [facility] joint. Patient has decreased range of motion with abduction bilaterally, patient is only able to AB duct on the right side to about 90 degrees, left side she can abduct to about 120.  Patient does have decree strength on the right side approximately 4 out of 5, strength on the left side has increased and appears to be 5 out of 5.  Sensation to light touch is intact.  Special Tests:   - Empty can: POS   - Int/Ext Rotation test: NEG   - Gerber Lift-Off Test: NEG   - Crossarm Adduction test: NEG   - Hawkins: NEG   - Neer test: POS     Assessment & Plan:  1. 1. Chronic pain of both shoulders - At this time, given the at this time, given patient has had 25% improvement with bilateral shoulder pain, we will go ahead and have patient do more physical therapy before retrialing a steroid injection.  Patient needs to work on increasing range of motion as well as strength, if there is no improvement or if improvement stops occurring then can consider reinjecting the right shoulder for possible adhesive capsulitis however patient has had good improvement so we will keep trying the PT prior to injection.  Patient to follow-up in approximately 1-1/2 months from now and can reevaluate at that time.  Can also consider doing ultrasound of the rotator cuff to evaluate for any other pathological issues.    Brenton Grills MD, PGY-4  Sports Medicine Fellow [facility]  Addendum:  Patient seen and examined in the office by fellow.   History, exam, plan of care were precepted with me.  Agree with findings as documented in fellow note above.  Darene Lamer, DO, CAQSM

## 2023-01-07 ENCOUNTER — Other Ambulatory Visit (HOSPITAL_COMMUNITY): Payer: Self-pay

## 2023-01-08 ENCOUNTER — Ambulatory Visit
Admission: RE | Admit: 2023-01-08 | Discharge: 2023-01-08 | Disposition: A | Discharge status: Home / Self Care | Payer: Managed Care, Other (non HMO) | Source: Ambulatory Visit | Attending: Nurse Practitioner | Admitting: Nurse Practitioner

## 2023-01-08 DIAGNOSIS — Z1231 Encounter for screening mammogram for malignant neoplasm of breast: Secondary | ICD-10-CM

## 2023-01-16 ENCOUNTER — Encounter: Payer: Self-pay | Admitting: Nurse Practitioner

## 2023-01-16 ENCOUNTER — Ambulatory Visit: Payer: Managed Care, Other (non HMO) | Admitting: Nurse Practitioner

## 2023-01-16 ENCOUNTER — Other Ambulatory Visit (HOSPITAL_COMMUNITY): Payer: Self-pay

## 2023-01-16 VITALS — BP 136/79 | HR 66 | Temp 98.3°F | Resp 18 | Ht 62.0 in | Wt 251.6 lb

## 2023-01-16 DIAGNOSIS — I10 Essential (primary) hypertension: Secondary | ICD-10-CM | POA: Diagnosis not present

## 2023-01-16 DIAGNOSIS — H35373 Puckering of macula, bilateral: Secondary | ICD-10-CM | POA: Insufficient documentation

## 2023-01-16 DIAGNOSIS — E66813 Obesity, class 3: Secondary | ICD-10-CM

## 2023-01-16 DIAGNOSIS — Z0001 Encounter for general adult medical examination with abnormal findings: Secondary | ICD-10-CM

## 2023-01-16 DIAGNOSIS — Z23 Encounter for immunization: Secondary | ICD-10-CM

## 2023-01-16 DIAGNOSIS — Z6841 Body Mass Index (BMI) 40.0 and over, adult: Secondary | ICD-10-CM

## 2023-01-16 MED ORDER — OLMESARTAN MEDOXOMIL-HCTZ 40-12.5 MG PO TABS
1.0000 | ORAL_TABLET | Freq: Every day | ORAL | 1 refills | Status: DC
  Filled 2023-01-16: qty 30, 30d supply, fill #0
  Filled 2023-02-20: qty 30, 30d supply, fill #1

## 2023-01-16 MED ORDER — AMLODIPINE BESYLATE 10 MG PO TABS
10.0000 mg | ORAL_TABLET | Freq: Every day | ORAL | 3 refills | Status: DC
  Filled 2023-01-16: qty 30, 30d supply, fill #0
  Filled 2023-02-20: qty 30, 30d supply, fill #1

## 2023-01-16 MED ORDER — CARVEDILOL 25 MG PO TABS
25.0000 mg | ORAL_TABLET | Freq: Two times a day (BID) | ORAL | 3 refills | Status: DC
  Filled 2023-01-16: qty 60, 30d supply, fill #0
  Filled 2023-02-20: qty 60, 30d supply, fill #1

## 2023-01-16 NOTE — Progress Notes (Signed)
Complete physical exam  Patient: Jennifer [name]   DOB: 15-Feb-1960   62 y.o. Female  MRN: [id number] Visit Date: 01/16/2023  Subjective:    Chief Complaint  Patient presents with   Annual Exam    PT is requesting refill for blood pressure medications and is due for shingles vaccine. Mammogram is schedule for 01/28/2023.    Jennifer [name] is a 62 y.o. female who presents today for a complete physical exam. She reports consuming a general diet.  No exercise regimen  She generally feels well. She reports sleeping well. She does not have additional problems to discuss today.  Vision:Yes Dental:No STD Screen:No She agreed to 2nd Shingrix vaccine.  BP Readings from Last 3 Encounters:  01/16/23 136/79  01/03/23 136/74  12/13/22 138/81   Wt Readings from Last 3 Encounters:  01/16/23 251 lb 9.6 oz (114.1 kg)  01/03/23 240 lb (108.9 kg)  12/13/22 240 lb (108.9 kg)   Most recent fall risk assessment:    01/16/2023    8:23 AM  Fall Risk   Falls in the past year? 0  Number falls in past yr: 0  Injury with Fall? 0  Risk for fall due to : No Fall Risks  Follow up Falls evaluation completed   Depression screen:Yes - No Depression Most recent depression screenings:    01/16/2023    8:27 AM 01/07/2022    2:41 PM  PHQ 2/9 Scores  PHQ - 2 Score 0 0  PHQ- 9 Score 2 1   HPI  Macular puckering, bilateral Managed by Dr. Allena Katz Scar tissue removed: 11/2022 and 01/2023  Essential hypertension BP at goal BP Readings from Last 3 Encounters:  01/16/23 136/79  01/03/23 136/74  12/13/22 138/81    Maintain med doses  Class 3 severe obesity due to excess calories with serious comorbidity and body mass index (BMI) of 45.0 to 49.9 in adult (HCC) Unable to afford GLP-1 injection. We discussed use of contrave. She declined use due to skipping meals. Encouraged to maintain heart healthy diet, small meal portions and daily exercise. Provided printed information on portion  size. Advised to use myfitnesspal. Wt Readings from Last 3 Encounters:  01/16/23 251 lb 9.6 oz (114.1 kg)  01/03/23 240 lb (108.9 kg)  12/13/22 240 lb (108.9 kg)     Past Medical History:  Diagnosis Date   Allergy    allergic rhinitis   Anemia 06/2007   C-scope 06/2007: tics, neg hemocults 07/2007   Anxiety    Blood transfusion without reported diagnosis    at birth   Chest pain 12/2007   negative ECHO, neg stress test 06/2008   Condyloma acuminatum    history of   Hyperglycemia    borderline. A1c 5.7  12/2007   Hypertension    Personal history of atrial fibrillation    on carvedilol only    Sleep apnea    wears cpap at night   Past Surgical History:  Procedure Laterality Date   ABDOMINAL HYSTERECTOMY     no oophorectomy   BREAST EXCISIONAL BIOPSY     BREAST SURGERY  age 76   Fibroid tumor removed right breast   CARPAL TUNNEL RELEASE     CESAREAN SECTION     CHOLECYSTECTOMY     COLONOSCOPY     Social History   Socioeconomic History   Marital status: Divorced    Spouse name: Not on file   Number of children: 3   Years of education: Not on  file   Highest education level: Not on file  Occupational History    Employer: [employer]  Tobacco Use   Smoking status: Former    Current packs/day: 0.00    Average packs/day: 0.5 packs/day for 27.0 years (13.5 ttl pk-yrs)    Types: Cigarettes    Start date: 02/11/1977    Quit date: 02/12/2004    Years since quitting: 18.9   Smokeless tobacco: Never  Substance and Sexual Activity   Alcohol use: No   Drug use: No   Sexual activity: Not Currently    Birth control/protection: Post-menopausal, Surgical    Comment: s/p hysterectomy  Other Topics Concern   Not on file  Social History Narrative   Regular exercise  No   Caffeine-- 1 daily   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   Divorced 2013   3 children (all with her)   Enjoys church   1 dog (1/2 poodle, 1/2 shitzu)    Social Determinants of Corporate investment banker Strain: Not on file  Food Insecurity: Not on file  Transportation Needs: Not on file  Physical Activity: Not on file  Stress: Not on file  Social Connections: Not on file  Intimate Partner Violence: Not on file   Family Status  Relation Name Status   Emelda Brothers  Deceased at age 64       breast cancer   Cousin  Deceased at age 52   Mother  Alive   Father  Alive   Sister  Alive   Brother  Alive   MGM  (Not Specified)   PGM  (Not Specified)   Other  (Not Specified)   Child  (Not Specified)   Child  (Not Specified)   Child  (Not Specified)   Neg Hx  (Not Specified)  No partnership data on file   Family History  Problem Relation Age of Onset   Breast cancer Paternal Aunt    Cervical cancer Cousin    Diabetes Mother    Coronary artery disease Father    Kidney disease Father    Diabetes Father    Hypertension Father    Prostate cancer Father    Coronary artery disease Sister    Kidney disease Sister    Diabetes Sister    Hypertension Sister    Kidney disease Brother    Stroke Brother    Hypertension Brother        x3   Diabetes Maternal Grandmother    Stroke Maternal Grandmother    Diabetes Paternal Grandmother    Stroke Paternal Grandmother    Coronary artery disease Other        grandmother, uncle   Kidney disease Other        grandmother   Stroke Other        uncle   Thyroid disease Other        aunts   Glaucoma Other        grandmother   Hypertension Other        grandmother   Diabetes Other        aunts   Hypothyroidism Other        aunt   Hyperthyroidism Other        aunt   Healthy Child    Healthy Child    Healthy Child    Colon cancer Neg Hx    Colon polyps Neg Hx    Esophageal cancer Neg Hx  Rectal cancer Neg Hx    Stomach cancer Neg Hx    Allergies  Allergen Reactions   Ace Inhibitors Cough   Codeine Nausea And Vomiting    Patient Care Team: Kaylianna Detert, Bonna Gains, NP as PCP -  General (Internal Medicine) Louis Meckel, MD (Inactive) as Consulting Physician (Gastroenterology)   Medications: Outpatient Medications Prior to Visit  Medication Sig   Albuterol Sulfate (PROAIR RESPICLICK) 108 (90 Base) MCG/ACT AEPB Inhale 1 puff into the lungs every 6 (six) hours as needed.   hydrOXYzine (ATARAX) 25 MG tablet Take by mouth.   lidocaine (LIDODERM) 5 % Place 1 patch onto the skin daily. Remove & Discard patch within 12 hours or as directed by MD   MAGNESIUM PO Take by mouth.    Multiple Vitamin (MULTIVITAMIN WITH MINERALS) TABS tablet Take 1 tablet by mouth at bedtime. ALIVE multi-vitamin   naproxen (NAPROSYN) 375 MG tablet Take 1 tablet (375 mg total) by mouth 2 (two) times daily as needed for moderate pain.   neomycin-polymyxin b-dexamethasone (MAXITROL) 3.5-10000-0.1 SUSP Instill 1 drop into right eye four times a day   ofloxacin (OCUFLOX) 0.3 % ophthalmic solution Place 1 drop into the right eye 4 (four) times daily for 1 week.   OVER THE COUNTER MEDICATION Herbal Tea daily    prednisoLONE acetate (PRED FORTE) 1 % ophthalmic suspension Apply 1 drop into right eye 4 times a day for 1 week. Then 1 drop into right eye 3 times a day for 1 week. Then 1 drop into right eye 2 times a day for 1 week. Then 1 drop into right eye daily for a week.   prednisoLONE acetate (PRED FORTE) 1 % ophthalmic suspension Place 1 drop into affected eye. Start four times daily and taper down as directed.   [DISCONTINUED] carvedilol (COREG) 25 MG tablet Take 1 tablet (25 mg total) by mouth 2 (two) times daily with a meal.   [DISCONTINUED] olmesartan-hydrochlorothiazide (BENICAR HCT) 40-12.5 MG tablet Take 1 tablet by mouth daily.   fluticasone (FLONASE) 50 MCG/ACT nasal spray PLACE 2 SPRAYS INTO BOTH NOSTRILS DAILY.   [DISCONTINUED] amLODipine (NORVASC) 10 MG tablet Take 1 tablet (10 mg total) by mouth at bedtime.   No facility-administered medications prior to visit.    Review of Systems   Constitutional:  Negative for activity change, appetite change and unexpected weight change.  Respiratory: Negative.    Cardiovascular: Negative.   Gastrointestinal: Negative.   Endocrine: Negative for cold intolerance and heat intolerance.  Genitourinary: Negative.   Musculoskeletal: Negative.   Skin: Negative.   Neurological: Negative.   Hematological: Negative.   Psychiatric/Behavioral:  Negative for behavioral problems, decreased concentration, dysphoric mood, hallucinations, self-injury, sleep disturbance and suicidal ideas. The patient is not nervous/anxious.    Last CBC Lab Results  Component Value Date   WBC 6.2 01/07/2022   HGB 12.4 01/07/2022   HCT 38.1 01/07/2022   MCV 89.0 01/07/2022   MCH 29.0 11/20/2021   RDW 14.0 01/07/2022   PLT 277.0 01/07/2022   Last metabolic panel Lab Results  Component Value Date   GLUCOSE 85 06/21/2022   NA 141 06/21/2022   K 3.7 06/21/2022   CL 101 06/21/2022   CO2 30 06/21/2022   BUN 14 06/21/2022   CREATININE 0.76 06/21/2022   GFR 83.34 06/21/2022   CALCIUM 9.6 06/21/2022   PROT 7.2 06/21/2022   ALBUMIN 4.1 06/21/2022   BILITOT 0.6 06/21/2022   ALKPHOS 68 06/21/2022   AST 14 06/21/2022  ALT 11 06/21/2022   ANIONGAP 8 11/20/2021   Last lipids Lab Results  Component Value Date   CHOL 185 06/21/2022   HDL 53.70 06/21/2022   LDLCALC 114 (H) 06/21/2022   TRIG 87.0 06/21/2022   CHOLHDL 3 06/21/2022   Last hemoglobin A1c Lab Results  Component Value Date   HGBA1C 5.6 06/21/2022      Objective:  BP 136/79 (BP Location: Left Arm, Patient Position: Sitting, Cuff Size: Large)   Pulse 66   Temp 98.3 F (36.8 C) (Temporal)   Resp 18   Ht 5\' 2"  (1.575 m)   Wt 251 lb 9.6 oz (114.1 kg)   SpO2 100%   BMI 46.02 kg/m     Physical Exam Vitals and nursing note reviewed.  Constitutional:      General: She is not in acute distress. HENT:     Right Ear: Tympanic membrane, ear canal and external ear normal.     Left Ear:  Tympanic membrane, ear canal and external ear normal.     Nose: Nose normal.  Eyes:     Extraocular Movements: Extraocular movements intact.     Conjunctiva/sclera: Conjunctivae normal.     Pupils: Pupils are equal, round, and reactive to light.  Neck:     Thyroid: No thyroid mass, thyromegaly or thyroid tenderness.  Cardiovascular:     Rate and Rhythm: Normal rate and regular rhythm.     Pulses: Normal pulses.     Heart sounds: Normal heart sounds.  Pulmonary:     Effort: Pulmonary effort is normal.     Breath sounds: Normal breath sounds.  Abdominal:     General: Bowel sounds are normal.     Palpations: Abdomen is soft.  Musculoskeletal:        General: Normal range of motion.     Cervical back: Normal range of motion and neck supple.     Right lower leg: No edema.     Left lower leg: No edema.  Lymphadenopathy:     Cervical: No cervical adenopathy.  Skin:    General: Skin is warm and dry.  Neurological:     Mental Status: She is alert and oriented to person, place, and time.     Cranial Nerves: No cranial nerve deficit.  Psychiatric:        Mood and Affect: Mood normal.        Behavior: Behavior normal.        Thought Content: Thought content normal.      No results found for any visits on 01/16/23.    Assessment & Plan:    Routine Health Maintenance and Physical Exam  Immunization History  Administered Date(s) Administered   Influenza,inj,Quad PF,6+ Mos 01/28/2014, 01/13/2015, 10/20/2015   Influenza-Unspecified 12/19/2011, 11/11/2012   PFIZER(Purple Top)SARS-COV-2 Vaccination 05/21/2019, 06/14/2019   Td 06/27/2006   Tdap 04/11/2017   Zoster Recombinant(Shingrix) 01/07/2022, 01/16/2023   Health Maintenance  Topic Date Due   COVID-19 Vaccine (3 - 2023-24 season) 02/01/2023 (Originally 10/13/2022)   INFLUENZA VACCINE  05/12/2023 (Originally 09/12/2022)   MAMMOGRAM  01/16/2024   DTaP/Tdap/Td (3 - Td or Tdap) 04/12/2027   Colonoscopy  12/27/2027   Hepatitis C  Screening  Completed   HIV Screening  Completed   Zoster Vaccines- Shingrix  Completed   HPV VACCINES  Aged Out   Discussed health benefits of physical activity, and encouraged her to engage in regular exercise appropriate for her age and condition.  Problem List Items Addressed This Visit  Class 3 severe obesity due to excess calories with serious comorbidity and body mass index (BMI) of 45.0 to 49.9 in adult [facility])    Unable to afford GLP-1 injection. We discussed use of contrave. She declined use due to skipping meals. Encouraged to maintain heart healthy diet, small meal portions and daily exercise. Provided printed information on portion size. Advised to use myfitnesspal. Wt Readings from Last 3 Encounters:  01/16/23 251 lb 9.6 oz (114.1 kg)  01/03/23 240 lb (108.9 kg)  12/13/22 240 lb (108.9 kg)         Essential hypertension    BP at goal BP Readings from Last 3 Encounters:  01/16/23 136/79  01/03/23 136/74  12/13/22 138/81    Maintain med doses      Relevant Medications   amLODipine (NORVASC) 10 MG tablet   carvedilol (COREG) 25 MG tablet   olmesartan-hydrochlorothiazide (BENICAR HCT) 40-12.5 MG tablet   Other Visit Diagnoses     Encounter for preventative adult health care exam with abnormal findings    -  Primary   Immunization due       Relevant Orders   Zoster Recombinant (Shingrix ) (Completed)      Return in about 3 months (around 04/16/2023) for HTN, prediabetes, Weight management, hyperlipidemia (fasting).     Alysia Penna, NP

## 2023-01-16 NOTE — Patient Instructions (Signed)
Maintain Heart healthy diet and daily exercise. Maintain current medications. Use myfitnesspal phone app to count daily caloric intake.  How to Increase Your Level of Physical Activity Getting regular physical activity is important for your overall health and well-being. Most people do not get enough exercise. There are easy ways to increase your level of physical activity, even if you have not been very active in the past or if you are just starting out. What are the benefits of physical activity? Physical activity has many short-term and long-term benefits. Being active on a regular basis can improve your physical and mental health as well as provide other benefits. Physical health benefits Helping you lose weight or maintain a healthy weight. Strengthening your muscles and bones. Reducing your risk of certain long-term (chronic) diseases, including heart disease, cancer, and diabetes. Being able to move around more easily and for longer periods of time without getting tired (increased endurance or stamina). Improving your ability to fight off illness (enhanced immunity). Being able to sleep better. Helping you stay healthy as you get older, including: Helping you stay mobile, or capable of walking and moving around. Preventing accidents, such as falls. Increasing life expectancy. Mental health benefits Boosting your mood and improving your self-esteem. Lowering your chance of having mental health problems, such as depression or anxiety. Helping you feel good about your body. Other benefits Finding new sources of fun and enjoyment. Meeting new people who share a common interest. Before you begin If you have a chronic illness or have not been active for a while, check with your health care provider about how to get started. Ask your health care provider what activities are safe for you. Start out slowly. Walking or doing some simple chair exercises is a good place to start, especially if  you have not been active before or for a long time. Set goals that you can work toward. Ask your health care provider how much exercise is best for you. In general, most adults should: Do moderate-intensity exercise for at least 150 minutes each week (30 minutes on most days of the week) or vigorous exercise for at least 75 minutes each week, or a combination of these. Moderate-intensity exercise can include walking at a quick pace, biking, yoga, water aerobics, or gardening. Vigorous exercise involves activities that take more effort, such as jogging or running, playing sports, swimming laps, or jumping rope. Do strength exercises on at least 2 days each week. This can include weight lifting, body weight exercises, and resistance-band exercises. How to be more physically active Make a plan  Try to find activities that you enjoy. You are more likely to commit to an exercise routine if it does not feel like a chore. If you have bone or joint problems, choose low-impact exercises, like walking or swimming. Use these tips for being successful with an exercise plan: Find a workout partner for accountability. Join a group or class, such as an aerobics class, cycling class, or sports team. Make family time active. Go for a walk, bike, or swim. Include a variety of exercises each week. Consider using a fitness tracker, such as a mobile phone app or a device worn like a watch, that will count the number of steps you take each day. Many people strive to reach 10,000 steps a day. Find ways to be active in your daily routines Besides your formal exercise plans, you can find ways to do physical activity during your daily routines, such as: Walking or biking to  work or to [employer]. Taking the stairs instead of the elevator. Parking farther away from the door at work or at the store. Planning walking meetings. Walking around while you are on the phone. Where to find more information Centers for Disease  Control and Prevention: CampusC[employer].com.pt President's Council on Fitness, Sports & Nutrition: www.fitness.gov ChooseMyPlate: http://www.harvey.com/ Contact a health care provider if: You have headaches, muscle aches, or joint pain that is concerning. You feel dizzy or light-headed while exercising. You faint. You feel your heart skipping, racing, or fluttering. You have chest pain while exercising. Summary Exercise benefits your mind and body at any age, even if you are just starting out. If you have a chronic illness or have not been active for a while, check with your health care provider before increasing your physical activity. Choose activities that are safe and enjoyable for you. Ask your health care provider what activities are safe for you. Start slowly. Tell your health care provider if you have problems as you start to increase your activity level. This information is not intended to replace advice given to you by your health care provider. Make sure you discuss any questions you have with your health care provider. Document Revised: 05/26/2020 Document Reviewed: 05/26/2020 Elsevier Patient Education  2024 [employer].

## 2023-01-16 NOTE — Assessment & Plan Note (Signed)
Managed by Dr. Allena Katz Scar tissue removed: 11/2022 and 01/2023

## 2023-01-16 NOTE — Assessment & Plan Note (Signed)
BP at goal BP Readings from Last 3 Encounters:  01/16/23 136/79  01/03/23 136/74  12/13/22 138/81    Maintain med doses

## 2023-01-16 NOTE — Assessment & Plan Note (Signed)
Unable to afford GLP-1 injection. We discussed use of contrave. She declined use due to skipping meals. Encouraged to maintain heart healthy diet, small meal portions and daily exercise. Provided printed information on portion size. Advised to use myfitnesspal. Wt Readings from Last 3 Encounters:  01/16/23 251 lb 9.6 oz (114.1 kg)  01/03/23 240 lb (108.9 kg)  12/13/22 240 lb (108.9 kg)

## 2023-01-21 ENCOUNTER — Other Ambulatory Visit (HOSPITAL_COMMUNITY): Payer: Self-pay

## 2023-01-22 ENCOUNTER — Other Ambulatory Visit (HOSPITAL_COMMUNITY): Payer: Self-pay

## 2023-01-22 MED ORDER — OFLOXACIN 0.3 % OP SOLN
1.0000 [drp] | Freq: Four times a day (QID) | OPHTHALMIC | 3 refills | Status: DC
  Filled 2023-01-22: qty 5, 7d supply, fill #0

## 2023-01-22 MED ORDER — PREDNISOLONE ACETATE 1 % OP SUSP
OPHTHALMIC | 0 refills | Status: AC
  Filled 2023-01-22: qty 5, 28d supply, fill #0

## 2023-01-28 ENCOUNTER — Ambulatory Visit
Admission: RE | Admit: 2023-01-28 | Discharge: 2023-01-28 | Disposition: A | Discharge status: Home / Self Care | Payer: Managed Care, Other (non HMO) | Source: Ambulatory Visit | Attending: Nurse Practitioner

## 2023-02-20 ENCOUNTER — Other Ambulatory Visit (HOSPITAL_COMMUNITY): Payer: Self-pay

## 2023-03-03 ENCOUNTER — Telehealth: Payer: Self-pay

## 2023-03-03 ENCOUNTER — Encounter: Payer: Self-pay | Admitting: Nurse Practitioner

## 2023-03-03 DIAGNOSIS — I10 Essential (primary) hypertension: Secondary | ICD-10-CM

## 2023-03-03 MED ORDER — CARVEDILOL 25 MG PO TABS: 25.0000 mg | ORAL_TABLET | Freq: Two times a day (BID) | ORAL | 0 refills | Status: DC

## 2023-03-03 MED ORDER — OLMESARTAN MEDOXOMIL-HCTZ 40-12.5 MG PO TABS: 1.0000 | ORAL_TABLET | Freq: Every day | ORAL | 0 refills | Status: DC

## 2023-03-03 NOTE — Telephone Encounter (Signed)
7 day supply sent to the pharmacy 

## 2023-04-01 ENCOUNTER — Encounter (HOSPITAL_COMMUNITY): Payer: Self-pay

## 2023-04-01 ENCOUNTER — Emergency Department (HOSPITAL_COMMUNITY)
Admission: EM | Admit: 2023-04-01 | Discharge: 2023-04-01 | Disposition: A | Discharge status: Home / Self Care | Payer: Managed Care, Other (non HMO) | Attending: Emergency Medicine | Admitting: Emergency Medicine

## 2023-04-01 ENCOUNTER — Other Ambulatory Visit: Payer: Self-pay

## 2023-04-01 DIAGNOSIS — R221 Localized swelling, mass and lump, neck: Secondary | ICD-10-CM | POA: Diagnosis present

## 2023-04-01 DIAGNOSIS — L239 Allergic contact dermatitis, unspecified cause: Secondary | ICD-10-CM | POA: Insufficient documentation

## 2023-04-01 MED ORDER — FAMOTIDINE 20 MG PO TABS
20.0000 mg | ORAL_TABLET | Freq: Once | ORAL | Status: AC
  Administered 2023-04-01: 20 mg via ORAL
  Filled 2023-04-01: qty 1

## 2023-04-01 MED ORDER — PREDNISONE 20 MG PO TABS
60.0000 mg | ORAL_TABLET | Freq: Once | ORAL | Status: AC
  Administered 2023-04-01: 60 mg via ORAL
  Filled 2023-04-01: qty 3

## 2023-04-01 NOTE — ED Provider Notes (Signed)
 [place] EMERGENCY DEPARTMENT AT [facility] Provider Note   CSN: [id number] Arrival date & time: 04/01/23  1538     History  Chief Complaint  Patient presents with   neck swelling    Jennifer [name] is a 63 y.o. female.  Patient comes to the ED with the feeling of throat swelling that started this morning. No SOB, rash, intraoral swelling. No new medications or foods. She does states she used a new perfume topically to the neck area this morning prior to onset of symptoms. At this time, she is asymptomatic.   The history is provided by the patient. No language interpreter was used.       Home Medications Prior to Admission medications   Medication Sig Start Date End Date Taking? Authorizing Provider  Albuterol Sulfate (PROAIR RESPICLICK) 108 (90 Base) MCG/ACT AEPB Inhale 1 puff into the lungs every 6 (six) hours as needed. 01/26/18   Nche, Bonna Gains, NP  amLODipine (NORVASC) 10 MG tablet Take 1 tablet (10 mg total) by mouth at bedtime. 01/16/23 01/16/24  Nche, Bonna Gains, NP  carvedilol (COREG) 25 MG tablet Take 1 tablet (25 mg total) by mouth 2 (two) times daily with a meal. 03/03/23   Nche, Bonna Gains, NP  fluticasone (FLONASE) 50 MCG/ACT nasal spray PLACE 2 SPRAYS INTO BOTH NOSTRILS DAILY. 08/13/19 08/12/20  NcheBonna Gains, NP  hydrOXYzine (ATARAX) 25 MG tablet Take by mouth. 08/04/22   [provider]  lidocaine (LIDODERM) 5 % Place 1 patch onto the skin daily. Remove & Discard patch within 12 hours or as directed by MD 01/11/22   Kommor, Wyn Forster, MD  MAGNESIUM PO Take by mouth.     [provider]  Multiple Vitamin (MULTIVITAMIN WITH MINERALS) TABS tablet Take 1 tablet by mouth at bedtime. ALIVE multi-vitamin    [provider]  naproxen (NAPROSYN) 375 MG tablet Take 1 tablet (375 mg total) by mouth 2 (two) times daily as needed for moderate pain. 01/11/22   Kommor, [place], MD  neomycin-polymyxin b-dexamethasone (MAXITROL)  3.5-10000-0.1 SUSP Instill 1 drop into right eye four times a day 01/25/21     ofloxacin (OCUFLOX) 0.3 % ophthalmic solution Place 1 drop into the left eye 4 (four) times daily for 7 days. 01/22/23     olmesartan-hydrochlorothiazide (BENICAR HCT) 40-12.5 MG tablet Take 1 tablet by mouth daily. 03/03/23   Nche, Bonna Gains, NP  OVER THE COUNTER MEDICATION Herbal Tea daily     [provider]  prednisoLONE acetate (PRED FORTE) 1 % ophthalmic suspension Apply 1 drop into right eye 4 times a day for 1 week. Then 1 drop into right eye 3 times a day for 1 week. Then 1 drop into right eye 2 times a day for 1 week. Then 1 drop into right eye daily for a week. 11/27/22   Carmela Rima, MD  prednisoLONE acetate (PRED FORTE) 1 % ophthalmic suspension Place 1 drop into affected eye. Start four times daily and taper down as directed. 12/04/22   Carmela Rima, MD      Allergies    Ace inhibitors and Codeine    Review of Systems   Review of Systems  Physical Exam Updated Vital Signs BP 132/88 (BP Location: Right Arm)   Pulse 67   Temp 98.4 F (36.9 C) (Oral)   Resp 17   Ht 5\' 2"  (1.575 m)   Wt 114.1 kg   SpO2 98%   BMI 46.01 kg/m  Physical Exam Constitutional:  General: She is not in acute distress.    Appearance: She is well-developed. She is not ill-appearing.  HENT:     Mouth/Throat:     Mouth: Mucous membranes are moist.  Neck:     Comments: No swelling, redness or tenderness of the neck.  Cardiovascular:     Rate and Rhythm: Normal rate.  Pulmonary:     Effort: Pulmonary effort is normal.  Musculoskeletal:        General: Normal range of motion.     Cervical back: Normal range of motion.  Skin:    General: Skin is warm and dry.     Findings: No erythema or rash.  Neurological:     Mental Status: She is alert and oriented to person, place, and time.     ED Results / Procedures / Treatments   Labs (all labs ordered are listed, but only abnormal results are  displayed) Labs Reviewed - No data to display  EKG None  Radiology No results found.  Procedures Procedures    Medications Ordered in ED Medications  famotidine (PEPCID) tablet 20 mg (20 mg Oral Given 04/01/23 1702)  predniSONE (DELTASONE) tablet 60 mg (60 mg Oral Given 04/01/23 1702)    ED Course/ Medical Decision Making/ A&P Clinical Course as of 04/01/23 2024  Tue Apr 01, 2023  2023 Likely contact allergic reaction to perfume used earlier today. Asymptomatic now. Requesting discharge home and feel this is appropriate. Advised no medications for now - stop using perfume. Return if symptoms recur.  [SU]    Clinical Course User Index [SU] Elpidio Anis, PA-C                                 Medical Decision Making          Final Clinical Impression(s) / ED Diagnoses Final diagnoses:  Contact allergic reaction    Rx / DC Orders ED Discharge Orders     None         Danne Harbor 04/01/23 2024    Lonell Grandchild, MD 04/02/23 [phone]

## 2023-04-01 NOTE — ED Notes (Signed)
Dc instructions reviewed with pt no questions or concerns at this time. Will follow up as needed.  

## 2023-04-01 NOTE — ED Provider Triage Note (Signed)
 Emergency Medicine Provider Triage Evaluation Note  Jennifer [name] , a 63 y.o. female  was evaluated in triage.  Pt complains of neck swelling.  Started this morning.  Patient states that she did start using a new perfume this morning but denies any other new exposures to medications, fabrics lotions or fragrances.  States she thought she was having trouble breathing at first.  Also endorsing pain that ran down her right and left arm and both of her legs.  Symptoms overall have improved.  No more pain in her extremities but states her throat feels like it still swollen.  Review of Systems  Positive: See above Negative: See above  Physical Exam  BP (!) 140/77   Pulse 74   Temp 98.4 F (36.9 C) (Oral)   Resp 17   Ht 5\' 2"  (1.575 m)   Wt 114.1 kg   SpO2 98%   BMI 46.01 kg/m  Gen:   Awake, no distress   Resp:  Normal effort  MSK:   Moves extremities without difficulty  Other:    Medical Decision Making  Medically screening exam initiated at 4:22 PM.  Appropriate orders placed.  Jennifer [name] was informed that the remainder of the evaluation will be completed by another provider, this initial triage assessment does not replace that evaluation, and the importance of remaining in the ED until their evaluation is complete.  Work up started   [employer], PA-C 04/01/23 1623

## 2023-04-01 NOTE — Discharge Instructions (Signed)
 Do not use the perfume used earlier today. This was likely the cause of your symptoms.   If you have any recurrent feeling of swelling or fullness in the neck again, return to the ED.

## 2023-04-01 NOTE — ED Triage Notes (Signed)
 Pt states this morning it felt like her neck was swelling on her way to work and had some SOB. Pt states that has resolved. Pt states this afternoon she had pain going down arms bilat and legs bilat, but has resolved. Pt states she came to make sure she's not having a heart attack.

## 2023-04-02 ENCOUNTER — Telehealth: Payer: Self-pay

## 2023-04-02 NOTE — Transitions of Care (Post Inpatient/ED Visit) (Signed)
   04/02/2023  Name: BRAXTYN DORFF MRN: [id number] DOB: 03-Mar-1960  Today's TOC FU Call Status: Today's TOC FU Call Status:: Unsuccessful Call (1st Attempt) Unsuccessful Call (1st Attempt) Date: 04/02/23  Attempted to reach the patient regarding the most recent Inpatient/ED visit.  Follow Up Plan: Additional outreach attempts will be made to reach the patient to complete the Transitions of Care (Post Inpatient/ED visit) call.   Signature Arvil Persons, BSN, Charity fundraiser

## 2023-04-02 NOTE — Telephone Encounter (Signed)
 Copied from CRM [id number]. Topic: General - Call Back - No Documentation >> Apr 02, 2023  3:10 PM Jennifer [name] wrote: Reason for CRM: Patient returning call to [facility]. Per note additional follow ups will be made. No number listed. Thank You

## 2023-04-03 NOTE — Transitions of Care (Post Inpatient/ED Visit) (Signed)
   04/03/2023  Name: Jennifer [name] MRN: [id number] DOB: 1960/09/30  Today's TOC FU Call Status: Today's TOC FU Call Status:: Unsuccessful Call (2nd Attempt) Unsuccessful Call (1st Attempt) Date: 04/02/23 Unsuccessful Call (2nd Attempt) Date: 04/03/23  Attempted to reach the patient regarding the most recent Inpatient/ED visit.  Follow Up Plan: Additional outreach attempts will be made to reach the patient to complete the Transitions of Care (Post Inpatient/ED visit) call.   Signature Arvil Persons, BSN, Charity fundraiser

## 2023-04-09 NOTE — Transitions of Care (Post Inpatient/ED Visit) (Signed)
 04/09/2023  Name: Jennifer [name] MRN: [id number] DOB: 1960/11/21  Today's TOC FU Call Status: Today's TOC FU Call Status:: Successful TOC FU Call Completed Unsuccessful Call (1st Attempt) Date: 04/02/23 Unsuccessful Call (2nd Attempt) Date: 04/03/23 [facility] FU Call Complete Date: 04/09/23 Patient's Name and Date of Birth confirmed.  Transition Care Management Follow-up Telephone Call Date of Discharge: 04/01/23 Discharge Facility: [facility]) Type of Discharge: Emergency Department How have you been since you were released from the hospital?: Better Any questions or concerns?: No  Items Reviewed: Did you receive and understand the discharge instructions provided?: Yes Medications obtained,verified, and reconciled?: Yes (Medications Reviewed) Any new allergies since your discharge?: No Dietary orders reviewed?: NA  Medications Reviewed Today: Medications Reviewed Today     Reviewed by Larey Dresser, RN (Registered Nurse) on 04/09/23 at [phone]  Med List Status: <None>   Medication Order Taking? Sig Documenting Provider Last Dose Status Informant  Albuterol Sulfate (PROAIR RESPICLICK) 108 (90 Base) MCG/ACT AEPB [id number] Yes Inhale 1 puff into the lungs every 6 (six) hours as needed. Anne Ng, NP Taking Active            Med Note Jenne Pane, TEQUILA   Mon Oct 25, 2019  3:53 PM) PRN  amLODipine (NORVASC) 10 MG tablet [id number] Yes Take 1 tablet (10 mg total) by mouth at bedtime. Nche, Bonna Gains, NP Taking Active   carvedilol (COREG) 25 MG tablet [id number] Yes Take 1 tablet (25 mg total) by mouth 2 (two) times daily with a meal. Nche, Bonna Gains, NP Taking Active   fluticasone (FLONASE) 50 MCG/ACT nasal spray [id number]  PLACE 2 SPRAYS INTO BOTH NOSTRILS DAILY. Anne Ng, NP  Expired 08/12/20 2359   hydrOXYzine (ATARAX) 25 MG tablet [id number]  Take by mouth. [provider]  Active   lidocaine (LIDODERM) 5 % [id number] No Place 1 patch onto  the skin daily. Remove & Discard patch within 12 hours or as directed by MD  Patient not taking: Reported on 04/09/2023   Glendora Score, MD Not Taking Active Self  MAGNESIUM PO [id number] Yes Take by mouth.  [provider] Taking Active   Multiple Vitamin (MULTIVITAMIN WITH MINERALS) TABS tablet [id number] Yes Take 1 tablet by mouth at bedtime. ALIVE multi-vitamin [provider] Taking Active Self  naproxen (NAPROSYN) 375 MG tablet [id number] Yes Take 1 tablet (375 mg total) by mouth 2 (two) times daily as needed for moderate pain. Kommor, [place], MD Taking Active   neomycin-polymyxin b-dexamethasone (MAXITROL) 3.5-10000-0.1 SUSP [id number] No Instill 1 drop into right eye four times a day  Patient not taking: Reported on 04/09/2023    Not Taking Active   ofloxacin (OCUFLOX) 0.3 % ophthalmic solution [id number] No Place 1 drop into the left eye 4 (four) times daily for 7 days.  Patient not taking: Reported on 04/09/2023    Not Taking Active   olmesartan-hydrochlorothiazide (BENICAR HCT) 40-12.5 MG tablet [id number] Yes Take 1 tablet by mouth daily. Anne Ng, NP Taking Active   OVER THE COUNTER MEDICATION [id number] Yes Herbal Tea daily  [provider] Taking Active   prednisoLONE acetate (PRED FORTE) 1 % ophthalmic suspension [id number] No Apply 1 drop into right eye 4 times a day for 1 week. Then 1 drop into right eye 3 times a day for 1 week. Then 1 drop into right eye 2 times a day for 1 week. Then 1 drop into right eye daily for a week.  Patient not taking: Reported  on 04/09/2023   Carmela Rima, MD Not Taking Active   prednisoLONE acetate (PRED FORTE) 1 % ophthalmic suspension [id number] No Place 1 drop into affected eye. Start four times daily and taper down as directed.  Patient not taking: Reported on 04/09/2023   Carmela Rima, MD Not Taking Active             Home Care and Equipment/Supplies: Were Home Health Services Ordered?: NA Any new  equipment or medical supplies ordered?: NA  Functional Questionnaire: Do you need assistance with bathing/showering or dressing?: No Do you need assistance with meal preparation?: No Do you need assistance with eating?: No Do you have difficulty maintaining continence: No Do you need assistance with getting out of bed/getting out of a chair/moving?: No Do you have difficulty managing or taking your medications?: No  Follow up appointments reviewed: PCP Follow-up appointment confirmed?: Yes Date of PCP follow-up appointment?: 04/15/23 Follow-up Provider: [facility] Follow-up appointment confirmed?: NA Do you need transportation to your follow-up appointment?: No Do you understand care options if your condition(s) worsen?: Yes-patient verbalized understanding    SIGNATURE Arvil Persons, BSN, RN

## 2023-04-15 ENCOUNTER — Ambulatory Visit: Payer: Managed Care, Other (non HMO) | Admitting: Nurse Practitioner

## 2023-04-15 ENCOUNTER — Encounter: Payer: Self-pay | Admitting: Nurse Practitioner

## 2023-04-15 ENCOUNTER — Other Ambulatory Visit (HOSPITAL_COMMUNITY): Payer: Self-pay

## 2023-04-15 VITALS — BP 134/76 | HR 66 | Temp 97.8°F | Ht 62.0 in | Wt 256.3 lb

## 2023-04-15 DIAGNOSIS — I10 Essential (primary) hypertension: Secondary | ICD-10-CM | POA: Diagnosis not present

## 2023-04-15 DIAGNOSIS — R739 Hyperglycemia, unspecified: Secondary | ICD-10-CM | POA: Diagnosis not present

## 2023-04-15 DIAGNOSIS — E66813 Obesity, class 3: Secondary | ICD-10-CM

## 2023-04-15 DIAGNOSIS — Z6841 Body Mass Index (BMI) 40.0 and over, adult: Secondary | ICD-10-CM | POA: Diagnosis not present

## 2023-04-15 LAB — RENAL FUNCTION PANEL
Albumin: 4.1 g/dL (ref 3.5–5.2)
BUN: 12 mg/dL (ref 6–23)
CO2: 28 meq/L (ref 19–32)
Calcium: 9.3 mg/dL (ref 8.4–10.5)
Chloride: 104 meq/L (ref 96–112)
Creatinine, Ser: 0.69 mg/dL (ref 0.40–1.20)
GFR: 91.77 mL/min (ref >60.00)
Glucose, Bld: 103 mg/dL — ABNORMAL HIGH (ref 70–99)
Phosphorus: 3.1 mg/dL (ref 2.3–4.6)
Potassium: 3.8 meq/L (ref 3.5–5.1)
Sodium: 139 meq/L (ref 135–145)

## 2023-04-15 LAB — HEMOGLOBIN A1C: Hgb A1c MFr Bld: 5.8 % (ref 4.6–6.5)

## 2023-04-15 MED ORDER — AMLODIPINE BESYLATE 10 MG PO TABS
10.0000 mg | ORAL_TABLET | Freq: Every day | ORAL | 3 refills | Status: DC
  Filled 2023-04-15: qty 30, 30d supply, fill #0

## 2023-04-15 MED ORDER — CARVEDILOL 25 MG PO TABS
25.0000 mg | ORAL_TABLET | Freq: Two times a day (BID) | ORAL | 3 refills | Status: DC
  Filled 2023-04-15: qty 60, 30d supply, fill #0
  Filled 2023-05-19: qty 60, 30d supply, fill #1
  Filled 2023-07-17: qty 60, 30d supply, fill #2
  Filled 2023-08-19: qty 60, 30d supply, fill #3
  Filled 2023-09-16: qty 60, 30d supply, fill #4
  Filled 2023-11-24: qty 60, 30d supply, fill #5
  Filled 2023-12-26: qty 60, 30d supply, fill #6
  Filled 2024-02-10: qty 60, 30d supply, fill #7

## 2023-04-15 MED ORDER — OLMESARTAN MEDOXOMIL-HCTZ 40-12.5 MG PO TABS
1.0000 | ORAL_TABLET | Freq: Every day | ORAL | 1 refills | Status: DC
  Filled 2023-04-15: qty 30, 30d supply, fill #0
  Filled 2023-05-19: qty 30, 30d supply, fill #1
  Filled 2023-06-19: qty 30, 30d supply, fill #2
  Filled 2023-07-17: qty 30, 30d supply, fill #3
  Filled 2023-08-19: qty 30, 30d supply, fill #4
  Filled 2023-09-16: qty 30, 30d supply, fill #5

## 2023-04-15 NOTE — Patient Instructions (Signed)
 Go to lab Maintain Heart healthy diet and daily exercise. Maintain current medications.

## 2023-04-15 NOTE — Assessment & Plan Note (Signed)
 BP at goal with benicar hct and amlodipine BP Readings from Last 3 Encounters:  04/15/23 134/76  04/01/23 132/88  01/16/23 136/79    Maintain med doses Repeat bmp F/up in 6months

## 2023-04-15 NOTE — Progress Notes (Signed)
 Established Patient Visit  Patient: Jennifer [name]   DOB: 1960/02/12   63 y.o. Female  MRN: [id number] Visit Date: 04/15/2023  Subjective:    Chief Complaint  Patient presents with   Follow-up    3 month f/u Refill BP medications requested- Amlodipine, Olm-hydrochlorothiazide, Coreg   HPI Essential hypertension BP at goal with benicar hct and amlodipine BP Readings from Last 3 Encounters:  04/15/23 134/76  04/01/23 132/88  01/16/23 136/79    Maintain med doses Repeat bmp F/up in 6months  Hyperglycemia Repeat hgbA1c Encouraged to maintain heart healthy diet and daily exercise  Class 3 severe obesity due to excess calories with serious comorbidity and body mass index (BMI) of 45.0 to 49.9 in adult [facility]) Does not cook, so she relies on restaurant meals. Reports decreased meal portions, but unable to determine portion size. No exercise regimen at this time. Wt Readings from Last 3 Encounters:  04/15/23 256 lb 4.8 oz (116.3 kg)  04/01/23 251 lb 8.7 oz (114.1 kg)  01/16/23 251 lb 9.6 oz (114.1 kg)    Encourage to use printed material to help with meal portions and start daily exercise-low impact.  Wt Readings from Last 3 Encounters:  04/15/23 256 lb 4.8 oz (116.3 kg)  04/01/23 251 lb 8.7 oz (114.1 kg)  01/16/23 251 lb 9.6 oz (114.1 kg)    BP Readings from Last 3 Encounters:  04/15/23 134/76  04/01/23 132/88  01/16/23 136/79    Reviewed medical, surgical, and social history today  Medications: Outpatient Medications Prior to Visit  Medication Sig   Albuterol Sulfate (PROAIR RESPICLICK) 108 (90 Base) MCG/ACT AEPB Inhale 1 puff into the lungs every 6 (six) hours as needed.   fluticasone (FLONASE) 50 MCG/ACT nasal spray PLACE 2 SPRAYS INTO BOTH NOSTRILS DAILY.   hydrOXYzine (ATARAX) 25 MG tablet Take by mouth.   MAGNESIUM PO Take by mouth.    Multiple Vitamin (MULTIVITAMIN WITH MINERALS) TABS tablet Take 1 tablet by mouth at bedtime. ALIVE  multi-vitamin   naproxen (NAPROSYN) 375 MG tablet Take 1 tablet (375 mg total) by mouth 2 (two) times daily as needed for moderate pain.   OVER THE COUNTER MEDICATION Herbal Tea daily    [DISCONTINUED] amLODipine (NORVASC) 10 MG tablet Take 1 tablet (10 mg total) by mouth at bedtime.   [DISCONTINUED] carvedilol (COREG) 25 MG tablet Take 1 tablet (25 mg total) by mouth 2 (two) times daily with a meal.   [DISCONTINUED] lidocaine (LIDODERM) 5 % Place 1 patch onto the skin daily. Remove & Discard patch within 12 hours or as directed by MD   [DISCONTINUED] neomycin-polymyxin b-dexamethasone (MAXITROL) 3.5-10000-0.1 SUSP Instill 1 drop into right eye four times a day   [DISCONTINUED] ofloxacin (OCUFLOX) 0.3 % ophthalmic solution Place 1 drop into the left eye 4 (four) times daily for 7 days.   [DISCONTINUED] olmesartan-hydrochlorothiazide (BENICAR HCT) 40-12.5 MG tablet Take 1 tablet by mouth daily.   [DISCONTINUED] prednisoLONE acetate (PRED FORTE) 1 % ophthalmic suspension Apply 1 drop into right eye 4 times a day for 1 week. Then 1 drop into right eye 3 times a day for 1 week. Then 1 drop into right eye 2 times a day for 1 week. Then 1 drop into right eye daily for a week.   [DISCONTINUED] prednisoLONE acetate (PRED FORTE) 1 % ophthalmic suspension Place 1 drop into affected eye. Start four times daily and taper down as directed.  No facility-administered medications prior to visit.   Reviewed past medical and social history.   ROS per HPI above      Objective:  BP 134/76 (BP Location: Right Arm, Patient Position: Sitting, Cuff Size: Normal)   Pulse 66   Temp 97.8 F (36.6 C) (Temporal)   Ht 5\' 2"  (1.575 m)   Wt 256 lb 4.8 oz (116.3 kg)   SpO2 99%   BMI 46.88 kg/m      Physical Exam Vitals and nursing note reviewed.  Cardiovascular:     Rate and Rhythm: Normal rate and regular rhythm.     Pulses: Normal pulses.     Heart sounds: Normal heart sounds.  Pulmonary:     Effort:  Pulmonary effort is normal.     Breath sounds: Normal breath sounds.  Neurological:     Mental Status: She is alert and oriented to person, place, and time.     No results found for any visits on 04/15/23.    Assessment & Plan:    Problem List Items Addressed This Visit     Class 3 severe obesity due to excess calories with serious comorbidity and body mass index (BMI) of 45.0 to 49.9 in adult [facility])   Does not cook, so she relies on restaurant meals. Reports decreased meal portions, but unable to determine portion size. No exercise regimen at this time. Wt Readings from Last 3 Encounters:  04/15/23 256 lb 4.8 oz (116.3 kg)  04/01/23 251 lb 8.7 oz (114.1 kg)  01/16/23 251 lb 9.6 oz (114.1 kg)    Encourage to use printed material to help with meal portions and start daily exercise-low impact.      Essential hypertension - Primary   BP at goal with benicar hct and amlodipine BP Readings from Last 3 Encounters:  04/15/23 134/76  04/01/23 132/88  01/16/23 136/79    Maintain med doses Repeat bmp F/up in 6months      Relevant Medications   amLODipine (NORVASC) 10 MG tablet   carvedilol (COREG) 25 MG tablet   olmesartan-hydrochlorothiazide (BENICAR HCT) 40-12.5 MG tablet   Other Relevant Orders   Renal Function Panel   Hyperglycemia   Repeat hgbA1c Encouraged to maintain heart healthy diet and daily exercise      Relevant Orders   Hemoglobin A1c   Return in about 6 months (around 10/16/2023) for HTN, hyperlipidemia (fasting).     Alysia Penna, NP

## 2023-04-15 NOTE — Assessment & Plan Note (Signed)
 Does not cook, so she relies on restaurant meals. Reports decreased meal portions, but unable to determine portion size. No exercise regimen at this time. Wt Readings from Last 3 Encounters:  04/15/23 256 lb 4.8 oz (116.3 kg)  04/01/23 251 lb 8.7 oz (114.1 kg)  01/16/23 251 lb 9.6 oz (114.1 kg)    Encourage to use printed material to help with meal portions and start daily exercise-low impact.

## 2023-04-15 NOTE — Assessment & Plan Note (Signed)
Repeat hgbA1c Encouraged to maintain heart healthy diet and daily exercise

## 2023-04-16 ENCOUNTER — Encounter: Payer: Self-pay | Admitting: Nurse Practitioner

## 2023-04-16 DIAGNOSIS — N6320 Unspecified lump in the left breast, unspecified quadrant: Secondary | ICD-10-CM

## 2023-05-01 ENCOUNTER — Ambulatory Visit
Admission: RE | Admit: 2023-05-01 | Discharge: 2023-05-01 | Disposition: A | Discharge status: Home / Self Care | Source: Ambulatory Visit | Attending: Nurse Practitioner

## 2023-05-01 ENCOUNTER — Ambulatory Visit
Admission: RE | Admit: 2023-05-01 | Discharge: 2023-05-01 | Disposition: A | Discharge status: Home / Self Care | Source: Ambulatory Visit | Attending: Nurse Practitioner | Admitting: Nurse Practitioner

## 2023-05-01 DIAGNOSIS — N6320 Unspecified lump in the left breast, unspecified quadrant: Secondary | ICD-10-CM

## 2023-08-05 ENCOUNTER — Ambulatory Visit: Payer: Self-pay

## 2023-08-05 ENCOUNTER — Ambulatory Visit
Admission: RE | Admit: 2023-08-05 | Discharge: 2023-08-05 | Disposition: A | Discharge status: Home / Self Care | Source: Ambulatory Visit | Attending: Family Medicine

## 2023-08-05 ENCOUNTER — Encounter: Payer: Self-pay | Admitting: Family Medicine

## 2023-08-05 ENCOUNTER — Ambulatory Visit (INDEPENDENT_AMBULATORY_CARE_PROVIDER_SITE_OTHER): Admitting: Family Medicine

## 2023-08-05 VITALS — BP 159/88 | Ht 62.0 in | Wt 240.0 lb

## 2023-08-05 DIAGNOSIS — M25512 Pain in left shoulder: Secondary | ICD-10-CM | POA: Diagnosis not present

## 2023-08-05 DIAGNOSIS — G8929 Other chronic pain: Secondary | ICD-10-CM | POA: Diagnosis not present

## 2023-08-05 DIAGNOSIS — M25572 Pain in left ankle and joints of left foot: Secondary | ICD-10-CM

## 2023-08-05 MED ORDER — METHYLPREDNISOLONE ACETATE 40 MG/ML IJ SUSP
40.0000 mg | Freq: Once | INTRAMUSCULAR | Status: AC
  Administered 2023-08-05: 40 mg via INTRA_ARTICULAR

## 2023-08-05 NOTE — Progress Notes (Signed)
 PCP: Katheen Roselie Rockford, NP  Chief Complaint: Left ankle, left shoulder pain Subjective:   HPI: Patient is a 63 y.o. female here for left shoulder pain.  Patient notes that she felt good after previous steroid injection.  Patient has some decreased range of motion with abduction as well as external rotation.  Patient like to go ahead and get a left shoulder injection again today.  Patient also notes some left ankle pain that has improved.  Patient notes that she was moving on Saturday and she was moving and sandals and her ankle started to hurt her on Sunday.  Patient notes no specific injury but does note that she felt really unstable in her left ankle.  Patient states that the pain on Sunday was significant to where she felt like she had to go to the ER but that her pain now is currently a 1 out of 10.  Patient notes improvement and still has swelling.  Past Medical History:  Diagnosis Date   Allergy    allergic rhinitis   Anemia 06/2007   C-scope 06/2007: tics, neg hemocults 07/2007   Anxiety    Blood transfusion without reported diagnosis    at birth   Chest pain 12/2007   negative ECHO, neg stress test 06/2008   Condyloma acuminatum    history of   Hyperglycemia    borderline. A1c 5.7  12/2007   Hypertension    Personal history of atrial fibrillation    on carvedilol  only    Sleep apnea    wears cpap at night    Current Outpatient Medications on File Prior to Visit  Medication Sig Dispense Refill   Albuterol  Sulfate (PROAIR  RESPICLICK) 108 (90 Base) MCG/ACT AEPB Inhale 1 puff into the lungs every 6 (six) hours as needed. 1 each 0   amLODipine  (NORVASC ) 10 MG tablet Take 1 tablet (10 mg total) by mouth at bedtime. 90 tablet 3   carvedilol  (COREG ) 25 MG tablet Take 1 tablet (25 mg total) by mouth 2 (two) times daily with a meal. 180 tablet 3   fluticasone  (FLONASE ) 50 MCG/ACT nasal spray PLACE 2 SPRAYS INTO BOTH NOSTRILS DAILY. 16 g 5   hydrOXYzine (ATARAX) 25 MG tablet Take by  mouth.     MAGNESIUM PO Take by mouth.      Multiple Vitamin (MULTIVITAMIN WITH MINERALS) TABS tablet Take 1 tablet by mouth at bedtime. ALIVE multi-vitamin     naproxen  (NAPROSYN ) 375 MG tablet Take 1 tablet (375 mg total) by mouth 2 (two) times daily as needed for moderate pain. 20 tablet 0   olmesartan -hydrochlorothiazide  (BENICAR  HCT) 40-12.5 MG tablet Take 1 tablet by mouth daily. 90 tablet 1   OVER THE COUNTER MEDICATION Herbal Tea daily      No current facility-administered medications on file prior to visit.    Past Surgical History:  Procedure Laterality Date   ABDOMINAL HYSTERECTOMY     no oophorectomy   BREAST EXCISIONAL BIOPSY     BREAST SURGERY  age 67   Fibroid tumor removed right breast   CARPAL TUNNEL RELEASE     CESAREAN SECTION     CHOLECYSTECTOMY     COLONOSCOPY      Allergies  Allergen Reactions   Ace Inhibitors Cough   Codeine Nausea And Vomiting    BP (!) 152/80   Ht 5' 2 (1.575 m)   Wt 240 lb (108.9 kg)   BMI 43.90 kg/m      11/02/2019    1:31  PM 12/21/2019    3:55 PM  [facility]  Frequency of aerobic exercise (# of days/week) 0 0  Average time in minutes 0 0  Frequency of strengthening activities (# of days/week) 0 0        No data to display              Objective:  Physical Exam:  Gen: NAD, comfortable in exam room  Inspection of the left ankle shows some mild swelling on the medial aspect of the joint line to the right.  There is some mild tenderness to palpation over the anterior portion of the medial malleolus.  No posterior tenderness.  No tenderness over the ATFL or the AITFL.  Range of motion is full with plantar and dorsiflexion.  No pain with inversion or eversion.  Ankle drawer testing is normal.  Inspection of the left humeral head appears appropriate.  No tenderness palpation over the bicipital groove.  Range of motion is noted to be decreased in 2 planes, abduction as well as external rotation.   Weakness noted against abduction with resistance as well as external rotation against resistance.  Internal rotation appears to be appropriate.   Assessment & Plan:  1. 1. Chronic left shoulder pain (Primary) - I suspect patient's left shoulder pain is likely related to adhesive capsulitis versus some arthritic change, we will go ahead and do injection of the left shoulder.  Patient tolerated procedure well and noted significant improvement after injection. - US  LIMITED JOINT SPACE STRUCTURES UP LEFT; Future  2. Acute left ankle pain -Patient's left ankle pain likely related to some instability issues.  Will go ahead and do an ASO brace for the next 2 weeks.  Patient also given exercises to do over the next 3 to 4 weeks.  Patient to follow-up in 4 weeks and we will reevaluate at that time.   US -guided glenohumeral joint injection, left shoulder After discussion on risks/benefits/indications, informed verbal consent was obtained. A timeout was then performed. The patient was positioned lying lateral recumbent on examination table. The patient's shoulder was prepped with betadine and multiple alcohol swabs and utilizing ultrasound guidance, the patient's glenohumeral joint was identified on ultrasound. Using ultrasound guidance a 22-gauge, 3.5 inch needle with a mixture of 6:2 cc's lidocaine :depomedrol was directed from a lateral to medial direction via in-plane technique into the glenohumeral joint with visualization of appropriate spread of injectate into the joint. Patient tolerated the procedure well without immediate complications.       Nely Dedmon MD, PGY-4  Sports Medicine Fellow [facility]

## 2023-08-05 NOTE — Addendum Note (Signed)
 Addended by: MARTHA BOUCHARD E on: 08/05/2023 04:06 PM   Modules accepted: Orders

## 2023-08-26 ENCOUNTER — Encounter: Payer: Self-pay | Admitting: Internal Medicine

## 2023-08-26 ENCOUNTER — Ambulatory Visit: Admitting: Internal Medicine

## 2023-08-26 ENCOUNTER — Telehealth: Payer: Self-pay

## 2023-08-26 ENCOUNTER — Encounter

## 2023-08-26 ENCOUNTER — Other Ambulatory Visit (HOSPITAL_COMMUNITY): Payer: Self-pay

## 2023-08-26 VITALS — BP 132/84 | HR 73 | Temp 98.1°F | Ht 62.0 in | Wt 242.0 lb

## 2023-08-26 DIAGNOSIS — S39012A Strain of muscle, fascia and tendon of lower back, initial encounter: Secondary | ICD-10-CM

## 2023-08-26 MED ORDER — KETOROLAC TROMETHAMINE 60 MG/2ML IM SOLN
60.0000 mg | Freq: Once | INTRAMUSCULAR | Status: AC
Start: 2023-08-26 — End: 2023-08-26
  Administered 2023-08-26: 60 mg via INTRAMUSCULAR

## 2023-08-26 MED ORDER — LIDOCAINE 5 % EX PTCH
1.0000 | MEDICATED_PATCH | CUTANEOUS | 0 refills | Status: DC
  Filled 2023-08-26: qty 30, 30d supply, fill #0

## 2023-08-26 MED ORDER — NAPROXEN 500 MG PO TABS
500.0000 mg | ORAL_TABLET | Freq: Two times a day (BID) | ORAL | 0 refills | Status: AC
  Filled 2023-08-26: qty 10, 5d supply, fill #0

## 2023-08-26 MED ORDER — METHOCARBAMOL 500 MG PO TABS
500.0000 mg | ORAL_TABLET | Freq: Three times a day (TID) | ORAL | 0 refills | Status: DC | PRN
  Filled 2023-08-26: qty 30, 10d supply, fill #0

## 2023-08-26 NOTE — Telephone Encounter (Signed)
 Pharmacy Patient Advocate Encounter   Received notification from CoverMyMeds that prior authorization for  Lidocaine  5% patches is required/requested.   Insurance verification completed.   The patient is insured through [employer] .   Per test claim: PA required; PA submitted to above mentioned insurance via CoverMyMeds Key/confirmation #/EOC [id number] Status is pending

## 2023-08-26 NOTE — Patient Instructions (Signed)
 Heat pad  Rest  Take Naproxen  500mg  two times a day for 5 days. Take with food.  Take muscle relaxant as needed (Methocarbamol ) . Be aware may cause drowsiness.  Can use lidocaine  patches as needed . If insurance will not cover, then can get over the counter patches.

## 2023-08-26 NOTE — Progress Notes (Signed)
 [name] [address] [place] [address] Dept: [phone] Dept Fax: [phone]  Acute Care Office Visit  Subjective:   Jennifer [name] Jul 24, 1960 08/26/2023  Chief Complaint  Patient presents with   Back Pain    Diarrhea last week took peto Started yesterday     HPI:  Discussed the use of AI scribe software for clinical note transcription with the patient, who gave verbal consent to proceed.  History of Present Illness   Jennifer MOLZAHN is a 63 year old female who presents with acute right-sided back pain.  She has been experiencing acute pain on the right side of her back, which began as mild discomfort yesterday and has significantly worsened today. The pain is localized to the right side, extending from the back, and is particularly painful when sitting down, getting up, or bending over. She states, 'It hurts for me to sit down. It hurts for me to get up.'  She denies any recent activities that could have triggered the pain, such as lifting, pulling, or pushing heavy objects. No fever, rash, urinary symptoms, or pain radiating down her leg. No bowel/bladder incontinence.  For pain management, she has been taking Aleve , but it has not provided relief for this specific pain, although it helps with other discomforts. She confirms taking one tablet of Aleve  PRN.       The following portions of the patient's history were reviewed and updated as appropriate: past medical history, past surgical history, family history, social history, allergies, medications, and problem list.   Patient Active Problem List   Diagnosis Date Noted   Macular puckering, bilateral 01/16/2023   Left knee pain 01/18/2019   Elevated LDL cholesterol level 04/21/2018   OSA (obstructive sleep apnea) 03/13/2016   Hyperglycemia 01/12/2016   PAF (paroxysmal atrial fibrillation) (HCC) 11/16/2015   Esophageal reflux 10/16/2012   Periodic  headache syndrome 08/21/2012   Class 3 severe obesity due to excess calories with serious comorbidity and body mass index (BMI) of 45.0 to 49.9 in adult 12/20/2011   HOT FLASHES 04/27/2010   CHOLECYSTECTOMY, LAPAROSCOPIC, HX OF 08/09/2009   Allergic rhinitis 04/26/2008   Anemia 05/05/2007   CONDYLOMA ACUMINATUM 04/15/2007   Essential hypertension 07/03/2006   Past Medical History:  Diagnosis Date   Allergy    allergic rhinitis   Anemia 06/2007   C-scope 06/2007: tics, neg hemocults 07/2007   Anxiety    Blood transfusion without reported diagnosis    at birth   Chest pain 12/2007   negative ECHO, neg stress test 06/2008   Condyloma acuminatum    history of   Hyperglycemia    borderline. A1c 5.7  12/2007   Hypertension    Personal history of atrial fibrillation    on carvedilol  only    Sleep apnea    wears cpap at night   Past Surgical History:  Procedure Laterality Date   ABDOMINAL HYSTERECTOMY     no oophorectomy   BREAST EXCISIONAL BIOPSY     BREAST SURGERY  age 49   Fibroid tumor removed right breast   CARPAL TUNNEL RELEASE     CESAREAN SECTION     CHOLECYSTECTOMY     COLONOSCOPY     Family History  Problem Relation Age of Onset   Breast cancer Paternal Aunt    Cervical cancer Cousin    Diabetes Mother    Coronary artery disease Father    Kidney disease Father    Diabetes Father  Hypertension Father    Prostate cancer Father    Coronary artery disease Sister    Kidney disease Sister    Diabetes Sister    Hypertension Sister    Kidney disease Brother    Stroke Brother    Hypertension Brother        x3   Diabetes Maternal Grandmother    Stroke Maternal Grandmother    Diabetes Paternal Grandmother    Stroke Paternal Grandmother    Coronary artery disease Other        grandmother, uncle   Kidney disease Other        grandmother   Stroke Other        uncle   Thyroid  disease Other        aunts   Glaucoma Other        grandmother   Hypertension Other         grandmother   Diabetes Other        aunts   Hypothyroidism Other        aunt   Hyperthyroidism Other        aunt   Healthy Child    Healthy Child    Healthy Child    Colon cancer Neg Hx    Colon polyps Neg Hx    Esophageal cancer Neg Hx    Rectal cancer Neg Hx    Stomach cancer Neg Hx     Current Outpatient Medications:    amLODipine  (NORVASC ) 10 MG tablet, Take 1 tablet (10 mg total) by mouth at bedtime., Disp: 90 tablet, Rfl: 3   lidocaine  (LIDODERM ) 5 %, Place 1 patch onto the skin daily. Remove & Discard patch within 12 hours or as directed by MD, Disp: 30 patch, Rfl: 0   MAGNESIUM PO, Take by mouth. , Disp: , Rfl:    methocarbamol  (ROBAXIN ) 500 MG tablet, Take 1 tablet (500 mg total) by mouth every 8 (eight) hours as needed for muscle spasms., Disp: 30 tablet, Rfl: 0   Multiple Vitamin (MULTIVITAMIN WITH MINERALS) TABS tablet, Take 1 tablet by mouth at bedtime. ALIVE multi-vitamin, Disp: , Rfl:    naproxen  (NAPROSYN ) 500 MG tablet, Take 1 tablet (500 mg total) by mouth 2 (two) times daily with a meal for 5 days., Disp: 10 tablet, Rfl: 0   olmesartan -hydrochlorothiazide  (BENICAR  HCT) 40-12.5 MG tablet, Take 1 tablet by mouth daily., Disp: 90 tablet, Rfl: 1   OVER THE COUNTER MEDICATION, Herbal Tea daily , Disp: , Rfl:    Albuterol  Sulfate (PROAIR  RESPICLICK) 108 (90 Base) MCG/ACT AEPB, Inhale 1 puff into the lungs every 6 (six) hours as needed. (Patient not taking: Reported on 08/26/2023), Disp: 1 each, Rfl: 0   carvedilol  (COREG ) 25 MG tablet, Take 1 tablet (25 mg total) by mouth 2 (two) times daily with a meal., Disp: 180 tablet, Rfl: 3   fluticasone  (FLONASE ) 50 MCG/ACT nasal spray, PLACE 2 SPRAYS INTO BOTH NOSTRILS DAILY. (Patient not taking: Reported on 08/26/2023), Disp: 16 g, Rfl: 5   hydrOXYzine (ATARAX) 25 MG tablet, Take by mouth. (Patient not taking: Reported on 08/26/2023), Disp: , Rfl:  Allergies  Allergen Reactions   Ace Inhibitors Cough   Codeine Nausea And  Vomiting     ROS: A complete ROS was performed with pertinent positives/negatives noted in the HPI. The remainder of the ROS are negative.    Objective:   Today's Vitals   08/26/23 1338  BP: 132/84  Pulse: 73  Temp: 98.1 F (36.7 C)  TempSrc: Temporal  SpO2: 96%  Weight: 242 lb (109.8 kg)  Height: 5' 2 (1.575 m)    GENERAL: Well-appearing, in NAD. Well nourished.  SKIN: Pink, warm and dry. No rash, lesion, ulceration, or ecchymoses.  NECK: Trachea midline. Full ROM w/o pain or tenderness.  RESPIRATORY: Chest wall symmetrical. Respirations even and non-labored.  CARDIAC: Peripheral pulses 2+ bilaterally.  MSK: Muscle tone and strength appropriate for age. Pain reproduced with back flexion, extension, rotation, and lateral bending. No spinal tenderness.  GI: no CVA tenderness.  EXTREMITIES: Without clubbing, cyanosis, or edema.  NEUROLOGIC:  Steady, even gait.  PSYCH/MENTAL STATUS: Alert, oriented x 3. Cooperative, appropriate mood and affect.    No results found for any visits on 08/26/23.    Assessment & Plan:  Assessment and Plan    Musculoskeletal back pain Acute right-sided musculoskeletal back pain likely due to strain. Pain exacerbated by movement, non-radiating. - Prescribed lidocaine  patches. If not covered, recommend OTC options like Salonpas. - Instructed to take two naproxen  tablets twice daily for five days post-meals. - Provided Salonpas sample - Recommended heating pad for analgesia. - Prescribed Robaxin  500mg  TID PRN  - Administered ketorolac  i60mg  IM once in office      Meds ordered this encounter  Medications   ketorolac  (TORADOL ) injection 60 mg   naproxen  (NAPROSYN ) 500 MG tablet    Sig: Take 1 tablet (500 mg total) by mouth 2 (two) times daily with a meal for 5 days.    Dispense:  10 tablet    Refill:  0    Supervising Provider:   THOMPSON, AARON B [[id number]]   lidocaine  (LIDODERM ) 5 %    Sig: Place 1 patch onto the skin daily. Remove &  Discard patch within 12 hours or as directed by MD    Dispense:  30 patch    Refill:  0    Supervising Provider:   THOMPSON, AARON B [[id number]]   methocarbamol  (ROBAXIN ) 500 MG tablet    Sig: Take 1 tablet (500 mg total) by mouth every 8 (eight) hours as needed for muscle spasms.    Dispense:  30 tablet    Refill:  0    Supervising Provider:   THOMPSON, AARON B [[id number]]   No orders of the defined types were placed in this encounter.  Lab Orders  No laboratory test(s) ordered today   No images are attached to the encounter or orders placed in the encounter.  Return if symptoms worsen or fail to improve.   Rosina Senters, FNP

## 2023-08-27 ENCOUNTER — Other Ambulatory Visit (HOSPITAL_COMMUNITY): Payer: Self-pay

## 2023-08-28 ENCOUNTER — Other Ambulatory Visit (HOSPITAL_COMMUNITY): Payer: Self-pay

## 2023-08-28 NOTE — Telephone Encounter (Signed)
 Pharmacy Patient Advocate Encounter  Received notification from [facility] that Prior Authorization for Lidocaine  5% patches has been APPROVED from 07/27/2023 to 08/25/2024. Ran test claim, Copay is $14.78. This test claim was processed through [facility]- copay amounts may vary at other pharmacies due to pharmacy/plan contracts, or as the patient moves through the different stages of their insurance plan.   PA #/Case ID/Reference #: [id number]

## 2023-08-29 ENCOUNTER — Other Ambulatory Visit (HOSPITAL_COMMUNITY): Payer: Self-pay

## 2023-08-30 ENCOUNTER — Other Ambulatory Visit (HOSPITAL_COMMUNITY): Payer: Self-pay

## 2023-09-09 ENCOUNTER — Other Ambulatory Visit (HOSPITAL_COMMUNITY): Payer: Self-pay

## 2023-09-09 ENCOUNTER — Ambulatory Visit (INDEPENDENT_AMBULATORY_CARE_PROVIDER_SITE_OTHER): Admitting: Family Medicine

## 2023-09-09 ENCOUNTER — Ambulatory Visit
Admission: RE | Admit: 2023-09-09 | Discharge: 2023-09-09 | Disposition: A | Discharge status: Home / Self Care | Source: Ambulatory Visit | Attending: Family Medicine | Admitting: Family Medicine

## 2023-09-09 ENCOUNTER — Encounter: Payer: Self-pay | Admitting: Family Medicine

## 2023-09-09 VITALS — BP 166/90 | Ht 62.0 in | Wt 240.0 lb

## 2023-09-09 DIAGNOSIS — M25572 Pain in left ankle and joints of left foot: Secondary | ICD-10-CM | POA: Diagnosis not present

## 2023-09-09 DIAGNOSIS — M19011 Primary osteoarthritis, right shoulder: Secondary | ICD-10-CM

## 2023-09-09 DIAGNOSIS — I10 Essential (primary) hypertension: Secondary | ICD-10-CM | POA: Diagnosis not present

## 2023-09-09 DIAGNOSIS — M25511 Pain in right shoulder: Secondary | ICD-10-CM | POA: Diagnosis not present

## 2023-09-09 DIAGNOSIS — M19012 Primary osteoarthritis, left shoulder: Secondary | ICD-10-CM

## 2023-09-09 DIAGNOSIS — G8929 Other chronic pain: Secondary | ICD-10-CM

## 2023-09-09 DIAGNOSIS — M25512 Pain in left shoulder: Secondary | ICD-10-CM

## 2023-09-09 NOTE — Progress Notes (Signed)
 DATE OF VISIT: 09/09/2023        Jennifer [name] DOB: 1960/10/24 MRN: [id number]  CC:  f/u Lt shoulder and Lt ankle  History of present Illness: Jennifer [name] is a 63 y.o. female who presents for a follow-up visit  Last seen 08/05/23 by Dr Vita - was given [facility] at that time  Today she reports ongoing pain in the left shoulder Feels only approximately 25% improved Has not done any physical therapy since December Has been doing home exercises only intermittently Having decreased range of motion Pain with trying to lift items or with overhead activity  No longer having pain in the left ankle  Medications:  Outpatient Encounter Medications as of 09/09/2023  Medication Sig   Albuterol  Sulfate (PROAIR  RESPICLICK) 108 (90 Base) MCG/ACT AEPB Inhale 1 puff into the lungs every 6 (six) hours as needed. (Patient not taking: Reported on 08/26/2023)   amLODipine  (NORVASC ) 10 MG tablet Take 1 tablet (10 mg total) by mouth at bedtime.   carvedilol  (COREG ) 25 MG tablet Take 1 tablet (25 mg total) by mouth 2 (two) times daily with a meal.   fluticasone  (FLONASE ) 50 MCG/ACT nasal spray PLACE 2 SPRAYS INTO BOTH NOSTRILS DAILY. (Patient not taking: Reported on 08/26/2023)   hydrOXYzine (ATARAX) 25 MG tablet Take by mouth. (Patient not taking: Reported on 08/26/2023)   lidocaine  (LIDODERM ) 5 % Place 1 patch onto the skin daily. Remove & Discard patch within 12 hours or as directed by MD   MAGNESIUM PO Take by mouth.    methocarbamol  (ROBAXIN ) 500 MG tablet Take 1 tablet (500 mg total) by mouth every 8 (eight) hours as needed for muscle spasms.   Multiple Vitamin (MULTIVITAMIN WITH MINERALS) TABS tablet Take 1 tablet by mouth at bedtime. ALIVE multi-vitamin   olmesartan -hydrochlorothiazide  (BENICAR  HCT) 40-12.5 MG tablet Take 1 tablet by mouth daily.   OVER THE COUNTER MEDICATION Herbal Tea daily    No facility-administered encounter medications on file as of 09/09/2023.    Allergies: is allergic to  ace inhibitors and codeine.  Physical Examination: Vitals: BP (!) 166/90   Ht 5' 2 (1.575 m)   Wt 240 lb (108.9 kg)   BMI 43.90 kg/m  GENERAL:  Jennifer [name] is a 63 y.o. female appearing their stated age, alert and oriented x 3, in no apparent distress.  SKIN: no rashes or lesions, skin clean, dry, intact MSK: Shoulder: Left shoulder without gross deformity.  Tender palpation along the bicipital groove and greater tuberosity.  Significantly decreased active and passive range of motion in all planes limited by approximately 50%.  Rotator cuff strength 4 -/5 throughout.  Positive Hawkins, positive Neer, positive empty can, negative drop arm Right shoulder without gross deformity.  No tenderness to palpation.  Significantly decreased active and passive range of motion in all planes limited by approximately 25 to 30%.  Negative Hawkins, negative Neer, negative empty can, rotator cuff strength 4 -/5 throughout. Normal grip strength Neurovascular intact distally  Radiology: Left shoulder x-ray 01/11/2022 personally reviewed and interpreted today by me showing: - Moderate degenerative changes with osteophyte formation along the glenoid and the humeral head.  Associated subchondral cyst also noted along the glenohumeral joint - No acute bony abnormality  Assessment & Plan Primary osteoarthritis of shoulders, bilateral Chronic pain of bilateral shoulders, left greater than right limited improvement with prior cortisone injections.  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  No recent imaging.  Having increasing pain affecting activities  of daily living.  Plan: - Imaging: Reviewed prior x-rays from 2023.  Will obtain updated shoulder x-rays to assess for progression of osteoarthritis - She would benefit from return to physical therapy.  Referral to [facility] in [place] was placed - Continue OTC NSAIDs or Tylenol  as needed - Will reach out with x-ray  results and discuss further treatment Chronic pain of both shoulders Chronic bilateral shoulder pain, left greater than right with limited improvement with prior cortisone injections.  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  Plan: - Imaging: Reviewed prior x-rays from 2023.  Will obtain updated shoulder x-rays to assess for progression of osteoarthritis - She would benefit from return to physical therapy.  Referral to [facility] in [place] was placed - Continue OTC NSAIDs or Tylenol  as needed - Will reach out with x-ray results and discuss further treatment Acute left ankle pain Has resolved, follow-up as needed Essential hypertension Elevated blood pressure today.    PLAN: - should continue blood pressure medications as prescribed.   - She will follow-up with PCP as scheduled   Patient expressed understanding & agreement with above.  Encounter Diagnoses  Name Primary?   Primary osteoarthritis of shoulders, bilateral Yes   Chronic pain of both shoulders    Acute left ankle pain    Essential hypertension     Orders Placed This Encounter  Procedures   DG Shoulder Right   DG Shoulder Left   Ambulatory referral to Physical Therapy

## 2023-09-09 NOTE — Assessment & Plan Note (Signed)
 Elevated blood pressure today.    PLAN: - should continue blood pressure medications as prescribed.   - She will follow-up with PCP as scheduled

## 2023-09-09 NOTE — Patient Instructions (Signed)
 [facility] - inside [facility] [employer] [address] [place], [place] [address] [phone]

## 2023-09-15 ENCOUNTER — Ambulatory Visit: Payer: Self-pay | Admitting: Family Medicine

## 2023-09-15 NOTE — Progress Notes (Signed)
 Xrays reviewed, MyChart message sent.

## 2023-09-19 ENCOUNTER — Other Ambulatory Visit (HOSPITAL_COMMUNITY): Payer: Self-pay

## 2023-10-17 ENCOUNTER — Ambulatory Visit: Admitting: Nurse Practitioner

## 2023-11-07 ENCOUNTER — Other Ambulatory Visit (HOSPITAL_COMMUNITY): Payer: Self-pay

## 2023-11-07 ENCOUNTER — Encounter: Payer: Self-pay | Admitting: Nurse Practitioner

## 2023-11-07 ENCOUNTER — Other Ambulatory Visit: Payer: Self-pay

## 2023-11-07 ENCOUNTER — Ambulatory Visit (INDEPENDENT_AMBULATORY_CARE_PROVIDER_SITE_OTHER): Admitting: Nurse Practitioner

## 2023-11-07 VITALS — BP 150/98 | HR 64 | Temp 98.0°F | Ht 62.0 in | Wt 246.6 lb

## 2023-11-07 DIAGNOSIS — I10 Essential (primary) hypertension: Secondary | ICD-10-CM | POA: Diagnosis not present

## 2023-11-07 DIAGNOSIS — Z23 Encounter for immunization: Secondary | ICD-10-CM | POA: Diagnosis not present

## 2023-11-07 DIAGNOSIS — G8929 Other chronic pain: Secondary | ICD-10-CM

## 2023-11-07 DIAGNOSIS — R739 Hyperglycemia, unspecified: Secondary | ICD-10-CM | POA: Diagnosis not present

## 2023-11-07 DIAGNOSIS — M25512 Pain in left shoulder: Secondary | ICD-10-CM

## 2023-11-07 DIAGNOSIS — M25511 Pain in right shoulder: Secondary | ICD-10-CM

## 2023-11-07 DIAGNOSIS — E7800 Pure hypercholesterolemia, unspecified: Secondary | ICD-10-CM | POA: Diagnosis not present

## 2023-11-07 MED ORDER — OLMESARTAN MEDOXOMIL 40 MG PO TABS
40.0000 mg | ORAL_TABLET | Freq: Every day | ORAL | 3 refills | Status: AC
  Filled 2023-11-07 (×2): qty 30, 30d supply, fill #0
  Filled 2023-12-09: qty 30, 30d supply, fill #1
  Filled 2024-01-12: qty 30, 30d supply, fill #2
  Filled 2024-02-10: qty 30, 30d supply, fill #3
  Filled 2024-03-10: qty 30, 30d supply, fill #4

## 2023-11-07 MED ORDER — SPIRONOLACTONE 25 MG PO TABS
25.0000 mg | ORAL_TABLET | Freq: Every day | ORAL | 5 refills | Status: DC
  Filled 2023-11-07 (×2): qty 30, 30d supply, fill #0

## 2023-11-07 NOTE — Assessment & Plan Note (Signed)
 Secondary to OA, L>R Under the care of sports medicine, completed PT yesterday with no improvement  Advised to schedule f/up with sports medicine

## 2023-11-07 NOTE — Assessment & Plan Note (Addendum)
 BP not at goal with med compliance (coreg , amlodipine , and olmesartan /hydrochlorothiazide ) Home BP: 140s/80-100 Also compliant with CPAP machine, DASH diet, and walking EOD BP Readings from Last 3 Encounters:  11/07/23 (!) 142/80  09/09/23 (!) 166/90  08/26/23 132/84    Change olmesartan /hydrochlorothiazide  to olmesartan  Add spironolactone  Maintain other med doses Repeat CMP in 1week F/up in 40month Consider referral to advanced HYPERTENSION clinic

## 2023-11-07 NOTE — Progress Notes (Signed)
 Established Patient Visit  Patient: Jennifer [name]   DOB: 12-Mar-1960   63 y.o. Female  MRN: [id number] Visit Date: 11/07/2023  Subjective:    Chief Complaint  Patient presents with   Follow-up    Pt is fasting, Hyperlipidemia things are about the same, pain in both shoulders physical  therapy is over   HPI Essential hypertension BP not at goal with med compliance (coreg , amlodipine , and olmesartan /hydrochlorothiazide ) Home BP: 140s/80-100 Also compliant with CPAP machine, DASH diet, and walking EOD BP Readings from Last 3 Encounters:  11/07/23 (!) 142/80  09/09/23 (!) 166/90  08/26/23 132/84    Change olmesartan /hydrochlorothiazide  to olmesartan  Add spironolactone  Maintain other med doses Repeat CMP in 1week F/up in 49month Consider referral to advanced HYPERTENSION clinic  Chronic pain of both shoulders Secondary to OA, L>R Under the care of sports medicine, completed PT yesterday with no improvement  Advised to schedule f/up with sports medicine  Wt Readings from Last 3 Encounters:  11/07/23 246 lb 9.6 oz (111.9 kg)  09/09/23 240 lb (108.9 kg)  08/26/23 242 lb (109.8 kg)    Reviewed medical, surgical, and social history today  Medications: Outpatient Medications Prior to Visit  Medication Sig   amLODipine  (NORVASC ) 10 MG tablet Take 1 tablet (10 mg total) by mouth at bedtime.   carvedilol  (COREG ) 25 MG tablet Take 1 tablet (25 mg total) by mouth 2 (two) times daily with a meal.   lidocaine  (LIDODERM ) 5 % Place 1 patch onto the skin daily. Remove & Discard patch within 12 hours or as directed by MD   MAGNESIUM PO Take by mouth.    Multiple Vitamin (MULTIVITAMIN WITH MINERALS) TABS tablet Take 1 tablet by mouth at bedtime. ALIVE multi-vitamin   OVER THE COUNTER MEDICATION Herbal Tea daily    [DISCONTINUED] olmesartan -hydrochlorothiazide  (BENICAR  HCT) 40-12.5 MG tablet Take 1 tablet by mouth daily.   [DISCONTINUED] Albuterol  Sulfate (PROAIR   RESPICLICK) 108 (90 Base) MCG/ACT AEPB Inhale 1 puff into the lungs every 6 (six) hours as needed. (Patient not taking: Reported on 08/26/2023)   [DISCONTINUED] fluticasone  (FLONASE ) 50 MCG/ACT nasal spray PLACE 2 SPRAYS INTO BOTH NOSTRILS DAILY. (Patient not taking: Reported on 08/26/2023)   [DISCONTINUED] hydrOXYzine (ATARAX) 25 MG tablet Take by mouth. (Patient not taking: Reported on 08/26/2023)   [DISCONTINUED] methocarbamol  (ROBAXIN ) 500 MG tablet Take 1 tablet (500 mg total) by mouth every 8 (eight) hours as needed for muscle spasms. (Patient not taking: Reported on 11/07/2023)   No facility-administered medications prior to visit.   Reviewed past medical and social history.   ROS per HPI above      Objective:  BP (!) 142/80   Pulse 64   Temp 98 F (36.7 C) (Temporal)   Ht 5' 2 (1.575 m)   Wt 246 lb 9.6 oz (111.9 kg)   SpO2 97%   BMI 45.10 kg/m      Physical Exam Vitals and nursing note reviewed.  Constitutional:      Appearance: She is obese.  Cardiovascular:     Rate and Rhythm: Normal rate.     Pulses: Normal pulses.  Pulmonary:     Effort: Pulmonary effort is normal.  Musculoskeletal:     Right lower leg: Edema present.     Left lower leg: Edema present.  Neurological:     Mental Status: She is alert and oriented to person, place, and time.  No results found for any visits on 11/07/23.    Assessment & Plan:    Problem List Items Addressed This Visit     Chronic pain of both shoulders   Secondary to OA, L>R Under the care of sports medicine, completed PT yesterday with no improvement  Advised to schedule f/up with sports medicine      Elevated LDL cholesterol level   Relevant Orders   Comprehensive metabolic panel with GFR   Lipid panel   Essential hypertension - Primary   BP not at goal with med compliance (coreg , amlodipine , and olmesartan /hydrochlorothiazide ) Home BP: 140s/80-100 Also compliant with CPAP machine, DASH diet, and walking  EOD BP Readings from Last 3 Encounters:  11/07/23 (!) 142/80  09/09/23 (!) 166/90  08/26/23 132/84    Change olmesartan /hydrochlorothiazide  to olmesartan  Add spironolactone  Maintain other med doses Repeat CMP in 1week F/up in 54month Consider referral to advanced HYPERTENSION clinic      Relevant Medications   olmesartan  (BENICAR ) 40 MG tablet   spironolactone  (ALDACTONE ) 25 MG tablet   Other Relevant Orders   Comprehensive metabolic panel with GFR   Hyperglycemia   Relevant Orders   Hemoglobin A1c   Other Visit Diagnoses       Immunization due       Relevant Orders   Pneumococcal conjugate vaccine 20-valent (Prevnar 20) (Completed)      Return in about 4 weeks (around 12/05/2023) for HTN.     Roselie Mood, NP

## 2023-11-07 NOTE — Patient Instructions (Signed)
 Schedule fasting lab appt. In 1week. Need to be fasting 8hrs prior to blood draw.  Ok to drink water and take BP meds. Maintain Heart healthy diet and daily exercise.

## 2023-11-10 ENCOUNTER — Other Ambulatory Visit (HOSPITAL_COMMUNITY): Payer: Self-pay

## 2023-11-10 ENCOUNTER — Ambulatory Visit (INDEPENDENT_AMBULATORY_CARE_PROVIDER_SITE_OTHER): Admitting: Family Medicine

## 2023-11-10 ENCOUNTER — Encounter: Payer: Self-pay | Admitting: Family Medicine

## 2023-11-10 VITALS — BP 166/84 | Ht 62.0 in | Wt 246.0 lb

## 2023-11-10 DIAGNOSIS — M19012 Primary osteoarthritis, left shoulder: Secondary | ICD-10-CM

## 2023-11-10 DIAGNOSIS — M19011 Primary osteoarthritis, right shoulder: Secondary | ICD-10-CM | POA: Diagnosis not present

## 2023-11-10 DIAGNOSIS — I10 Essential (primary) hypertension: Secondary | ICD-10-CM

## 2023-11-10 NOTE — Patient Instructions (Signed)
 Dr. Josefina Beverley [facility] [address] [place] [phone]

## 2023-11-10 NOTE — Progress Notes (Signed)
 DATE OF VISIT: 11/10/2023        Jennifer [name] DOB: 05/01/1960 MRN: [id number]  CC:  f/u bilateral shoulder pain  History of present Illness: Jennifer [name] is a 63 y.o. female who presents for a follow-up visit for bilateral shoulder pain Last seen by me 09/09/23. After that visit was sent for updated x-rays X-rays completed showing worsening bilateral shoulder osteoarthritis, moderate to severe in both shoulders She continues to has ongoing pain, no improvement since last visit No improvement with physical therapy or prior cortisone injections Has been using Aleve  as needed with some improvement Denies any numbness or tingling or radicular symptoms  Medications:  Outpatient Encounter Medications as of 11/10/2023  Medication Sig   amLODipine  (NORVASC ) 10 MG tablet Take 1 tablet (10 mg total) by mouth at bedtime.   carvedilol  (COREG ) 25 MG tablet Take 1 tablet (25 mg total) by mouth 2 (two) times daily with a meal.   lidocaine  (LIDODERM ) 5 % Place 1 patch onto the skin daily. Remove & Discard patch within 12 hours or as directed by MD   MAGNESIUM PO Take by mouth.    Multiple Vitamin (MULTIVITAMIN WITH MINERALS) TABS tablet Take 1 tablet by mouth at bedtime. ALIVE multi-vitamin   olmesartan  (BENICAR ) 40 MG tablet Take 1 tablet (40 mg total) by mouth daily.   OVER THE COUNTER MEDICATION Herbal Tea daily    spironolactone  (ALDACTONE ) 25 MG tablet Take 1 tablet (25 mg total) by mouth daily.   No facility-administered encounter medications on file as of 11/10/2023.    Allergies: is allergic to ace inhibitors and codeine.  Physical Examination: Vitals: BP (!) 166/84   Ht 5' 2 (1.575 m)   Wt 246 lb (111.6 kg)   BMI 44.99 kg/m  GENERAL:  Jennifer [name] is a 63 y.o. female appearing their stated age, alert and oriented x 3, in no apparent distress.  MSK: Shoulder: Bilateral shoulder without gross deformity.  Decreased range of motion in all planes limited by approximately 50%  with associated pain.  Rotator cuff strength 4 -/5 throughout.  Negative drop arm bilaterally. Neurovascular intact distally  Radiology: Bilateral shoulder x-rays AP/axillary/scapular Y views completed 09/09/2023 personally reviewed and interpreted by me today showing: -Left shoulder with worsening osteoarthritis which is now moderate to severe.  Has significant spurring along the humeral head, as well as cystic change at the glenoid mild AC degenerative changes as well - Right shoulder with worsening osteoarthritis which is now moderate to severe.  Has spurring along the humeral head.  Mild AC degenerative changes as well  Assessment & Plan Primary osteoarthritis of shoulders, bilateral Chronic bilateral shoulder pain with moderate to severe osteoarthritis, worse on the left compared to the right.  No improvement with prior cortisone injections, PT, oral NSAIDs  Plan: - X-rays reviewed with patient in detail - She has failed extensive conservative therapy with limited improvement.  Discussed further treatment options including trial of platelet rich plasma versus referral for possible shoulder replacement.  She is interested in pursuing surgical intervention.  Referral to Dr. Sidra Olmsted at [facility] was placed today.  She will schedule an appoint with him to further discuss treatment options - Can continue her home exercises and NSAIDs as needed - She will follow-up with me on an as-needed basis, encouraged to reach out with any questions or concerns Essential hypertension Elevated blood pressure noted today  Plan: - Should continue her current blood pressure medications as prescribed by PCP -  Should follow-up with PCP as scheduled   Patient expressed understanding & agreement with above.  Encounter Diagnoses  Name Primary?   Primary osteoarthritis of shoulders, bilateral Yes   Essential hypertension     No orders of the defined types were placed in this  encounter.

## 2023-11-10 NOTE — Assessment & Plan Note (Signed)
 Elevated blood pressure noted today  Plan: - Should continue her current blood pressure medications as prescribed by PCP - Should follow-up with PCP as scheduled

## 2023-11-11 NOTE — Addendum Note (Signed)
 Addended by: MARTHA BOUCHARD E on: 11/11/2023 03:34 PM   Modules accepted: Orders

## 2023-11-14 ENCOUNTER — Other Ambulatory Visit (INDEPENDENT_AMBULATORY_CARE_PROVIDER_SITE_OTHER)

## 2023-11-14 DIAGNOSIS — E7800 Pure hypercholesterolemia, unspecified: Secondary | ICD-10-CM

## 2023-11-14 DIAGNOSIS — R739 Hyperglycemia, unspecified: Secondary | ICD-10-CM

## 2023-11-14 DIAGNOSIS — I10 Essential (primary) hypertension: Secondary | ICD-10-CM | POA: Diagnosis not present

## 2023-11-14 LAB — COMPREHENSIVE METABOLIC PANEL WITH GFR
ALT: 14 U/L (ref 0–35)
AST: 16 U/L (ref 0–37)
Albumin: 4.2 g/dL (ref 3.5–5.2)
Alkaline Phosphatase: 67 U/L (ref 39–117)
BUN: 16 mg/dL (ref 6–23)
CO2: 29 meq/L (ref 19–32)
Calcium: 9.7 mg/dL (ref 8.4–10.5)
Chloride: 104 meq/L (ref 96–112)
Creatinine, Ser: 0.78 mg/dL (ref 0.40–1.20)
GFR: 79.99 mL/min (ref >60.00)
Glucose, Bld: 98 mg/dL (ref 70–99)
Potassium: 4 meq/L (ref 3.5–5.1)
Sodium: 141 meq/L (ref 135–145)
Total Bilirubin: 0.6 mg/dL (ref 0.2–1.2)
Total Protein: 7.6 g/dL (ref 6.0–8.3)

## 2023-11-14 LAB — LIPID PANEL
Cholesterol: 175 mg/dL (ref 0–200)
HDL: 49.5 mg/dL (ref >39.00)
LDL Cholesterol: 113 mg/dL — ABNORMAL HIGH (ref 0–99)
NonHDL: 125.14
Total CHOL/HDL Ratio: 4
Triglycerides: 62 mg/dL (ref 0.0–149.0)
VLDL: 12.4 mg/dL (ref 0.0–40.0)

## 2023-11-14 LAB — HEMOGLOBIN A1C: Hgb A1c MFr Bld: 5.9 % (ref 4.6–6.5)

## 2023-11-18 ENCOUNTER — Ambulatory Visit: Payer: Self-pay | Admitting: Nurse Practitioner

## 2023-11-24 ENCOUNTER — Other Ambulatory Visit (HOSPITAL_COMMUNITY): Payer: Self-pay

## 2023-12-01 ENCOUNTER — Other Ambulatory Visit: Payer: Self-pay | Admitting: Family Medicine

## 2023-12-01 ENCOUNTER — Other Ambulatory Visit: Payer: Self-pay | Admitting: Surgical

## 2023-12-01 DIAGNOSIS — Z1231 Encounter for screening mammogram for malignant neoplasm of breast: Secondary | ICD-10-CM

## 2023-12-01 DIAGNOSIS — G8929 Other chronic pain: Secondary | ICD-10-CM

## 2023-12-02 ENCOUNTER — Ambulatory Visit

## 2023-12-02 ENCOUNTER — Other Ambulatory Visit: Payer: Self-pay | Admitting: Nurse Practitioner

## 2023-12-02 DIAGNOSIS — Z1231 Encounter for screening mammogram for malignant neoplasm of breast: Secondary | ICD-10-CM

## 2023-12-03 ENCOUNTER — Telehealth: Payer: Self-pay | Admitting: Nurse Practitioner

## 2023-12-03 ENCOUNTER — Inpatient Hospital Stay: Admission: RE | Admit: 2023-12-03 | Discharge: 2023-12-03 | Attending: Surgical

## 2023-12-03 DIAGNOSIS — G8929 Other chronic pain: Secondary | ICD-10-CM

## 2023-12-03 NOTE — Telephone Encounter (Signed)
 Called and left a voice message asking to give me a call back at the office at [phone]

## 2023-12-03 NOTE — Telephone Encounter (Signed)
 See note

## 2023-12-03 NOTE — Telephone Encounter (Signed)
 Patient returned my call and I informed her reason for my call. Patient stated that she does not have bp readings for last week. I instructed to take her blood pressure as instructed by Roselie starting tomorrow morning, Friday, Saturday, Sunday Monday and Tuesday and I will give her a call Tuesday afternoon or Wednesday morning to get the readings. Patient informed me that she has an appointment on Monday 12/08/23 in the morning. I advised her to take it starting tomorrow morning into the weekend and bring the readings with her to the appointment.

## 2023-12-08 ENCOUNTER — Other Ambulatory Visit (HOSPITAL_COMMUNITY): Payer: Self-pay

## 2023-12-08 ENCOUNTER — Encounter: Payer: Self-pay | Admitting: Nurse Practitioner

## 2023-12-08 ENCOUNTER — Ambulatory Visit (INDEPENDENT_AMBULATORY_CARE_PROVIDER_SITE_OTHER): Admitting: Nurse Practitioner

## 2023-12-08 VITALS — BP 158/96 | HR 62 | Temp 97.3°F | Ht 62.0 in | Wt 249.2 lb

## 2023-12-08 DIAGNOSIS — G4733 Obstructive sleep apnea (adult) (pediatric): Secondary | ICD-10-CM | POA: Diagnosis not present

## 2023-12-08 DIAGNOSIS — I1A0 Resistant hypertension: Secondary | ICD-10-CM

## 2023-12-08 LAB — BASIC METABOLIC PANEL WITH GFR
BUN: 13 mg/dL (ref 6–23)
CO2: 27 meq/L (ref 19–32)
Calcium: 9.5 mg/dL (ref 8.4–10.5)
Chloride: 103 meq/L (ref 96–112)
Creatinine, Ser: 0.71 mg/dL (ref 0.40–1.20)
GFR: 89.5 mL/min (ref >60.00)
Glucose, Bld: 101 mg/dL — ABNORMAL HIGH (ref 70–99)
Potassium: 4.1 meq/L (ref 3.5–5.1)
Sodium: 137 meq/L (ref 135–145)

## 2023-12-08 LAB — MICROALBUMIN / CREATININE URINE RATIO
Creatinine,U: 19.5 mg/dL
Microalb Creat Ratio: UNDETERMINED mg/g (ref 0.0–30.0)
Microalb, Ur: <0.7 mg/dL

## 2023-12-08 LAB — TSH: TSH: 1.83 u[IU]/mL (ref 0.35–5.50)

## 2023-12-08 LAB — CORTISOL: Cortisol, Plasma: 5.5 ug/dL

## 2023-12-08 MED ORDER — TRIAMTERENE-HCTZ 37.5-25 MG PO TABS
1.0000 | ORAL_TABLET | Freq: Every day | ORAL | 3 refills | Status: AC
  Filled 2023-12-08: qty 30, 30d supply, fill #0
  Filled 2024-01-12: qty 30, 30d supply, fill #1
  Filled 2024-02-10: qty 30, 30d supply, fill #2
  Filled 2024-03-10: qty 30, 30d supply, fill #3

## 2023-12-08 NOTE — Patient Instructions (Signed)
 Stop spironolactone  Start maxzide Continue other med doses Go to lab

## 2023-12-08 NOTE — Assessment & Plan Note (Signed)
 Entered referral to sleep clinic Persistent uncontrolled HYPERTENSION despite med and diet compliance

## 2023-12-08 NOTE — Progress Notes (Signed)
 Established Patient Visit  Patient: Jennifer [name]   DOB: 1960/08/23   63 y.o. Female  MRN: [id number] Visit Date: 12/08/2023  Subjective:    Chief Complaint  Patient presents with   Follow-up    4 week follow up for HTN, has home BP readings on her phone     HPI OSA (obstructive sleep apnea) Entered referral to sleep clinic Persistent uncontrolled HYPERTENSION despite med and diet compliance  Essential hypertension Home BP readings: 157/95, 143/87, 127/103, 159/84, 154/93, 164/96. Unable to tolerate spirolonactone: leg pain. She stopped med 5days ago. Reports she is compliant with use of CPAP machine and med doses (olmesartan  40mg , coreg  25mg  BID, and amlodipine  10mg ) Reports she is compliant with DASH diet and exercise 3x/week-brisk walking BP Readings from Last 3 Encounters:  12/08/23 (!) 160/100  11/10/23 (!) 166/84  11/07/23 (!) 150/98    Check BMP, tsh, aldosterone-renin ratio, metanephrine serum, cortisol, and ECG ECG today: NSR, no change in ST segment and T waves compared to 04/2020 ECG. Stop spironolactone  Add maxzide 37.5/25mg  Entered referral to advanced HYPERTENSION clinic F/up in 2weeks  Reviewed medical, surgical, and social history today  Medications: Outpatient Medications Prior to Visit  Medication Sig   amLODipine  (NORVASC ) 10 MG tablet Take 1 tablet (10 mg total) by mouth at bedtime.   carvedilol  (COREG ) 25 MG tablet Take 1 tablet (25 mg total) by mouth 2 (two) times daily with a meal.   lidocaine  (LIDODERM ) 5 % Place 1 patch onto the skin daily. Remove & Discard patch within 12 hours or as directed by MD   MAGNESIUM PO Take by mouth.    Multiple Vitamin (MULTIVITAMIN WITH MINERALS) TABS tablet Take 1 tablet by mouth at bedtime. ALIVE multi-vitamin   olmesartan  (BENICAR ) 40 MG tablet Take 1 tablet (40 mg total) by mouth daily.   OVER THE COUNTER MEDICATION Herbal Tea daily    [DISCONTINUED] spironolactone  (ALDACTONE ) 25 MG tablet  Take 1 tablet (25 mg total) by mouth daily.   No facility-administered medications prior to visit.   Reviewed past medical and social history.   ROS per HPI above      Objective:  BP (!) 160/100 (BP Location: Left Arm, Patient Position: Sitting, Cuff Size: Large)   Pulse 62   Temp (!) 97.3 F (36.3 C) (Temporal)   Ht 5' 2 (1.575 m)   Wt 249 lb 3.2 oz (113 kg)   SpO2 99%   BMI 45.58 kg/m      Physical Exam Vitals and nursing note reviewed.  Constitutional:      Appearance: She is obese.  Cardiovascular:     Rate and Rhythm: Normal rate and regular rhythm.     Pulses: Normal pulses.     Heart sounds: Normal heart sounds.  Pulmonary:     Effort: Pulmonary effort is normal.     Breath sounds: Normal breath sounds.  Musculoskeletal:     Right lower leg: Edema present.     Left lower leg: Edema present.  Neurological:     Mental Status: She is alert and oriented to person, place, and time.     No results found for any visits on 12/08/23.    Assessment & Plan:    Problem List Items Addressed This Visit     Essential hypertension - Primary   Home BP readings: 157/95, 143/87, 127/103, 159/84, 154/93, 164/96. Unable to tolerate spirolonactone: leg pain. She stopped  med 5days ago. Reports she is compliant with use of CPAP machine and med doses (olmesartan  40mg , coreg  25mg  BID, and amlodipine  10mg ) Reports she is compliant with DASH diet and exercise 3x/week-brisk walking BP Readings from Last 3 Encounters:  12/08/23 (!) 160/100  11/10/23 (!) 166/84  11/07/23 (!) 150/98    Check BMP, tsh, aldosterone-renin ratio, metanephrine serum, cortisol, and ECG ECG today: NSR, no change in ST segment and T waves compared to 04/2020 ECG. Stop spironolactone  Add maxzide 37.5/25mg  Entered referral to advanced HYPERTENSION clinic F/up in 2weeks      Relevant Medications   triamterene-hydrochlorothiazide  (MAXZIDE-25) 37.5-25 MG tablet   OSA (obstructive sleep apnea)   Entered  referral to sleep clinic Persistent uncontrolled HYPERTENSION despite med and diet compliance      Relevant Orders   Ambulatory referral to [facility]   Ambulatory referral to Sleep Studies   Return in about 2 weeks (around 12/22/2023) for HTN.     Roselie Mood, NP

## 2023-12-08 NOTE — Assessment & Plan Note (Addendum)
 Home BP readings: 157/95, 143/87, 127/103, 159/84, 154/93, 164/96. Unable to tolerate spirolonactone: leg pain. She stopped med 5days ago. Reports she is compliant with use of CPAP machine and med doses (olmesartan  40mg , coreg  25mg  BID, and amlodipine  10mg ) Reports she is compliant with DASH diet and exercise 3x/week-brisk walking CT abd 2021: normal kidneys and adrenal gland. BP Readings from Last 3 Encounters:  12/08/23 (!) 160/100  11/10/23 (!) 166/84  11/07/23 (!) 150/98    Check BMP, tsh, aldosterone-renin ratio, metanephrine serum, cortisol, and ECG ECG today: NSR, no change in ST segment and T waves compared to 04/2020 ECG. Stop spironolactone  Add maxzide 37.5/25mg  Entered referral to advanced HYPERTENSION clinic F/up in 2weeks

## 2023-12-09 ENCOUNTER — Other Ambulatory Visit: Payer: Self-pay

## 2023-12-09 ENCOUNTER — Ambulatory Visit: Payer: Self-pay | Admitting: Nurse Practitioner

## 2023-12-26 ENCOUNTER — Encounter: Payer: Self-pay | Admitting: Nurse Practitioner

## 2023-12-26 ENCOUNTER — Other Ambulatory Visit (HOSPITAL_COMMUNITY): Payer: Self-pay

## 2023-12-26 ENCOUNTER — Ambulatory Visit (INDEPENDENT_AMBULATORY_CARE_PROVIDER_SITE_OTHER): Admitting: Nurse Practitioner

## 2023-12-26 VITALS — BP 142/88 | HR 64 | Temp 97.6°F | Ht 62.0 in | Wt 247.0 lb

## 2023-12-26 DIAGNOSIS — I1A0 Resistant hypertension: Secondary | ICD-10-CM

## 2023-12-26 MED ORDER — HYDRALAZINE HCL 25 MG PO TABS
25.0000 mg | ORAL_TABLET | Freq: Two times a day (BID) | ORAL | 5 refills | Status: DC
  Filled 2023-12-26: qty 60, 30d supply, fill #0

## 2023-12-26 NOTE — Telephone Encounter (Signed)
 Printed and given to provider at today's appointment

## 2023-12-26 NOTE — Patient Instructions (Addendum)
 Stop amlodipine  Start hydralazine 25mg  BID Maintain other med doses You will be contacted to schedule appointment for renal ultrasound Maintain a low salt/low fat/low sugar/low carb diet Maintain daily exercise (walking or home video ) Maintain appointment with sleep specialist and HYPERTENSION clinic

## 2023-12-26 NOTE — Progress Notes (Signed)
 Established Patient Visit  Patient: Jennifer [name]   DOB: November 18, 1960   63 y.o. Female  MRN: [id number] Visit Date: 12/26/2023  Subjective:    Chief Complaint  Patient presents with   Follow-up    Follow up for HTN-Has not been taking the Amlodipine  restarted last night causing to hurt   HPI Resistant hypertension Reports non compliance with amlodipine  10mg . She has been taking med 2x/week and noticed myalgia with each dose.  Denies any adverse effects with olmesartan  40, maxizde 37.5/25, and coreg  25 BID Reports she is compliant with low salt diet and exercise 2x/week Home BP readings 150s-130s/80s-90s asymptomatic Normal plasma metanephedrine, Tsh, renal function, and cortisol  pending renin-aldosterone She has appointment with sleep specialist to eval CPAP on 01/19/2024, appointment with HYPERTENSION clinic on 03/01/2024. BP Readings from Last 3 Encounters:  12/26/23 (!) 142/88  12/08/23 (!) 158/96  11/10/23 (!) 166/84    Add hydralazine 25mg  BID Stop amlodipine  Maintain other med doses Encouraged to maintain DASH diet and daily exercise Check renal duplex F/up in 4weeks   Wt Readings from Last 3 Encounters:  12/26/23 247 lb (112 kg)  12/08/23 249 lb 3.2 oz (113 kg)  11/10/23 246 lb (111.6 kg)    Reviewed medical, surgical, and social history today  Medications: Outpatient Medications Prior to Visit  Medication Sig Note   carvedilol  (COREG ) 25 MG tablet Take 1 tablet (25 mg total) by mouth 2 (two) times daily with a meal.    MAGNESIUM PO Take by mouth.     Multiple Vitamin (MULTIVITAMIN WITH MINERALS) TABS tablet Take 1 tablet by mouth at bedtime. ALIVE multi-vitamin    olmesartan  (BENICAR ) 40 MG tablet Take 1 tablet (40 mg total) by mouth daily.    triamterene-hydrochlorothiazide  (MAXZIDE-25) 37.5-25 MG tablet Take 1 tablet by mouth daily.    [DISCONTINUED] amLODipine  (NORVASC ) 10 MG tablet Take 1 tablet (10 mg total) by mouth at bedtime.  12/26/2023: myalgia   [DISCONTINUED] lidocaine  (LIDODERM ) 5 % Place 1 patch onto the skin daily. Remove & Discard patch within 12 hours or as directed by MD    [DISCONTINUED] OVER THE COUNTER MEDICATION Herbal Tea daily  (Patient not taking: Reported on 12/26/2023)    No facility-administered medications prior to visit.   Reviewed past medical and social history.   ROS per HPI above      Objective:  BP (!) 142/88 (BP Location: Left Arm, Patient Position: Sitting, Cuff Size: Large)   Pulse 64   Temp 97.6 F (36.4 C) (Oral)   Ht 5' 2 (1.575 m)   Wt 247 lb (112 kg)   SpO2 97%   BMI 45.18 kg/m      Physical Exam Vitals and nursing note reviewed.  Constitutional:      Appearance: She is obese.  Cardiovascular:     Rate and Rhythm: Normal rate and regular rhythm.     Pulses: Normal pulses.     Heart sounds: Normal heart sounds.  Pulmonary:     Effort: Pulmonary effort is normal.     Breath sounds: Normal breath sounds.  Musculoskeletal:     Right lower leg: No edema.     Left lower leg: No edema.  Neurological:     Mental Status: She is alert and oriented to person, place, and time.  Psychiatric:        Mood and Affect: Mood normal.  Behavior: Behavior normal.        Thought Content: Thought content normal.     No results found for any visits on 12/26/23.    Assessment & Plan:    Problem List Items Addressed This Visit     Resistant hypertension - Primary   Reports non compliance with amlodipine  10mg . She has been taking med 2x/week and noticed myalgia with each dose.  Denies any adverse effects with olmesartan  40, maxizde 37.5/25, and coreg  25 BID Reports she is compliant with low salt diet and exercise 2x/week Home BP readings 150s-130s/80s-90s asymptomatic Normal plasma metanephedrine, Tsh, renal function, and cortisol  pending renin-aldosterone She has appointment with sleep specialist to eval CPAP on 01/19/2024, appointment with HYPERTENSION clinic on  03/01/2024. BP Readings from Last 3 Encounters:  12/26/23 (!) 142/88  12/08/23 (!) 158/96  11/10/23 (!) 166/84    Add hydralazine 25mg  BID Stop amlodipine  Maintain other med doses Encouraged to maintain DASH diet and daily exercise Check renal duplex F/up in 4weeks       Relevant Medications   hydrALAZINE (APRESOLINE) 25 MG tablet   Other Relevant Orders   VAS US  RENAL ARTERY DUPLEX   Return in about 8 weeks (around 02/20/2024) for HTN (BMP).     Roselie Mood, NP

## 2023-12-26 NOTE — Assessment & Plan Note (Addendum)
 Reports non compliance with amlodipine  10mg . She has been taking med 2x/week and noticed myalgia with each dose.  Denies any adverse effects with olmesartan  40, maxizde 37.5/25, and coreg  25 BID Reports she is compliant with low salt diet and exercise 2x/week Home BP readings 150s-130s/80s-90s asymptomatic Normal plasma metanephedrine, Tsh, renal function, and cortisol  pending renin-aldosterone She has appointment with sleep specialist to eval CPAP on 01/19/2024, appointment with HYPERTENSION clinic on 03/01/2024. BP Readings from Last 3 Encounters:  12/26/23 (!) 142/88  12/08/23 (!) 158/96  11/10/23 (!) 166/84    Add hydralazine 25mg  BID Stop amlodipine  Maintain other med doses Encouraged to maintain DASH diet and daily exercise Check renal duplex F/up in 4weeks

## 2023-12-29 LAB — ALDOSTERONE + RENIN ACTIVITY W/ RATIO
ALDO / PRA Ratio: 44.4 ratio — ABNORMAL HIGH (ref 0.9–28.9)
Aldosterone: 8 ng/dL
Renin Activity: 0.18 ng/mL/h — ABNORMAL LOW (ref 0.25–5.82)

## 2023-12-29 LAB — METANEPHRINES, PLASMA
Metanephrine, Free: 38 pg/mL (ref <=57)
Normetanephrine, Free: 109 pg/mL (ref <=148)
Total Metanephrines-Plasma: 147 pg/mL (ref <=205)

## 2023-12-29 LAB — EXTRA LAV TOP TUBE

## 2024-01-06 ENCOUNTER — Ambulatory Visit (HOSPITAL_COMMUNITY)
Admission: RE | Admit: 2024-01-06 | Discharge: 2024-01-06 | Disposition: A | Discharge status: Home / Self Care | Source: Ambulatory Visit | Attending: Nurse Practitioner | Admitting: Nurse Practitioner

## 2024-01-06 ENCOUNTER — Ambulatory Visit: Payer: Self-pay | Admitting: Nurse Practitioner

## 2024-01-06 DIAGNOSIS — I1A0 Resistant hypertension: Secondary | ICD-10-CM | POA: Insufficient documentation

## 2024-01-12 ENCOUNTER — Other Ambulatory Visit (HOSPITAL_COMMUNITY): Payer: Self-pay

## 2024-01-19 ENCOUNTER — Ambulatory Visit: Admitting: Neurology

## 2024-01-19 ENCOUNTER — Other Ambulatory Visit (HOSPITAL_COMMUNITY): Payer: Self-pay

## 2024-01-19 ENCOUNTER — Encounter: Payer: Self-pay | Admitting: Neurology

## 2024-01-19 VITALS — BP 136/84 | HR 69 | Ht 62.0 in | Wt 242.0 lb

## 2024-01-19 DIAGNOSIS — Z6841 Body Mass Index (BMI) 40.0 and over, adult: Secondary | ICD-10-CM

## 2024-01-19 DIAGNOSIS — I10 Essential (primary) hypertension: Secondary | ICD-10-CM | POA: Diagnosis not present

## 2024-01-19 DIAGNOSIS — E66813 Obesity, class 3: Secondary | ICD-10-CM | POA: Diagnosis not present

## 2024-01-19 DIAGNOSIS — G4733 Obstructive sleep apnea (adult) (pediatric): Secondary | ICD-10-CM | POA: Diagnosis not present

## 2024-01-19 DIAGNOSIS — I4891 Unspecified atrial fibrillation: Secondary | ICD-10-CM

## 2024-01-19 DIAGNOSIS — D509 Iron deficiency anemia, unspecified: Secondary | ICD-10-CM

## 2024-01-19 MED ORDER — TRAMADOL HCL 50 MG PO TABS
50.0000 mg | ORAL_TABLET | Freq: Four times a day (QID) | ORAL | 0 refills | Status: DC | PRN
  Filled 2024-01-19: qty 20, 5d supply, fill #0

## 2024-01-19 MED ORDER — SENNOSIDES 8.6 MG PO TABS
ORAL_TABLET | ORAL | 0 refills | Status: DC
  Filled 2024-01-19: qty 30, 15d supply, fill #0

## 2024-01-19 MED ORDER — METHOCARBAMOL 500 MG PO TABS
500.0000 mg | ORAL_TABLET | Freq: Three times a day (TID) | ORAL | 0 refills | Status: DC | PRN
  Filled 2024-01-19: qty 30, 10d supply, fill #0

## 2024-01-19 MED ORDER — ONDANSETRON 4 MG PO TBDP
4.0000 mg | ORAL_TABLET | Freq: Three times a day (TID) | ORAL | 0 refills | Status: DC | PRN
  Filled 2024-01-19: qty 9, 3d supply, fill #0

## 2024-01-19 NOTE — Progress Notes (Addendum)
 @[facility]   Provider:  Dedra Gores, MD   Primary Care Physician:  Jennifer Jennifer Rockford, NP [address] [place] [address]   Referring Provider: Katheen Jennifer Rockford, Np [address] [place],  [place] [address]        Chief Concern for this Consultation:   Patient presents with          HPI: I have the pleasure of meeting with Jennifer [name] , on 01/19/24 , who is a 63 y.o.  female patient,  seen upon a referral by Jennifer [name] Katheen, NP,    for a new  Sleep Medicine Consultation.  The patient's referral information asked for a retesting  due to  uncontrolled HTN , atrial fibrillation and Obesity : while using CPAP for OSA diagnosed in 2017 by Dr Burnard. .   Chief concern according to patient:  I fall asleep in daytime , while  at work but that's after lunch.  I have blood pressure issues. I am not anticoagulated, but have atrial fib. 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   ENT problems: (Sinusitis seasonal ), sleep walking in childhood, No head Trauma hx,  no Syncope,  positive for remote Anemia, remote hx of GERD,  denies Thyroid  disease or DM or other endocrinological disorders,  . This patient had a previous sleep study/ studies in the year 2017 under Dr Burnard at [facility] from Dr Burnard 01-2016 last sleep study : SPLIT protocol order:.    Moderate obstructive sleep apnea overall during the diagnostic portion of the study (AHI = 26.5/hour); however, events were severe during REM Sleep (AHI 78.4/h).  CPAP was initiated at 5 cm and was titrated to 13 cm water pressure. An optimal PAP pressure was 13 cm of  water pressure; AHI 0. - No significant central sleep apnea occurred during the diagnostic portion of the study (CAI = 0.0/hour). - Significant oxygen desaturation was noted during the diagnostic portion of the study  to a nadir of 74% during NREM and 69.00% during REM sleep.    Family medical history: There are  biological family members affected by OSA-Sleep apnea ( siblings ( younger brother ) , not parents  ), or Insomnia () , by excessive daytime sleepiness ().     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 .  She lives in a private home, in a household with  2 of her adult children,  no pets (/).   Family status is divorced , with 3 adult children, 3 grandchildren.  The patient currently works.  The workplace involves little to no physical activity, no outdoor activity, no travel.  Nicotine use: / ( quit 2006) .  ETOH use: /,  Caffeine  intake in form of: Coffee (/), Soft drinks (/), Tea ( 3-4 times a week- 3 Arnold Palmers) no Energy drinks ( including those containing  taurine ). Caffeine  is last consumed at 7 pm . .  Exercises regularly; no   in form of walking , sporadically.  Hobbies / Volunteering:  member of a church - engagements).      Sleep habits and  routines are as follows: The patient's dinner time is around 6-7 PM.  Evening time is spent by watching TV.  The patient goes to bed at, or close to, 10 PM.  The bedroom is not shared  and is described as cool, quiet, and dark after the TV is off.  . The patient reports that it takes 5 minutes to fall asleep, then continues to sleep for 5-8 hours, uninterrupted or woken up by the need to void (Nocturia).   The preferred sleep position is supine ( shoulder pain) , with support of 2-3 pillows, (non- adjustable bed/ recliner ). The total estimated sleep time is circa 5-8 hours.  Dreams are reportedly frequent/ and can be vivid. Dream enactment has not been reported.   4 AM is the  usual week- day rise time. The patient wakes up spontaneously or through her phone  at 4 AM. - snoozes the phone.  She reports not feeling refreshed and restored in the morning, waking with symptoms such as dry mouth, morning headaches, stiffness or pain, and fatigue.   No sleep paralysis has been experienced.  Naps in daytime are taken infrequently (there is a desire to nap and rarely an opportunity),  at lunchtime lasting from 20 to 30 and have a refreshing quality.  These do not interfere with nocturnal sleep.    Review of Systems: Out of a complete 14 system review, the patient complains of only the following symptoms, and all other reviewed systems are negative.:  Hypersomnia    Pain: shoulder, Headaches in AM are rare    Snoring, Sleep fragmentation, Nocturia times 2-3,     How likely are you to doze in the following situations: 0 = not likely, 1 = slight chance, 2 = moderate chance, 3 = high chance Sitting and Reading? Watching Television? Sitting inactive in a public place (theater or meeting)? As a passenger in a car for an hour without a break? Lying down in the afternoon when circumstances permit? Sitting and talking to someone? Sitting quietly after lunch without alcohol? In a car, while stopped for a few minutes in traffic?   Total ESS =4 / 24 points.    FSS endorsed at 21/ 63 points.  GDS: 1/ 15    Past Medical History:  Diagnosis Date   Allergy    allergic rhinitis   Anemia 06/2007   C-scope 06/2007: tics, neg hemocults 07/2007   Anxiety    Blood transfusion without reported diagnosis    at birth   Chest pain 12/2007   negative ECHO, neg stress test 06/2008   Condyloma acuminatum    history of   Hyperglycemia    borderline. A1c 5.7  12/2007   Hypertension    Personal history of atrial fibrillation    on carvedilol  only    Sleep apnea    wears cpap at night    Social History   Socioeconomic History   Marital status: Divorced    Spouse name: Not on  file   Number of children: 3   Years of education: Not on file   Highest education level: Not on file  Occupational History    Employer: [employer]  Tobacco Use   Smoking status: Former    Current packs/day: 0.00    Average packs/day: 0.5 packs/day for 27.0 years (13.5 ttl pk-yrs)    Types: Cigarettes    Start date: 02/11/1977    Quit date: 02/12/2004    Years since quitting: 19.9   Smokeless tobacco:  Never  Substance and Sexual Activity   Alcohol use: No   Drug use: No   Sexual activity: Not Currently    Birth control/protection: Post-menopausal, Surgical    Comment: s/p hysterectomy  Other Topics Concern   Not on file  Social History Narrative   Regular exercise  No   Caffeine -- 1 daily   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   Divorced 2013   3 children (all with her)   Enjoys church   1 dog (1/2 poodle, 1/2 shitzu)   Social Drivers of Corporate Investment Banker Strain: Not on file  Food Insecurity: Not on file  Transportation Needs: Not on file  Physical Activity: Not on file  Stress: Not on file  Social Connections: Not on file    Family History  Problem Relation Age of Onset   Breast cancer Paternal Aunt    Cervical cancer Cousin    Diabetes Mother    Coronary artery disease Father    Kidney disease Father    Diabetes Father    Hypertension Father    Prostate cancer Father    Coronary artery disease Sister    Kidney disease Sister    Diabetes Sister    Hypertension Sister    Kidney disease Brother    Stroke Brother    Hypertension Brother        x3   Diabetes Maternal Grandmother    Stroke Maternal Grandmother    Diabetes Paternal Grandmother    Stroke Paternal Grandmother    Coronary artery disease Other        grandmother, uncle   Kidney disease Other        grandmother   Stroke Other        uncle   Thyroid  disease Other        aunts   Glaucoma Other        grandmother   Hypertension  Other        grandmother   Diabetes Other        aunts   Hypothyroidism Other        aunt   Hyperthyroidism Other        aunt   Healthy Child    Healthy Child    Healthy Child    Colon cancer Neg Hx    Colon polyps Neg Hx    Esophageal cancer Neg Hx    Rectal cancer Neg Hx    Stomach cancer Neg Hx     Past Medical History:  Diagnosis Date   Allergy    allergic rhinitis   Anemia 06/2007   C-scope 06/2007: tics, neg hemocults 07/2007   Anxiety    Blood transfusion without reported diagnosis    at birth   Chest pain 12/2007   negative ECHO, neg stress test 06/2008   Condyloma acuminatum    history of   Hyperglycemia    borderline. A1c 5.7  12/2007   Hypertension    Personal history of atrial fibrillation    on carvedilol  only    Sleep apnea    wears cpap at night    Past Surgical History:  Procedure Laterality Date   ABDOMINAL HYSTERECTOMY     no oophorectomy   BREAST EXCISIONAL BIOPSY     BREAST SURGERY  age 25   Fibroid tumor removed right breast   CARPAL TUNNEL RELEASE     CESAREAN SECTION     CHOLECYSTECTOMY     COLONOSCOPY  Current Outpatient Medications on File Prior to Visit  Medication Sig Dispense Refill   carvedilol  (COREG ) 25 MG tablet Take 1 tablet (25 mg total) by mouth 2 (two) times daily with a meal. 180 tablet 3   MAGNESIUM PO Take by mouth.      Multiple Vitamin (MULTIVITAMIN WITH MINERALS) TABS tablet Take 1 tablet by mouth at bedtime. ALIVE multi-vitamin     olmesartan  (BENICAR ) 40 MG tablet Take 1 tablet (40 mg total) by mouth daily. 90 tablet 3   triamterene -hydrochlorothiazide  (MAXZIDE -25) 37.5-25 MG tablet Take 1 tablet by mouth daily. 90 tablet 3   hydrALAZINE  (APRESOLINE ) 25 MG tablet Take 1 tablet (25 mg total) by mouth 2 (two) times daily. (Patient not taking: Reported on 01/19/2024) 60 tablet 5   No current facility-administered medications on file prior to visit.    Allergies  Allergen Reactions   Ace Inhibitors Cough    Codeine Nausea And Vomiting    Vitals:   01/19/24 1447  BP: 136/84  Pulse: 69      Physical exam:   General: The patient was alert and appears not in acute distress.  Mood and affect are appropriate .  She wears a mask.  The patient's interactions are: Cooperative, makes eye contact, follows the instructions and answers questions coherently.  The patient is groomed and appropriately groomed and dressed. Head: Normocephalic, atraumatic.   Neck is supple. Mallampati: 5 ( 3 plus )   The neck circumference measured 18 inches. Nasal airflow was not fully patent ,   Overbite / Retrognathia was noted.  Dental status:  Cardiovascular:  Regular rate and cardiac rhythm by palpable pulse. Respiratory: no audible wheezing, no tachypnoea.   Skin:  With evidence of ankle edema. Bilaterally  No discoloration.  Trunk:  BMI is 44.3 The patient's posture was relaxed.    Neurologic exam : The patient was awake and alert, oriented to place and time.   Attention span & concentration ability appeared normal.  Speech was fluent, without dysarthria, mild  dysphonia , no r aphasia, and of normal volume.     Cranial nerves:  There was no loss of smell or taste reported  Pupils are round, equal in size and briskly reactive to light.  Funduscopic exam was deferred.  Extraocular movements in vertical and horizontal planes were intact and without nystagmus. (No Diplopia reported). Visual fields by finger perimetry are intact. Hearing was intact to soft voice.    Facial sensation intact to fine touch.  Facial motor strength: Symmetric movement and tongue and uvula move midline.  Neck ROM: rotation, tilt and flexion extension were intact for age and shoulder shrug was symmetrical.    Motor exam:  Symmetric bulk, strength and ROM.   Normal tone without cog- wheeling, and symmetric grip strength.   Sensory:    Gait and station: Patient could rise unassisted from a seated position, without bracing,  and walked without  assistive device.  Stance was of  wide width.       I would like to thank Nche, Jennifer Rockford, NP afor allowing me to meet with this patient.    In short, Mrs Robb is presenting with longstanding OSA on CPAP , but had no new interval study or treatment evaluations in 8.5  years ,  is not longer compliant with CPAP use, ( lass than 50% )  Risk factors for OSA were present,  including : Body mass index is 44.26 kg/m.,  her large neck size and upper airway  anatomy. She also has restricted nasal airflow and is a habitual mouth breather.  She is interested to obtain Zepbound  in the setting of OSA and BMI, but insurance last year denied the medication.  She has less overall sleep than she likes and may take power naps at lunch break.      My Plan is to proceed with:  HST-  for current  level of sleep apnea/ OSA and to see if she can go on Zepbound .  Plan B:) SPLIT if HST is denied.    Modafinil/   optional , PRN use for sleepiness when at the job.    OSA risk factor. Has not been seen by Weight treatment clinic.  Sleep hygiene : TV out of the bedroom, avoid screen time, get 7 hours of sleep allocated, if you want to get up at 4 AM you have to go to bed by 8 PM.      I plan to follow up through our NP within 5-6  months.   A total time of  45  minutes consistent of a part of face to face encounter , exam and interview,  and additional preparation time for chart review was spent .  At today's visit, we discussed treatment options, associated risk and benefits, and engage in counseling as needed including, but not limited to:  Sleep hygiene, Quality Sleep Habits, and Safety concerns for patients with daytime sleepiness who are warned to not operate machinery/ motor vehicles when drowsy.  Additionally, the following were reviewed: Past medical records, past medical and surgical history, family and social background, as well as relevant laboratory results, imaging  findings, and medical notes, where applicable.  This note was generated by myself in part by using dictation software, and as a result, it may contain unintentional typos and errors.  Nevertheless, effort was made to accurately convey the pertinent aspects of the patient's visit.   Jennifer Gores, MD  01-19-2024  [facility] and [facility] Sleep Board certified in Sleep Medicine by The [employer] of Sleep Medicine and Diplomate of the [employer] (AASM) . Board certified In Neurology, Diplomat of the ABPN,  Fellow of the [employer] of [employer].

## 2024-01-19 NOTE — Patient Instructions (Signed)
 Living With Sleep Apnea Sleep apnea is a condition that affects your breathing while you're sleeping. Your tongue or the tissue in your throat may block the flow of air while you sleep. You may have shallow breathing or stop breathing for short periods of time. The breaks in breathing interrupt the deep sleep that you need to feel rested. Even if you don't wake up from the gaps in breathing, you may feel tired during the day. People with sleep apnea may snore loudly. You may have a headache in the morning and feel anxious or depressed. How can sleep apnea affect me? Sleep apnea increases your chances of being very tired during the day. This is called daytime fatigue. Sleep apnea can also increase your risk of: Heart attack. Stroke. Obesity. Type 2 diabetes. Heart failure. Irregular heartbeat. High blood pressure. If you are very tired during the day, you may be more likely to: Not do well in school or at work. Fall asleep while driving. Have trouble paying attention. Develop depression or anxiety. Have problems having sex. This is called sexual dysfunction. What actions can I take to manage sleep apnea? Sleep apnea treatment  If you were given a device to open your airway while you sleep, use it only as told by your health care provider. You may be given: An oral appliance. This is a mouthpiece that shifts your lower jaw forward. A continuous positive airway pressure (CPAP) device. This blows air through a mask. A nasal expiratory positive airway pressure (EPAP) device. This has valves that you put into each nostril. A bi-level positive airway pressure (BIPAP) device. This blows air through a mask when you breathe in and breathe out. You may need surgery if other treatments don't work for you. Sleep habits Go to sleep and wake up at the same time every day. This helps set your internal clock for sleeping. If you stay up later than usual on weekends, try to get up in the morning within 2  hours of the time you usually wake up. Try to get at least 7-9 hours of sleep each night. Stop using a computer, tablet, and mobile phone a few hours before bedtime. Do not take long naps during the day. If you nap, limit it to 30 minutes. Have a relaxing bedtime routine. Reading or listening to music may relax you and help you sleep. Use your bedroom only for sleep. Keep your television and computer out of your bedroom. Keep your bedroom cool, dark, and quiet. Use a supportive mattress and pillows. Follow your provider's instructions for other changes to sleep habits. Nutrition Do not eat big meals in the evening. Do not have caffeine  in the later part of the day. The effects of caffeine  can last for more than 5 hours. Follow your provider's instructions for any changes to what you eat and drink. Lifestyle Do not drink alcohol before bedtime. Alcohol can cause you to fall asleep at first, but then it can cause you to wake up in the middle of the night and have trouble getting back to sleep. Do not smoke, vape, or use nicotine or tobacco. Medicines Take over-the-counter and prescription medicines only as told by your provider. Do not use over-the-counter sleep medicine. You may become dependent on this medicine, and it can make sleep apnea worse. Do not take medicines, such as sedatives and narcotics, unless told to by your provider. Activity Exercise on most days, but avoid exercising in the evening. Exercising near bedtime can interfere with sleeping.  If possible, spend time outside every day. Natural light helps with your internal clock. General information Lose weight if you need to. Stay at a healthy weight. If you are having surgery, make sure to tell your provider that you have sleep apnea. You may need to bring your device with you. Keep all follow-up visits. Your provider will want to check on your condition. Where to find more information National Heart, Lung, and Blood  Institute: buffalodrycleaner.gl This information is not intended to replace advice given to you by your health care provider. Make sure you discuss any questions you have with your health care provider. Document Revised: 05/22/2022 Document Reviewed: 05/22/2022 Elsevier Patient Education  2024 Elsevier Inc.    I would like to thank Jennifer [name], Jennifer Rockford, NP afor allowing me to meet with this patient.    In short, Jennifer [name] is presenting with longstanding OSA on CPAP , but had no new interval study or treatment evaluations in 8.5  years ,  is not longer compliant with CPAP use, ( lass than 50% )  Risk factors for OSA were present,  including : Body mass index is 44.26 kg/m.,  her large neck size and upper airway anatomy. She also has restricted nasal airflow and is a habitual mouth breather.  She is interested to obtain Zepbound  in the setting of OSA and BMI, but insurance last year denied the medication.  She has less overall sleep than she likes and may take power naps at lunch break.      My Plan is to proceed with:  HST-  for current  level of sleep apnea/ OSA and to see if she can go on Zepbound .  Plan B:) SPLIT if HST is denied.    Modafinil/   optional , PRN use for sleepiness when at the job.    OSA risk factor. Has not been seen by Weight treatment clinic.  Sleep hygiene : TV out of the bedroom, avoid screen time, get 7 hours of sleep allocated, if you want to get up at 4 AM you have to go to bed by 8 PM.      I plan to follow up through our NP within 5-6  months.

## 2024-01-21 ENCOUNTER — Other Ambulatory Visit (HOSPITAL_COMMUNITY): Payer: Self-pay

## 2024-01-27 ENCOUNTER — Other Ambulatory Visit (HOSPITAL_COMMUNITY): Payer: Self-pay

## 2024-01-27 MED ORDER — TRAMADOL HCL 50 MG PO TABS
50.0000 mg | ORAL_TABLET | Freq: Four times a day (QID) | ORAL | 0 refills | Status: DC | PRN
  Filled 2024-01-27: qty 20, 5d supply, fill #0

## 2024-01-30 ENCOUNTER — Ambulatory Visit (INDEPENDENT_AMBULATORY_CARE_PROVIDER_SITE_OTHER): Admitting: Neurology

## 2024-01-30 DIAGNOSIS — D509 Iron deficiency anemia, unspecified: Secondary | ICD-10-CM

## 2024-01-30 DIAGNOSIS — E66813 Obesity, class 3: Secondary | ICD-10-CM

## 2024-01-30 DIAGNOSIS — I10 Essential (primary) hypertension: Secondary | ICD-10-CM

## 2024-01-30 DIAGNOSIS — G4733 Obstructive sleep apnea (adult) (pediatric): Secondary | ICD-10-CM | POA: Diagnosis not present

## 2024-01-30 DIAGNOSIS — I4891 Unspecified atrial fibrillation: Secondary | ICD-10-CM

## 2024-02-06 ENCOUNTER — Other Ambulatory Visit (HOSPITAL_COMMUNITY): Payer: Self-pay

## 2024-02-10 ENCOUNTER — Other Ambulatory Visit (HOSPITAL_COMMUNITY): Payer: Self-pay

## 2024-02-18 ENCOUNTER — Ambulatory Visit: Payer: Self-pay

## 2024-02-18 NOTE — Telephone Encounter (Signed)
 Noted. Patient scheduled for an appointment for 02/19/2024 with Rosina Senters, NP.

## 2024-02-18 NOTE — Telephone Encounter (Signed)
 " FYI Only or Action Required?: FYI only for provider: appointment scheduled on 1/8.  Patient was last seen in primary care on 12/26/2023 by Jennifer [name], Jennifer Rockford, NP.  Called Nurse Triage reporting Cough.  Symptoms began several weeks ago.  Interventions attempted: OTC medications: Mucinex  and Rest, hydration, or home remedies.  Symptoms are: stable.  Triage Disposition: See Physician Within 24 Hours  Patient/caregiver understands and will follow disposition?: Yes    Copied from CRM #[id number]. Topic: Clinical - Red Word Triage >> Feb 18, 2024  1:01 PM Jennifer [name] wrote: Red Word that prompted transfer to Nurse Triage: productive cough since 1st of December, greenish brown phlegm, wheezing sometimes    Reason for Disposition  [1] Continuous (nonstop) coughing interferes with work or school AND [2] no improvement using cough treatment per Care Advice  Additional Information  Negative: Chest pain  (Exception: MILD central chest pain, present only when coughing.)    Prior chest pain with cough only has eased up.  Answer Assessment - Initial Assessment Questions 1. ONSET: When did the cough begin?      01/12/24- Bronchial type cough, heals up, then comes back [name] day or two later  Cough heard by Triage RN sounds barky/ harsh.   2. SEVERITY: How bad is the cough today?      Moderate  Took Mucinex - not as bad after taking, 11:30 AM (every 4 hrs dose)  3. SPUTUM: Describe the color of your sputum (e.g., none, dry cough; clear, white, yellow, green)     Dark green phlegm, was thick, getting thinner  4. HEMOPTYSIS: Are you coughing up any blood? If Yes, ask: How much? (e.g., flecks, streaks, tablespoons, etc.)     Denies  5. DIFFICULTY BREATHING: Are you having difficulty breathing? If Yes, ask: How bad is it? (e.g., mild, moderate, severe)      Denies, does have some wheezing [name] little bit, present when laying down flat (physical therapy), but it's not bad  6. FEVER:  Do you have [name] fever? If Yes, ask: What is your temperature, how was it measured, and when did it start?     Denies fever or chills, having night sweats; was able to sleep well last night but prior to that having difficulty due to cough  7. CARDIAC HISTORY: Do you have any history of heart disease? (e.g., heart attack, congestive heart failure)      Atrial fibrillation, hypertension; Denies any heart beat concerns.    8. LUNG HISTORY: Do you have any history of lung disease?  (e.g., pulmonary embolus, asthma, emphysema)     Sleep apnea- hasn't been using mask at night due to cough; denies wearing oxygen. No pulse available.   9. PE RISK FACTORS: Do you have [name] history of blood clots? (or: recent major surgery, recent prolonged travel, bedridden)     Denies  10. OTHER SYMPTOMS: Do you have any other symptoms? (e.g., runny nose, wheezing, chest pain)       12/1- went to Urgent Care, told had bronchitis, 7 days of steroid; didn't really help, might have for [name] few days; itch in throat   11. TRAVEL: Have you traveled out of the country in the last month? (e.g., travel history, exposures)       Grandchildren diagnosed with RSV two weeks ago.  Went through [employer] to get to this line.  Cardiology appt on 1/19.  No pulmonologist, did not call to establish care with pulm, called due to symptoms and okay to see  PCP.  Protocols used: Cough - Acute Productive-[name]-AH  "

## 2024-02-19 ENCOUNTER — Other Ambulatory Visit (HOSPITAL_COMMUNITY): Payer: Self-pay

## 2024-02-19 ENCOUNTER — Ambulatory Visit (INDEPENDENT_AMBULATORY_CARE_PROVIDER_SITE_OTHER): Admitting: Internal Medicine

## 2024-02-19 ENCOUNTER — Encounter: Payer: Self-pay | Admitting: Internal Medicine

## 2024-02-19 VITALS — BP 122/80 | HR 85 | Temp 98.5°F | Ht 62.0 in | Wt 234.4 lb

## 2024-02-19 DIAGNOSIS — Z20828 Contact with and (suspected) exposure to other viral communicable diseases: Secondary | ICD-10-CM

## 2024-02-19 DIAGNOSIS — J40 Bronchitis, not specified as acute or chronic: Secondary | ICD-10-CM

## 2024-02-19 MED ORDER — PREDNISONE 20 MG PO TABS
40.0000 mg | ORAL_TABLET | Freq: Every day | ORAL | 0 refills | Status: AC
  Filled 2024-02-19: qty 10, 5d supply, fill #0

## 2024-02-19 MED ORDER — ALBUTEROL SULFATE HFA 108 (90 BASE) MCG/ACT IN AERS
2.0000 | INHALATION_SPRAY | Freq: Four times a day (QID) | RESPIRATORY_TRACT | 2 refills | Status: AC | PRN
  Filled 2024-02-19: qty 6.7, 25d supply, fill #0

## 2024-02-19 MED ORDER — AZITHROMYCIN 250 MG PO TABS
ORAL_TABLET | ORAL | 0 refills | Status: AC
  Filled 2024-02-19: qty 6, 5d supply, fill #0

## 2024-02-19 MED ORDER — BENZONATATE 100 MG PO CAPS
100.0000 mg | ORAL_CAPSULE | Freq: Three times a day (TID) | ORAL | 0 refills | Status: DC | PRN
  Filled 2024-02-19: qty 30, 10d supply, fill #0

## 2024-02-19 NOTE — Progress Notes (Signed)
 " [facility] [address] [place] [address] Dept: [phone] Dept Fax: [phone]  Acute Care Office Visit  Subjective:   Jennifer [name] [birth year] 02/19/2024  Chief Complaint  Patient presents with   Cough    Headache, sore throat, mucus green yellow    HPI:  Discussed the use of AI scribe software for clinical note transcription with the patient, who gave verbal consent to proceed.  History of Present Illness   Jennifer [name] is a 64 year old female who presents with a persistent cough.  She has been experiencing a persistent cough since early December, following an episode of bronchitis. The cough initially improved slightly before her shoulder surgery but worsened again shortly thereafter after being exposed to her grand kids, who tested positive for RSV. It is characterized by daily production of mucus, which was initially green and has since turned yellow. The cough is worse at night.  She denies any consistent fever but has experienced night sweats on a few occasions. She reports nasal congestion and a sore throat that has since resolved. She experiences occasional wheezing, particularly when lying flat during physical therapy sessions, and notes mild difficulty breathing but no chest pain.  Her current medications include Robitussin, Mucinex , with the latter providing some relief. She recently ran out of Mucinex .  The following portions of the patient's history were reviewed and updated as appropriate: past medical history, past surgical history, family history, social history, allergies, medications, and problem list.   Patient Active Problem List   Diagnosis Date Noted   Hypertension 01/19/2024   OSA on CPAP 01/19/2024   Chronic pain of both shoulders 11/07/2023   Macular puckering, bilateral 01/16/2023   Left knee pain 01/18/2019   Elevated LDL cholesterol level 04/21/2018   OSA (obstructive sleep  apnea) 03/13/2016   Hyperglycemia 01/12/2016   Atrial fibrillation (HCC) 11/16/2015   Esophageal reflux 10/16/2012   Periodic headache syndrome 08/21/2012   Class 3 severe obesity due to excess calories with serious comorbidity and body mass index (BMI) of 45.0 to 49.9 in adult (HCC) 12/20/2011   HOT FLASHES 04/27/2010   CHOLECYSTECTOMY, LAPAROSCOPIC, HX OF 08/09/2009   Allergic rhinitis 04/26/2008   Anemia 05/05/2007   CONDYLOMA ACUMINATUM 04/15/2007   Resistant hypertension 07/03/2006   Past Medical History:  Diagnosis Date   Allergy    allergic rhinitis   Anemia 06/2007   C-scope 06/2007: tics, neg hemocults 07/2007   Anxiety    Blood transfusion without reported diagnosis    at birth   Chest pain 12/2007   negative ECHO, neg stress test 06/2008   Condyloma acuminatum    history of   Hyperglycemia    borderline. A1c 5.7  12/2007   Hypertension    Personal history of atrial fibrillation    on carvedilol  only    Sleep apnea    wears cpap at night   Past Surgical History:  Procedure Laterality Date   ABDOMINAL HYSTERECTOMY     no oophorectomy   BREAST EXCISIONAL BIOPSY     BREAST SURGERY  age 28   Fibroid tumor removed right breast   CARPAL TUNNEL RELEASE     CESAREAN SECTION     CHOLECYSTECTOMY     COLONOSCOPY     Family History  Problem Relation Age of Onset   Breast cancer Paternal Aunt    Cervical cancer Cousin    Diabetes Mother    Coronary artery disease Father  Kidney disease Father    Diabetes Father    Hypertension Father    Prostate cancer Father    Coronary artery disease Sister    Kidney disease Sister    Diabetes Sister    Hypertension Sister    Kidney disease Brother    Stroke Brother    Hypertension Brother        x3   Diabetes Maternal Grandmother    Stroke Maternal Grandmother    Diabetes Paternal Grandmother    Stroke Paternal Grandmother    Coronary artery disease Other        grandmother, uncle   Kidney disease Other         grandmother   Stroke Other        uncle   Thyroid  disease Other        aunts   Glaucoma Other        grandmother   Hypertension Other        grandmother   Diabetes Other        aunts   Hypothyroidism Other        aunt   Hyperthyroidism Other        aunt   Healthy Child    Healthy Child    Healthy Child    Colon cancer Neg Hx    Colon polyps Neg Hx    Esophageal cancer Neg Hx    Rectal cancer Neg Hx    Stomach cancer Neg Hx    Current Medications[1] Allergies[2]   ROS: A complete ROS was performed with pertinent positives/negatives noted in the HPI. The remainder of the ROS are negative.    Objective:   Today's Vitals   02/19/24 1316  BP: 122/80  Pulse: 85  Temp: 98.5 F (36.9 C)  TempSrc: Oral  SpO2: 96%  Weight: 234 lb 6.4 oz (106.3 kg)  Height: 5' 2 (1.575 m)    GENERAL: Well-appearing, in NAD. Well nourished.  SKIN: Pink, warm and dry. No rash.  HEENT:    HEAD: Normocephalic, non-traumatic.  EYES: Conjunctive pink without exudate.  EARS: External ear w/o redness, swelling, masses, or lesions. NOSE: Septum midline w/o deformity. Nares patent, mucosa pink and non-inflamed w/o drainage. No sinus tenderness.  THROAT: Uvula midline. Oropharynx clear. Tonsils non-inflamed w/o exudate . Mucus membranes pink and moist.  NECK: Trachea midline. Full ROM w/o pain or tenderness. No lymphadenopathy.  RESPIRATORY: Chest wall symmetrical. Respirations even and non-labored. Breath sounds clear to auscultation bilaterally. (+) dry, barky bronchiole cough CARDIAC: S1, S2 present, regular rate and rhythm. Peripheral pulses 2+ bilaterally.  EXTREMITIES: Without clubbing, cyanosis, or edema.  NEUROLOGIC: Steady, even gait.  PSYCH/MENTAL STATUS: Alert, oriented x 3. Cooperative, appropriate mood and affect.    No results found for any visits on 02/19/24.    Assessment & Plan:  Assessment and Plan    Acute bronchitis with RSV exposure - Prescribed albuterol  inhaler 1-2  puffs every 4-6 hours as needed. - Prescribed prednisone  40mg  daily x 5 days  - Prescribed azithromycin  (Z-Pak)  - Recommended over-the-counter Mucinex  DM for cough suppression. - Prescribed cough pearls for additional cough relief. - Advised hydration and nutritional intake.   Meds ordered this encounter  Medications   predniSONE  (DELTASONE ) 20 MG tablet    Sig: Take 2 tablets (40 mg total) by mouth daily with breakfast for 5 days.    Dispense:  10 tablet    Refill:  0    Supervising Provider:   THOMPSON, AARON B [[id number]]  benzonatate  (TESSALON ) 100 MG capsule    Sig: Take 1 capsule (100 mg total) by mouth 3 (three) times daily as needed for cough.    Dispense:  30 capsule    Refill:  0    Supervising Provider:   THOMPSON, AARON B [[id number]]   azithromycin  (ZITHROMAX ) 250 MG tablet    Sig: Take 2 tablets on day 1, then 1 tablet daily on days 2 through 5    Dispense:  6 tablet    Refill:  0    Supervising Provider:   THOMPSON, AARON B [[id number]]   albuterol  (VENTOLIN  HFA) 108 (90 Base) MCG/ACT inhaler    Sig: Inhale 2 puffs into the lungs every 6 (six) hours as needed for wheezing or shortness of breath.    Dispense:  8 g    Refill:  2    Supervising Provider:   THOMPSON, AARON B [[id number]]   No orders of the defined types were placed in this encounter.  Lab Orders  No laboratory test(s) ordered today   No images are attached to the encounter or orders placed in the encounter.  Return if symptoms worsen or fail to improve.   Rosina Senters, FNP     [1]  Current Outpatient Medications:    albuterol  (VENTOLIN  HFA) 108 (90 Base) MCG/ACT inhaler, Inhale 2 puffs into the lungs every 6 (six) hours as needed for wheezing or shortness of breath., Disp: 8 g, Rfl: 2   azithromycin  (ZITHROMAX ) 250 MG tablet, Take 2 tablets on day 1, then 1 tablet daily on days 2 through 5, Disp: 6 tablet, Rfl: 0   benzonatate  (TESSALON ) 100 MG capsule, Take 1 capsule (100 mg total) by mouth 3  (three) times daily as needed for cough., Disp: 30 capsule, Rfl: 0   carvedilol  (COREG ) 25 MG tablet, Take 1 tablet (25 mg total) by mouth 2 (two) times daily with a meal., Disp: 180 tablet, Rfl: 3   MAGNESIUM PO, Take by mouth. , Disp: , Rfl:    Multiple Vitamin (MULTIVITAMIN WITH MINERALS) TABS tablet, Take 1 tablet by mouth at bedtime. ALIVE multi-vitamin, Disp: , Rfl:    olmesartan  (BENICAR ) 40 MG tablet, Take 1 tablet (40 mg total) by mouth daily., Disp: 90 tablet, Rfl: 3   predniSONE  (DELTASONE ) 20 MG tablet, Take 2 tablets (40 mg total) by mouth daily with breakfast for 5 days., Disp: 10 tablet, Rfl: 0   triamterene -hydrochlorothiazide  (MAXZIDE -25) 37.5-25 MG tablet, Take 1 tablet by mouth daily., Disp: 90 tablet, Rfl: 3   hydrALAZINE  (APRESOLINE ) 25 MG tablet, Take 1 tablet (25 mg total) by mouth 2 (two) times daily. (Patient not taking: Reported on 01/19/2024), Disp: 60 tablet, Rfl: 5   methocarbamol  (ROBAXIN ) 500 MG tablet, Take 1 tablet (500 mg total) by mouth every 8 (eight) hours as needed for muscle spasms. (Patient not taking: Reported on 02/19/2024), Disp: 30 tablet, Rfl: 0   ondansetron  (ZOFRAN -ODT) 4 MG disintegrating tablet, Dissolve 1 tablet (4 mg total) by mouth every 8 (eight) hours as needed for nausea and vomiting. (Patient not taking: Reported on 02/19/2024), Disp: 10 tablet, Rfl: 0   senna (SENOKOT) 8.6 MG tablet, Take 2 tablets by mouth every day while taking pain medicaiton, for postoperative constipation. (Patient not taking: Reported on 02/19/2024), Disp: 30 tablet, Rfl: 0   traMADol  (ULTRAM ) 50 MG tablet, Take 1 tablet (50 mg total) by mouth every 6 (six) hours as needed for severe pain (Patient not taking: Reported on 02/19/2024), Disp: 20 tablet, Rfl: 0 [  2]  Allergies Allergen Reactions   Ace Inhibitors Cough   Codeine Nausea And Vomiting   "

## 2024-03-01 ENCOUNTER — Ambulatory Visit

## 2024-03-01 ENCOUNTER — Encounter (HOSPITAL_BASED_OUTPATIENT_CLINIC_OR_DEPARTMENT_OTHER): Payer: Self-pay | Admitting: Family

## 2024-03-01 ENCOUNTER — Ambulatory Visit (INDEPENDENT_AMBULATORY_CARE_PROVIDER_SITE_OTHER): Admitting: Family

## 2024-03-01 ENCOUNTER — Other Ambulatory Visit (HOSPITAL_COMMUNITY): Payer: Self-pay

## 2024-03-01 VITALS — BP 108/64 | HR 70 | Ht 62.0 in | Wt 238.0 lb

## 2024-03-01 DIAGNOSIS — I480 Paroxysmal atrial fibrillation: Secondary | ICD-10-CM

## 2024-03-01 DIAGNOSIS — I10 Essential (primary) hypertension: Secondary | ICD-10-CM

## 2024-03-01 MED ORDER — CARVEDILOL 12.5 MG PO TABS
12.5000 mg | ORAL_TABLET | Freq: Two times a day (BID) | ORAL | 1 refills | Status: AC
  Filled 2024-03-01: qty 60, 30d supply, fill #0

## 2024-03-01 NOTE — Patient Instructions (Addendum)
 Medication Instructions:  CHANGE Carvedilol  to HALF tablet twice per day    Testing/Procedures: Your physician has recommended that you wear a Zio monitor.   This monitor is a medical device that records the hearts electrical activity. Doctors most often use these monitors to diagnose arrhythmias. Arrhythmias are problems with the speed or rhythm of the heartbeat. The monitor is a small device applied to your chest. You can wear one while you do your normal daily activities. While wearing this monitor if you have any symptoms to push the button and record what you felt. Once you have worn this monitor for the period of time provider prescribed (Usually 14 days), you will return the monitor device in the postage paid box. Once it is returned they will download the data collected and provide us  with a report which the provider will then review and we will call you with those results. Important tips:  Avoid showering during the first 24 hours of wearing the monitor. Avoid excessive sweating to help maximize wear time. Do not submerge the device, no hot tubs, and no swimming pools. Keep any lotions or oils away from the patch. After 24 hours you may shower with the patch on. Take brief showers with your back facing the shower head.  Do not remove patch once it has been placed because that will interrupt data and decrease adhesive wear time. Push the button when you have any symptoms and write down what you were feeling. Once you have completed wearing your monitor, remove and place into box which has postage paid and place in your outgoing mailbox.  If for some reason you have misplaced your box then call our office and we can provide another box and/or mail it off for you.   Follow-Up: Please follow up in 2-3 months in [facility] with Dr. Raford, Reche Finder, NP or Allean Mink PharmD

## 2024-03-01 NOTE — Progress Notes (Unsigned)
 Enrolled a 14 day Zio XT monitor to be mailed to patients home   Dawson to read

## 2024-03-01 NOTE — Progress Notes (Signed)
 "  [facility] Initial Assessment:    Date:  03/01/2024   ID:  Jennifer [name], DOB May 30, 1960, MRN [id number]  PCP:  Katheen Roselie Rockford, NP  Cardiologist:  None  Nephrologist:  Referring MD: Katheen Roselie Rockford, NP   CC: Hypertension  History of Present Illness:    Jennifer [name] is a 64 y.o. female with a hx of HTN, HLD, OSA on CPAP, PAF here to establish care in the [facility]. Strong family history of high blood pressure.   Previously seen by EP in 2018 after atrial fibrillation at PCP.  Atrial fibrillation symptomatic with palpitations.  She was on flecainide  and Xarelto .  CHA2DS2-VASc of 2.  She is no longer on flecainide  nor Xarelto .  Unclear when or why these were discontinued.  Prior CT 2021 normal adrenals. 12/08/2023 normal TSH 1.83, normal plasma metanephrines, normal morning cortisol, labs not consistent with hyperaldosteronism. Renal artery duplex 01/06/2024 with no renal artery stenosis.  Neurology has ordered sleep study. Hopeful to qualify for Zepbound . Her last sleep study and CPAP were from 2017 (AHI 26.5/hr).  Jennifer [name] was diagnosed with hypertension right after deliverying her twin 27 years ago, had preeclampsia. It has been difficult to control. Blood pressure checked with arm cuff at home. Readings have been 110-130/70-80s. Most often checks her blood pressure in the morning before waking up. She does feel a bit lightheaded with lower readings. she reports tobacco use previously having quit in 2006. Alcohol use never. For exercise she has no formal routine, recently had shoulder surgery. She is participating in PT. she eats outside of the home and does not follow low sodium diet with eating out though does not add salt to her food. She is wearing CPAP regularly.   She does not take any over the counter agents that would impact BP. Rare use of Aleve .   She has not had palpitations recently . She stopped her Xarelto   years ago, no noted side effects.   CHA2DS2-VASc Score = 3   This indicates a 3.2% annual risk of stroke. The patient's score is based upon: CHF History: 0 HTN History: 1 Diabetes History: 0 Stroke History: 0 Vascular Disease History: 0 Age Score: 1 Gender Score: 1      Previous antihypertensives: Lisinopril  - cough Amlodipine  - myalgia  Past Medical History:  Diagnosis Date   Allergy    allergic rhinitis   Anemia 06/2007   C-scope 06/2007: tics, neg hemocults 07/2007   Anxiety    Blood transfusion without reported diagnosis    at birth   Chest pain 12/2007   negative ECHO, neg stress test 06/2008   Condyloma acuminatum    history of   Hyperglycemia    borderline. A1c 5.7  12/2007   Hypertension    Personal history of atrial fibrillation    on carvedilol  only    Sleep apnea    wears cpap at night    Past Surgical History:  Procedure Laterality Date   ABDOMINAL HYSTERECTOMY     no oophorectomy   BREAST EXCISIONAL BIOPSY     BREAST SURGERY  age 22   Fibroid tumor removed right breast   CARPAL TUNNEL RELEASE     CESAREAN SECTION     CHOLECYSTECTOMY     COLONOSCOPY      Current Medications: Active Medications[1]   Allergies:   Ace inhibitors and Codeine   Social History   Socioeconomic History   Marital status: Divorced  Spouse name: Not on file   Number of children: 3   Years of education: Not on file   Highest education level: Not on file  Occupational History    Employer: [employer]  Tobacco Use   Smoking status: Former    Current packs/day: 0.00    Average packs/day: 0.5 packs/day for 27.0 years (13.5 ttl pk-yrs)    Types: Cigarettes    Start date: 02/11/1977    Quit date: 02/12/2004    Years since quitting: 20.0   Smokeless tobacco: Never  Substance and Sexual Activity   Alcohol use: No   Drug use: No   Sexual activity: Not Currently    Birth control/protection: Post-menopausal, Surgical    Comment: s/p hysterectomy  Other Topics  Concern   Not on file  Social History Narrative   Regular exercise  No   Caffeine -- 1 daily   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   Divorced 2013   3 children (all with her)   Enjoys church   1 dog (1/2 poodle, 1/2 shitzu)   Social Drivers of Health   Tobacco Use: Medium Risk (03/01/2024)   Patient History    Smoking Tobacco Use: Former    Smokeless Tobacco Use: Never    Passive Exposure: Not on file  Financial Resource Strain: Patient Unable To Answer (03/01/2024)   Overall Financial Resource Strain (CARDIA)    Difficulty of Paying Living Expenses: Patient unable to answer  Food Insecurity: Unknown (03/01/2024)   Epic    Worried About Programme Researcher, Broadcasting/film/video in the Last Year: Never true    [employer] of Food in the Last Year: Not on file  Transportation Needs: Unknown (03/01/2024)   Epic    Lack of Transportation (Medical): No    Lack of Transportation (Non-Medical): Not on file  Physical Activity: Insufficiently Active (03/01/2024)   Exercise Vital Sign    Days of Exercise per Week: 1 day    Minutes of Exercise per Session: 30 min  Stress: No Stress Concern Present (03/01/2024)   [employer] of Occupational Health - Occupational Stress Questionnaire    Feeling of Stress: Not at all  Social Connections: Not on file  Depression (PHQ2-9): Low Risk (03/01/2024)   Depression (PHQ2-9)    PHQ-2 Score: 0  Alcohol Screen: Not on file  Housing: Unknown (03/01/2024)   Epic    Unable to Pay for Housing in the Last Year: No    Number of Times Moved in the Last Year: Not on file    Homeless in the Last Year: Not on file  Utilities: Not At Risk (03/01/2024)   Epic    Threatened with loss of utilities: No  Health Literacy: Not on file     Family History: The patient's family history includes Breast cancer in her paternal aunt; Cervical cancer in her cousin; Coronary artery disease in her father, sister, and another family member; Diabetes  in her father, maternal grandmother, mother, paternal grandmother, sister, and another family member; Glaucoma in an other family member; Healthy in her child, child, and child; Hypertension in her brother, father, sister, and another family member; Hyperthyroidism in an other family member; Hypothyroidism in an other family member; Kidney disease in her brother, father, sister, and another family member; Prostate cancer in her father; Stroke in her brother, maternal grandmother, paternal grandmother, and another family member; Thyroid  disease in an other family member. There is no history of Colon cancer, Colon polyps,  Esophageal cancer, Rectal cancer, or Stomach cancer.  ROS:   Please see the history of present illness.     All other systems reviewed and are negative.  EKGs/Labs/Other Studies Reviewed:    EKG Interpretation Date/Time:  Monday March 01 2024 09:15:47 EST Ventricular Rate:  70 PR Interval:  184 QRS Duration:  74 QT Interval:  368 QTC Calculation: 397 R Axis:   26  Text Interpretation: Normal sinus rhythm Low voltage QRS Confirmed by Vannie Mora ([id number]) on 03/01/2024 12:41:29 PM    Recent Labs: 11/14/2023: ALT 14 12/08/2023: BUN 13; Creatinine, Ser 0.71; Potassium 4.1; Sodium 137; TSH 1.83   Recent Lipid Panel    Component Value Date/Time   CHOL 175 11/14/2023 1024   TRIG 62.0 11/14/2023 1024   HDL 49.50 11/14/2023 1024   CHOLHDL 4 11/14/2023 1024   VLDL 12.4 11/14/2023 1024   LDLCALC 113 (H) 11/14/2023 1024    Physical Exam:   VS:  BP 108/64 Comment: RIGHT ARM  Pulse 70   Ht 5' 2 (1.575 m)   Wt 238 lb (108 kg)   SpO2 95% Comment: RA  BMI 43.53 kg/m  , BMI Body mass index is 43.53 kg/m. GENERAL:  Well appearing HEENT: Pupils equal round and reactive, fundi not visualized, oral mucosa unremarkable NECK:  No jugular venous distention, waveform within normal limits, carotid upstroke brisk and symmetric, no bruits, no thyromegaly LYMPHATICS:  No cervical  adenopathy LUNGS:  Clear to auscultation bilaterally HEART:  RRR.  PMI not displaced or sustained,S1 and S2 within normal limits, no S3, no S4, no clicks, no rubs, no murmurs ABD:  Flat, positive bowel sounds normal in frequency in pitch, no bruits, no rebound, no guarding, no midline pulsatile mass, no hepatomegaly, no splenomegaly EXT:  2 plus pulses throughout, no edema, no cyanosis no clubbing SKIN:  No rashes no nodules NEURO:  Cranial nerves II through XII grossly intact, motor grossly intact throughout PSYCH:  Cognitively intact, oriented to person place and time   ASSESSMENT/PLAN:    HTN - BP now at goal with symptomatic hypotension.  Reduce carvedilol  from 25mg  to 12.5mg  BID. MyChart message in 2 weeks to check on on BP. Remove hydralazine  from medication list as she never took this.  Continue olmesartan  40 mg daily, triamterene /HCTZ 7.5-25 mg daily. Discussed to monitor BP at home at least 2 hours after medications and sitting for 5-10 minutes.   PAF - Not on OAC or Flecainide  since 2018. Unclear why discontinued, appears she may have self-discontinued. Reports rare palpitations. Continue Coreg  12.5mg  BID. Plan for 14 day ZIO to assess for atrial fib given last documented atrial fibrillation 11/16/15. EKG today NSR. CHA2DS2-VASc Score = 3 [CHF History: 0, HTN History: 1, Diabetes History: 0, Stroke History: 0, Vascular Disease History: 0, Age Score: 1, Gender Score: 1].  Therefore, the patient's annual risk of stroke is 3.2 %.    If Afib noted, will need to resume OAC.  Screening for Secondary Hypertension:     Relevant Labs/Studies:    Latest Ref Rng & Units 12/08/2023    9:57 AM 11/14/2023   10:24 AM 04/15/2023    9:20 AM  Basic Labs  Sodium 135 - 145 mEq/L 137  141  139   Potassium 3.5 - 5.1 mEq/L 4.1  4.0  3.8   Creatinine 0.40 - 1.20 mg/dL 9.28  9.21  9.30        Latest Ref Rng & Units 12/08/2023    9:57 AM 10/09/2020  8:50 AM  Thyroid    TSH 0.35 - 5.50 uIU/mL 1.83   1.73        Latest Ref Rng & Units 12/08/2023    9:57 AM  Renin/Aldosterone   Aldosterone  ng/dL 8        Latest Ref Rng & Units 12/08/2023    9:57 AM  Metanephrines/Catecholamines   Metanephrines <=57 pg/mL 38   Normetanephrines  <=148 pg/mL 109        Latest Ref Rng & Units 12/08/2023    9:57 AM  Cortisol  Cortisol  ug/dL 5.5        88/74/7974    8:29 AM  Renovascular   Renal Artery US  Completed Yes      Disposition:    FU with MD/APP/PharmD in 2-3 months    Medication Adjustments/Labs and Tests Ordered: Current medicines are reviewed at length with the patient today.  Concerns regarding medicines are outlined above.  Orders Placed This Encounter  Procedures   LONG TERM MONITOR (3-14 DAYS)   EKG 12-Lead   Meds ordered this encounter  Medications   carvedilol  (COREG ) 12.5 MG tablet    Sig: Take 1 tablet (12.5 mg total) by mouth 2 (two) times daily.    Dispense:  180 tablet    Refill:  1    Dose decrease     Signed, Maxson Oddo S Latika Kronick, NP  03/01/2024 12:47 PM    [facility]     [1]  Current Meds  Medication Sig   albuterol  (VENTOLIN  HFA) 108 (90 Base) MCG/ACT inhaler Inhale 2 puffs into the lungs every 6 (six) hours as needed for wheezing or shortness of breath.   benzonatate  (TESSALON ) 100 MG capsule Take 1 capsule (100 mg total) by mouth 3 (three) times daily as needed for cough.   carvedilol  (COREG ) 12.5 MG tablet Take 1 tablet (12.5 mg total) by mouth 2 (two) times daily.   MAGNESIUM PO Take by mouth.    methocarbamol  (ROBAXIN ) 500 MG tablet Take 1 tablet (500 mg total) by mouth every 8 (eight) hours as needed for muscle spasms.   Multiple Vitamin (MULTIVITAMIN WITH MINERALS) TABS tablet Take 1 tablet by mouth at bedtime. ALIVE multi-vitamin   olmesartan  (BENICAR ) 40 MG tablet Take 1 tablet (40 mg total) by mouth daily.   traMADol  (ULTRAM ) 50 MG tablet Take 1 tablet (50 mg total) by mouth every 6 (six) hours as needed for  severe pain   triamterene -hydrochlorothiazide  (MAXZIDE -25) 37.5-25 MG tablet Take 1 tablet by mouth daily.   [DISCONTINUED] carvedilol  (COREG ) 25 MG tablet Take 1 tablet (25 mg total) by mouth 2 (two) times daily with a meal.   "

## 2024-03-03 NOTE — Progress Notes (Signed)
 "    [facility]  Jennifer [name] 64 year old female December 08, 1960   HOME SLEEP TEST REPORT ( by Elene  mail -out device )    Study Protocol:     The SANSA chest-worn sensor is an FDA cleared and DOT approved type 4 home sleep test device - it measures eight physiological channels,  including blood oxygen saturation (measured via PPG [photoplethysmography]), EKG-derived heart rate, respiratory effort, chest movement (measured via accelerometer), snoring, body position, and actigraphy.    STUDY DATE:  02-16-2024  Data received :  03-03-2024   ORDERING CLINICIAN:  Dedra Gores, MD  REFERRING CLINICIAN:  Katheen Garden, MD    CLINICAL INFORMATION/HISTORY: patient with paroxysmal atrial fib, obesity class 3( BMI 44-45) , chest pain and  Anemia, already on CPAP ( see below , tested by dr Burnard, cardiology. Jennifer [name] , on 01/19/24 , who is a 64 y.o.  female patient,  seen upon a referral by Garden Bishop Katheen, NP,    for a new  Sleep Medicine Consultation.  The patient's referral information asked for a retesting  due to  uncontrolled HTN , atrial fibrillation and Obesity : while using CPAP for OSA diagnosed in 2017 by Dr Burnard.    Chief concern according to patient:  I fall asleep in daytime , while at work - but that's mostly after lunch.  I have blood pressure issues. I am not anticoagulated, but have atrial fib  and : . I am using CPAP ( the same device )  since diagnosed in 2017  Hypersomnia has been present. 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 Chest pain (12/2007), Condyloma acuminatum, Hyperglycemia, Hypertension, Personal history of atrial fibrillation, and OSA-Sleep apnea in the setting of Obesity , BMI 44.3 :.    ENT  problems: (Sinusitis seasonal ), sleep walking in childhood, No head Trauma hx,  no Syncope,  positive for remote Anemia, remote hx of GERD,  denies Thyroid  disease or DM or other endocrinological disorders,  . This patient had a previous sleep study/ studies in the year 2017 under Dr Burnard at [facility] from Dr Burnard 01-2016 last ( and only) sleep study was performed by [facility] protocol : Moderate obstructive sleep apnea was dx during the diagnostic portion of the study (AHI = 26.5/hour); however, events were severe during REM Sleep (AHI 78.4/h). nadir 02 69%.   CPAP was initiated at 5 cm and was titrated to 13 cm water pressure. An optimal PAP pressure was found at  13 cm of water with an  AHI of 0/h.    Epworth sleepiness score: 4 / 24 points.    FSS endorsed at 21/ 63 points.  GDS: 1/ 15    BMI: 44.3 kg/m  Neck Circumference:  18     Sleep Summary:   Total Recording Time (hours, min):  9 h 17 minutes       Total Sleep Time (hours, min):  6 h 6 minutes   Sleep efficiency %; 68%  REM sleep was present for 48 minutes,                                  Respiratory Indices (  AHI ) by CMS criteria of scoring: Calculated pAHI (per hour):  11.1/h    REM AHI was 56/h                                                Positional  respiratory activity  : AHI supine and lateral was very similar , around 10-12/h.  Volume of  snoring : loud snoring in supine position.   Oxygen Saturation during Sleep:   Oxygen Saturation (%) Mean at 92 % with an  O2 Saturation Range (%) between nadir  at 66.7 % and 99.8 %                                      O2 Saturation time (minutes) <89%: 23 minutes           Pulse Rate during Sleep :   Pulse Mean (bpm)  79 bpm in irregular  rhythm,  Pulse Range: between 62  bpm and 96  bpm.,                 IMPRESSION:  This HST confirms the presence of  mild and all obstructive sleep apnea with REM sleep dependent form. There was clinically significant hypoxia present,  total hypoxia time 23 minutes and a nadir in the 60's.  No positional component influencing apnea severity was noted.    RECOMMENDATION: CPAP is the best option for REM sleep dependent OSA ( sleep apnea)  and hypoxia, and I will renew the CPAP prescription for this patient.  She has a choice of interface as well.    Any Patient endorsing a high level of sleepiness should be cautioned not to drive, work at heights, or operate dangerous machinery or heavy equipment when tired or sleepy.  Review of good sleep hygiene measures took place in the initial consultation but should be revisited ( Your guide to better sleep  a publication by the NIH is a good source of information).   The referring provider will be notified of the test results.    I certify that I have reviewed the raw data recording prior to the issuance of this report in accordance with the standards of the American Academy of Sleep Medicine (AASM).    INTERPRETING PHYSICIAN:  Dedra Gores, MD  [facility] and [facility] Sleep Board certified by [employer] of Sleep Medicine and Diplomate of the [employer] of Sleep Medicine. Board certified In Neurology through the ABPN, Fellow of the [employer].        "

## 2024-03-09 ENCOUNTER — Ambulatory Visit: Payer: Self-pay | Admitting: Neurology

## 2024-03-09 NOTE — Procedures (Signed)
 [facility]  Jennifer [name] 64 year old female 02-06-1961   HOME SLEEP TEST REPORT ( by Elene  mail -out device )    Study Protocol:     The SANSA chest-worn sensor is an FDA cleared and DOT approved type 4 home sleep test device - it measures eight physiological channels,  including blood oxygen saturation (measured via PPG [photoplethysmography]), EKG-derived heart rate, respiratory effort, chest movement (measured via accelerometer), snoring, body position, and actigraphy.    STUDY DATE:  02-16-2024  Data received :  03-03-2024   ORDERING CLINICIAN:  Dedra Gores, MD  REFERRING CLINICIAN:  Katheen Garden, MD    CLINICAL INFORMATION/HISTORY: patient with paroxysmal atrial fib, obesity class 3( BMI 44-45) , chest pain and  Anemia, already on CPAP ( see below , tested by dr Burnard, cardiology. Jennifer [name] , on 01/19/24 , who is a 64 y.o.  female patient,  seen upon a referral by Garden Bishop Katheen, NP,    for a new  Sleep Medicine Consultation.  The patient's referral information asked for a retesting  due to  uncontrolled HTN , atrial fibrillation and Obesity : while using CPAP for OSA diagnosed in 2017 by Dr Burnard.    Chief concern according to patient:  I fall asleep in daytime , while at work - but that's mostly after lunch.  I have blood pressure issues. I am not anticoagulated, but have atrial fib  and : . I am using CPAP ( the same device )  since diagnosed in 2017  Hypersomnia has been present. 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 Chest pain (12/2007), Condyloma acuminatum, Hyperglycemia, Hypertension, Personal history of atrial fibrillation, and OSA-Sleep apnea in the setting of Obesity , BMI 44.3 :.    ENT  problems: (Sinusitis seasonal ), sleep walking in childhood, No head Trauma hx,  no Syncope,  positive for remote Anemia, remote hx of GERD,  denies Thyroid  disease or DM or other endocrinological disorders,  . This patient had a previous sleep study/ studies in the year 2017 under Dr Burnard at [facility] from Dr Burnard 01-2016 last ( and only) sleep study was performed by [facility] protocol : Moderate obstructive sleep apnea was dx during the diagnostic portion of the study (AHI = 26.5/hour); however, events were severe during REM Sleep (AHI 78.4/h). nadir 02 69%.   CPAP was initiated at 5 cm and was titrated to 13 cm water pressure. An optimal PAP pressure was found at  13 cm of water with an  AHI of 0/h.    Epworth sleepiness score: 4 / 24 points.    FSS endorsed at 21/ 63 points.  GDS: 1/ 15    BMI: 44.3 kg/m  Neck Circumference:  18     Sleep Summary:   Total Recording Time (hours, min):  9 h 17 minutes       Total Sleep Time (hours, min):  6 h 6 minutes   Sleep efficiency %; 68%  REM sleep was present for 48 minutes,                                  Respiratory Indices ( AHI ) by CMS  criteria of scoring: Calculated pAHI (per hour):  11.1/h    REM AHI was 56/h                                                Positional  respiratory activity  : AHI supine and lateral was very similar , around 10-12/h.  Volume of  snoring : loud snoring in supine position.   Oxygen Saturation during Sleep:   Oxygen Saturation (%) Mean at 92 % with an  O2 Saturation Range (%) between nadir  at 66.7 % and 99.8 %                                      O2 Saturation time (minutes) <89%: 23 minutes           Pulse Rate during Sleep :   Pulse Mean (bpm)  79 bpm in regular  rhythm,  Pulse Range: between 62  bpm and 96  bpm.,                 IMPRESSION:  This HST confirms the presence of  mild and all obstructive sleep apnea with REM sleep dependent form. There was clinically significant hypoxia present,  total hypoxia time 23 minutes and a nadir in the 60's.  No positional component influencing apnea severity was noted.    RECOMMENDATION: CPAP is the best option for REM sleep dependent OSA ( sleep apnea)  and hypoxia, and I will renew the CPAP prescription for this patient.  She has a choice of interface as well.    Any Patient endorsing a high level of sleepiness should be cautioned not to drive, work at heights, or operate dangerous machinery or heavy equipment when tired or sleepy.  Review of good sleep hygiene measures took place in the initial consultation but should be revisited ( Your guide to better sleep  a publication by the NIH is a good source of information).   The referring provider will be notified of the test results.    I certify that I have reviewed the raw data recording prior to the issuance of this report in accordance with the standards of the American Academy of Sleep Medicine (AASM).    INTERPRETING PHYSICIAN:  Dedra Gores, MD  [facility] and [facility] Sleep Board certified by [employer] of Sleep Medicine and Diplomate of the [employer] Sleep Medicine. Board certified In Neurology through the ABPN, Fellow of the [employer].

## 2024-03-09 NOTE — Progress Notes (Signed)
 Please arrange for follow up visit on new CPAP., Np or me 30-90 days on new machine,.

## 2024-03-10 ENCOUNTER — Other Ambulatory Visit (HOSPITAL_COMMUNITY): Payer: Self-pay

## 2024-03-15 ENCOUNTER — Telehealth: Payer: Self-pay

## 2024-03-15 ENCOUNTER — Ambulatory Visit: Admitting: Nurse Practitioner

## 2024-03-15 ENCOUNTER — Ambulatory Visit

## 2024-03-15 NOTE — Telephone Encounter (Signed)
 Haydn Cush D, CMA  Joylene, Bradley; Ziegler, Melissa; Tucker, Dolanda; Cain, [place] New orders have been placed for the above pt, DOB: 01/22/1961 Thanks

## 2024-03-16 ENCOUNTER — Ambulatory Visit: Payer: Self-pay | Admitting: Nurse Practitioner

## 2024-03-16 ENCOUNTER — Encounter: Payer: Self-pay | Admitting: Nurse Practitioner

## 2024-03-16 DIAGNOSIS — I10 Essential (primary) hypertension: Secondary | ICD-10-CM

## 2024-03-16 NOTE — Assessment & Plan Note (Addendum)
 Improved BP control under the care of [facility]. Current use of olmesartan  40mg , maxzide  37.5/25mg , and coreg  12.5mg  BID She never took hydralazine  Unable to tolerate amlodipine -myalgia Compliant with med doses and use of CPAP (she is to get new auto CPAP machine) BP Readings from Last 3 Encounters:  03/01/24 108/64  02/19/24 122/80  01/19/24 136/84    Advised to maintain appointment with cardiology and sleep specialist. Maintain current med doses F/up in 4months

## 2024-03-16 NOTE — Progress Notes (Signed)
 Virtual Visit via Video Note  I connected withNAME@ on 03/16/24 at  1:40 PM EST by a video enabled telemedicine application and verified that I am speaking with the correct person using two identifiers.  Location: Patient:Home Provider: Office Participants: patient and provider  I discussed the limitations of evaluation and management by telemedicine and the availability of in person appointments. I also discussed with the patient that there may be a patient responsible charge related to this service. The patient expressed understanding and agreed to proceed.  [id number] f/up  History of Present Illness:  HTN (hypertension) Improved BP control under the care of [facility]. Current use of olmesartan  40mg , maxzide  37.5/25mg , and coreg  12.5mg  BID She never took hydralazine  Unable to tolerate amlodipine -myalgia Compliant with med doses and use of CPAP (she is to get new auto CPAP machine) BP Readings from Last 3 Encounters:  03/01/24 108/64  02/19/24 122/80  01/19/24 136/84    Advised to maintain appointment with cardiology and sleep specialist. Maintain current med doses F/up in 4months    Observations/Objective: Physical Exam Nursing note reviewed.  Neurological:     Mental Status: She is alert and oriented to person, place, and time.     Assessment and Plan: Virlee was seen today for follow-up.  Diagnoses and all orders for this visit:  Primary hypertension   Follow Up Instructions: See instructions above   I discussed the assessment and treatment plan with the patient. The patient was provided an opportunity to ask questions and all were answered. The patient agreed with the plan and demonstrated an understanding of the instructions.   The patient was advised to call back or seek an in-person evaluation if the symptoms worsen or if the condition fails to improve as anticipated.  Roselie Mood, NP

## 2024-04-29 ENCOUNTER — Encounter (HOSPITAL_BASED_OUTPATIENT_CLINIC_OR_DEPARTMENT_OTHER): Admitting: Family

## 2024-05-03 ENCOUNTER — Ambulatory Visit

## 2024-07-14 ENCOUNTER — Encounter: Admitting: Nurse Practitioner
